# Patient Record
Sex: Female | Born: 1975
Health system: Southern US, Community
[De-identification: ages and names within clinical notes are randomized; demographics above are authoritative.]

## PROBLEM LIST (undated history)

## (undated) DIAGNOSIS — I10 Essential (primary) hypertension: Secondary | ICD-10-CM

## (undated) DIAGNOSIS — C9210 Chronic myeloid leukemia, BCR/ABL-positive, not having achieved remission: Secondary | ICD-10-CM

## (undated) DIAGNOSIS — E669 Obesity, unspecified: Secondary | ICD-10-CM

## (undated) DIAGNOSIS — L700 Acne vulgaris: Secondary | ICD-10-CM

## (undated) HISTORY — DX: Obesity, unspecified: E66.9

## (undated) HISTORY — DX: Acne vulgaris: L70.0

## (undated) HISTORY — DX: Essential (primary) hypertension: I10

## (undated) HISTORY — PX: BREAST BIOPSY: SHX20

---

## 2001-05-31 ENCOUNTER — Emergency Department (HOSPITAL_COMMUNITY): Admission: EM | Admit: 2001-05-31 | Discharge: 2001-05-31 | Payer: Self-pay | Admitting: Internal Medicine

## 2001-06-10 ENCOUNTER — Encounter (HOSPITAL_COMMUNITY): Admission: RE | Admit: 2001-06-10 | Discharge: 2001-07-10 | Payer: Self-pay | Admitting: Preventative Medicine

## 2001-07-10 ENCOUNTER — Other Ambulatory Visit: Admission: RE | Admit: 2001-07-10 | Discharge: 2001-07-10 | Payer: Self-pay | Admitting: Family Medicine

## 2001-07-17 ENCOUNTER — Encounter: Payer: Self-pay | Admitting: Preventative Medicine

## 2001-07-17 ENCOUNTER — Ambulatory Visit (HOSPITAL_COMMUNITY): Admission: RE | Admit: 2001-07-17 | Discharge: 2001-07-17 | Payer: Self-pay | Admitting: Preventative Medicine

## 2004-04-03 ENCOUNTER — Emergency Department (HOSPITAL_COMMUNITY): Admission: EM | Admit: 2004-04-03 | Discharge: 2004-04-04 | Payer: Self-pay | Admitting: Emergency Medicine

## 2005-12-18 ENCOUNTER — Ambulatory Visit: Payer: Self-pay | Admitting: Family Medicine

## 2005-12-23 ENCOUNTER — Emergency Department (HOSPITAL_COMMUNITY): Admission: EM | Admit: 2005-12-23 | Discharge: 2005-12-23 | Payer: Self-pay | Admitting: Emergency Medicine

## 2006-05-23 ENCOUNTER — Ambulatory Visit: Payer: Self-pay | Admitting: Family Medicine

## 2006-10-29 ENCOUNTER — Ambulatory Visit: Payer: Self-pay | Admitting: Family Medicine

## 2007-02-11 ENCOUNTER — Emergency Department (HOSPITAL_COMMUNITY): Admission: EM | Admit: 2007-02-11 | Discharge: 2007-02-11 | Payer: Self-pay | Admitting: Emergency Medicine

## 2007-11-06 ENCOUNTER — Emergency Department (HOSPITAL_COMMUNITY): Admission: EM | Admit: 2007-11-06 | Discharge: 2007-11-06 | Payer: Self-pay | Admitting: *Deleted

## 2007-11-12 ENCOUNTER — Ambulatory Visit: Payer: Self-pay | Admitting: Family Medicine

## 2007-11-12 ENCOUNTER — Other Ambulatory Visit: Admission: RE | Admit: 2007-11-12 | Discharge: 2007-11-12 | Payer: Self-pay | Admitting: Family Medicine

## 2007-11-12 ENCOUNTER — Encounter (INDEPENDENT_AMBULATORY_CARE_PROVIDER_SITE_OTHER): Payer: Self-pay | Admitting: *Deleted

## 2007-11-12 ENCOUNTER — Encounter: Payer: Self-pay | Admitting: Family Medicine

## 2007-11-12 LAB — CONVERTED CEMR LAB
BUN: 13 mg/dL (ref 6–23)
Basophils Relative: 0 % (ref 0–1)
Calcium: 9.3 mg/dL (ref 8.4–10.5)
Cholesterol: 145 mg/dL (ref 0–200)
Creatinine, Ser: 0.81 mg/dL (ref 0.40–1.20)
HDL: 51 mg/dL (ref >39)
LDL Cholesterol: 77 mg/dL (ref 0–99)
MCHC: 32.8 g/dL (ref 30.0–36.0)
Monocytes Relative: 5 % (ref 3–12)
Neutro Abs: 3.9 10*3/uL (ref 1.7–7.7)
Neutrophils Relative %: 63 % (ref 43–77)
Pap Smear: NORMAL
RBC: 3.94 M/uL (ref 3.87–5.11)
Triglycerides: 86 mg/dL (ref <150)
WBC: 6.2 10*3/uL (ref 4.0–10.5)

## 2007-11-14 ENCOUNTER — Encounter: Payer: Self-pay | Admitting: Family Medicine

## 2007-11-14 LAB — CONVERTED CEMR LAB: Chlamydia, DNA Probe: NEGATIVE

## 2008-02-17 ENCOUNTER — Ambulatory Visit: Payer: Self-pay | Admitting: Family Medicine

## 2008-02-27 ENCOUNTER — Encounter (INDEPENDENT_AMBULATORY_CARE_PROVIDER_SITE_OTHER): Payer: Self-pay | Admitting: *Deleted

## 2008-02-27 DIAGNOSIS — I10 Essential (primary) hypertension: Secondary | ICD-10-CM | POA: Insufficient documentation

## 2008-02-27 DIAGNOSIS — L708 Other acne: Secondary | ICD-10-CM

## 2008-02-27 HISTORY — DX: Essential (primary) hypertension: I10

## 2008-05-18 ENCOUNTER — Encounter: Payer: Self-pay | Admitting: Family Medicine

## 2008-05-18 LAB — CONVERTED CEMR LAB: hCG, Beta Chain, Quant, S: 80330.2 milliintl units/mL

## 2008-09-03 ENCOUNTER — Ambulatory Visit: Payer: Self-pay | Admitting: Family Medicine

## 2008-09-03 DIAGNOSIS — R5381 Other malaise: Secondary | ICD-10-CM

## 2008-09-03 DIAGNOSIS — R079 Chest pain, unspecified: Secondary | ICD-10-CM | POA: Insufficient documentation

## 2008-09-03 DIAGNOSIS — R5383 Other fatigue: Secondary | ICD-10-CM

## 2008-09-29 ENCOUNTER — Ambulatory Visit: Payer: Self-pay | Admitting: Family Medicine

## 2008-09-29 DIAGNOSIS — R51 Headache: Secondary | ICD-10-CM

## 2008-09-29 DIAGNOSIS — R519 Headache, unspecified: Secondary | ICD-10-CM | POA: Insufficient documentation

## 2008-10-04 ENCOUNTER — Ambulatory Visit: Payer: Self-pay | Admitting: Cardiology

## 2008-10-04 ENCOUNTER — Encounter: Payer: Self-pay | Admitting: Family Medicine

## 2009-03-30 ENCOUNTER — Telehealth: Payer: Self-pay | Admitting: Family Medicine

## 2009-08-04 ENCOUNTER — Telehealth: Payer: Self-pay | Admitting: Family Medicine

## 2009-08-08 ENCOUNTER — Ambulatory Visit: Payer: Self-pay | Admitting: Family Medicine

## 2009-08-08 DIAGNOSIS — N760 Acute vaginitis: Secondary | ICD-10-CM

## 2009-08-09 ENCOUNTER — Encounter: Payer: Self-pay | Admitting: Family Medicine

## 2009-08-09 LAB — CONVERTED CEMR LAB
Candida species: NEGATIVE
Chlamydia, DNA Probe: NEGATIVE
Gardnerella vaginalis: POSITIVE — AB

## 2009-08-14 DIAGNOSIS — E669 Obesity, unspecified: Secondary | ICD-10-CM

## 2009-09-26 ENCOUNTER — Encounter: Payer: Self-pay | Admitting: Family Medicine

## 2009-09-26 LAB — CONVERTED CEMR LAB
BUN: 12 mg/dL (ref 6–23)
Basophils Relative: 0 % (ref 0–1)
CO2: 24 meq/L (ref 19–32)
Chloride: 107 meq/L (ref 96–112)
Creatinine, Ser: 0.85 mg/dL (ref 0.40–1.20)
Eosinophils Absolute: 0.1 10*3/uL (ref 0.0–0.7)
Eosinophils Relative: 2 % (ref 0–5)
Glucose, Bld: 79 mg/dL (ref 70–99)
HDL: 43 mg/dL (ref >39)
Hemoglobin: 11.8 g/dL — ABNORMAL LOW (ref 12.0–15.0)
Lymphs Abs: 1.6 10*3/uL (ref 0.7–4.0)
MCHC: 31 g/dL (ref 30.0–36.0)
MCV: 96.7 fL (ref 78.0–100.0)
Monocytes Absolute: 0.4 10*3/uL (ref 0.1–1.0)
Monocytes Relative: 7 % (ref 3–12)
RBC: 3.94 M/uL (ref 3.87–5.11)
Total CHOL/HDL Ratio: 3.4
VLDL: 16 mg/dL (ref 0–40)

## 2009-10-04 ENCOUNTER — Ambulatory Visit: Payer: Self-pay | Admitting: Family Medicine

## 2009-10-04 ENCOUNTER — Other Ambulatory Visit: Admission: RE | Admit: 2009-10-04 | Discharge: 2009-10-04 | Payer: Self-pay | Admitting: Family Medicine

## 2009-11-17 ENCOUNTER — Telehealth: Payer: Self-pay | Admitting: Family Medicine

## 2009-11-29 ENCOUNTER — Emergency Department (HOSPITAL_COMMUNITY): Admission: EM | Admit: 2009-11-29 | Discharge: 2009-11-30 | Payer: Self-pay | Admitting: Emergency Medicine

## 2009-12-05 ENCOUNTER — Encounter (INDEPENDENT_AMBULATORY_CARE_PROVIDER_SITE_OTHER): Payer: Self-pay

## 2009-12-05 ENCOUNTER — Ambulatory Visit: Payer: Self-pay | Admitting: Family Medicine

## 2009-12-05 DIAGNOSIS — M542 Cervicalgia: Secondary | ICD-10-CM

## 2009-12-07 ENCOUNTER — Encounter: Payer: Self-pay | Admitting: Family Medicine

## 2009-12-07 ENCOUNTER — Ambulatory Visit (HOSPITAL_COMMUNITY): Admission: RE | Admit: 2009-12-07 | Discharge: 2009-12-07 | Payer: Self-pay | Admitting: Family Medicine

## 2009-12-27 ENCOUNTER — Encounter (HOSPITAL_COMMUNITY): Admission: RE | Admit: 2009-12-27 | Discharge: 2010-01-26 | Payer: Self-pay | Admitting: Family Medicine

## 2010-01-09 ENCOUNTER — Encounter: Payer: Self-pay | Admitting: Family Medicine

## 2010-11-15 ENCOUNTER — Telehealth: Payer: Self-pay | Admitting: Family Medicine

## 2010-12-01 ENCOUNTER — Telehealth: Payer: Self-pay | Admitting: Family Medicine

## 2010-12-10 LAB — CONVERTED CEMR LAB
BUN: 13 mg/dL (ref 6–23)
Calcium: 8.7 mg/dL (ref 8.4–10.5)
Cholesterol: 147 mg/dL (ref 0–200)
Creatinine, Ser: 0.81 mg/dL (ref 0.40–1.20)
Glucose, Bld: 81 mg/dL (ref 70–99)
HCT: 36.6 % (ref 36.0–46.0)
Hemoglobin: 11.6 g/dL — ABNORMAL LOW (ref 12.0–15.0)
MCHC: 31.7 g/dL (ref 30.0–36.0)
MCV: 95.6 fL (ref 78.0–100.0)
RBC: 3.83 M/uL — ABNORMAL LOW (ref 3.87–5.11)

## 2010-12-11 ENCOUNTER — Emergency Department (HOSPITAL_COMMUNITY)
Admission: EM | Admit: 2010-12-11 | Discharge: 2010-12-11 | Payer: Self-pay | Source: Home / Self Care | Admitting: Emergency Medicine

## 2010-12-14 NOTE — Letter (Signed)
Summary: Out of Work  [facility]  [address]   [place], [place] [address]   Phone: [phone]  Fax: [phone]    December 05, 2009   Employee:  SERYNA MAREK Ninamarie Keel    To Whom It May Concern:   For Medical reasons, please excuse the above named employee from work for the following dates:  Start:   11/29/09  End:   12/12/09 to return with no restrictions  If you need additional information, please feel free to contact our office.         Sincerely,    Milus Mallick. Lodema Hong, MD

## 2010-12-14 NOTE — Progress Notes (Signed)
  Phone Note Call from Patient   Summary of Call: [facility] requesting Ibuprofen 800 mg 1 two times a day prn Initial call taken by: Everitt Amber,  November 17, 2009 4:35 PM  Follow-up for Phone Call        script sent in as requested for menstrualcramps Follow-up by: Syliva Overman MD,  November 17, 2009 5:18 PM    New/Updated Medications: IBUPROFEN 800 MG TABS (IBUPROFEN) Take 1 tablet by mouth two times a day as needed Prescriptions: IBUPROFEN 800 MG TABS (IBUPROFEN) Take 1 tablet by mouth two times a day as needed  #40 x 1   Entered and Authorized by:   Syliva Overman MD   Signed by:   Syliva Overman MD on 11/17/2009   Method used:   Electronically to        [facility], [facility] (retail)       [address]       [place], [place]  [address]       Ph: [phone]       Fax: [phone]   RxID:   [id number]

## 2010-12-14 NOTE — Progress Notes (Signed)
Summary: physical therapy  physical therapy   Imported By: Lind Guest 01/09/2010 09:14:19  _____________________________________________________________________  External Attachment:    Type:   Image     Comment:   External Document

## 2010-12-14 NOTE — Letter (Signed)
Summary: Out of Work  [facility]  [address]   [place], [place] [address]   Phone: [phone]  Fax: [phone]    December 05, 2009   Employee:  KIFFANY SCHELLING SIMPSON    To Whom It May Concern:   For Medical reasons, please excuse the above named employee from work for the following dates:  Start:   11/29/2009  End:   12/12/2009  If you need additional information, please feel free to contact our office.         Sincerely,    Milus Mallick. Lodema Hong, M.D.

## 2010-12-14 NOTE — Progress Notes (Signed)
Summary: mmr2  Phone Note Call from Patient   Summary of Call: wants to know do we give the mmr2 shot jaime said to tell her to call back monday so i did Initial call taken by: Lind Guest,  November 15, 2010 2:38 PM  Follow-up for Phone Call        If she requires this it can be given here,pls find out why she needs it , then let me know Follow-up by: Syliva Overman MD,  November 22, 2010 4:48 AM  Additional Follow-up for Phone Call Additional follow up Details #1::        called patient, left message Additional Follow-up by: Adella Hare LPN,  November 22, 2010 9:36 AM  New Problems: NEED PROPH VACC W/MEASLES-MUMPS-RUBELLA VACCINE (ICD-V06.4)   Additional Follow-up for Phone Call Additional follow up Details #2::    needs mmr2 or titer for school Follow-up by: Adella Hare LPN,  November 22, 2010 9:43 AM  Additional Follow-up for Phone Call Additional follow up Details #3:: Details for Additional Follow-up Action Taken: pls order MMR titer on pt, blood test, let her know if the titer shows she is immune she will no need #2 Additional Follow-up by: Syliva Overman MD,  November 22, 2010 10:22 AM  New Problems: NEED PROPH [facility] W/MEASLES-MUMPS-RUBELLA VACCINE (ICD-V06.4)  test ordered and called patient, left message Adella Hare LPN  November 22, 2010 4:18 PM  called patient, left message Adella Hare LPN  November 27, 2010 8:40 AM  patient aware Adella Hare LPN  November 28, 2010 8:35 AM

## 2010-12-14 NOTE — Letter (Signed)
Summary: FMLA PAPER  FMLA PAPER   Imported By: Lind Guest 12/07/2009 09:07:22  _____________________________________________________________________  External Attachment:    Type:   Image     Comment:   External Document

## 2010-12-14 NOTE — Progress Notes (Signed)
Summary: RESULTS  Phone Note Call from Patient   Summary of Call: WANTS HER RESULTS THE TITER SEND TO [phone] [facility] [employer]  Initial call taken by: Lind Guest,  December 01, 2010 10:06 AM  Follow-up for Phone Call        faxed as requested Follow-up by: Adella Hare LPN,  December 01, 2010 2:22 PM
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
## 2011-01-02 ENCOUNTER — Ambulatory Visit: Payer: Self-pay | Admitting: Family Medicine

## 2011-01-02 ENCOUNTER — Encounter: Payer: Self-pay | Admitting: Family Medicine

## 2011-01-09 NOTE — Letter (Signed)
Summary: 1st no show letter  1st no show letter   Imported By: Lind Guest 01/04/2011 11:43:59  _____________________________________________________________________  External Attachment:    Type:   Image     Comment:   External Document

## 2011-01-28 LAB — URINALYSIS, ROUTINE W REFLEX MICROSCOPIC
Bilirubin Urine: NEGATIVE
Glucose, UA: NEGATIVE mg/dL
Leukocytes, UA: NEGATIVE
Protein, ur: NEGATIVE mg/dL

## 2011-01-28 LAB — URINE MICROSCOPIC-ADD ON

## 2011-02-05 ENCOUNTER — Other Ambulatory Visit: Payer: Self-pay | Admitting: Family Medicine

## 2011-02-08 ENCOUNTER — Other Ambulatory Visit: Payer: Self-pay

## 2011-03-05 ENCOUNTER — Encounter: Payer: Self-pay | Admitting: Family Medicine

## 2011-03-06 ENCOUNTER — Encounter: Payer: Self-pay | Admitting: Family Medicine

## 2011-03-07 ENCOUNTER — Encounter: Payer: Self-pay | Admitting: Family Medicine

## 2011-03-27 NOTE — Assessment & Plan Note (Signed)
[facility]                       [place] CARDIOLOGY OFFICE NOTE   MELVIN, MARMO                     MRN:          [id number]  DATE:10/04/2008                            DOB:          10/06/1976    I was asked by Dr. Syliva Overman to evaluate Lamonte Richer for her  cardiovascular risk with a family history of coronary artery disease and  hypertension.   HISTORY OF PRESENT ILLNESS:  Marcia Hahn is a delightful 35 year old  African American female.  She has a history of hypertension, well  controlled with HCTZ.  She is extremely knowledgeable with diet, taking  in about 750 mg of sodium a day on the 2000-calorie diet.  She balances  her weight, which has been stable for some time.  She tries to exercise  when she can, but she has very little time.   She does not smoke.  She does not have hyperlipidemia.  In fact, her  last cholesterol was 147 with a LDL of 82.  Her HDL is borderline low at  49 and her triglycerides were normal as was her VLDL.   Her family history is pertinent for her father having an MI at 17, which  was fatal.  It was his first event.  However, he smoked.  He was  overweight.  He had hypertension.  He probably had high cholesterol.   PAST MEDICAL HISTORY:  She has no known drug allergies.   CURRENT MEDICATIONS:  HCTZ 25 mg per day.   HOSPITALIZATION:  She had childbirth with twins.  She has had no other  surgeries.   FAMILY HISTORY:  Above.   SOCIAL HISTORY:  She is not married.  She has 2 children.  She works at  [employer].   REVIEW OF SYSTEMS:  She wears glasses.  She has no dentures.  Her GI,  GU, gynecological, and endocrine are all negative.  Cardiorespiratory as  above with no symptoms of angina or ischemia.   PHYSICAL EXAMINATION:  VITAL SIGNS:  Her blood pressure is 128/80 and  her pulse is 80 and regular.  She is 5 feet 4 inches and weighs 175  pounds.  GENERAL:  She is extremely pleasant.   She is in no acute distress.  SKIN:  Warm and dry.  HEENT:  Completely normal.  NECK:  Carotid upstrokes are equal bilaterally without bruits.  No JVD.  Thyroid is not enlarged.  Trachea is midline.  LUNGS:  Clear to auscultation and percussion.  HEART:  Soft S1 and S2.  No gallop.  PMI is difficult to appreciate.  ABDOMEN:  Soft.  Good bowel sounds.  EXTREMITIES:  There is no cyanosis, clubbing, or edema.  Pulses are  intact.  NEUROLOGIC:  Intact.   Electrocardiogram is completely normal.   ASSESSMENT AND PLAN:  I have had a long talk with Ms. Simpson today.  Her current Framingham risk is low as is her Reynolds score.  I am not  sure if she really has a premature family history with her father having  multiple risk factors including being a smoker.  I have reassured her  today that with her healthy lifestyle, she should do very well.   Her current risk is certainly less than 5%, probably in the 2-3% range  over the next 10 years.  Until she becomes 53 or older, assuming all  other risk factors remain stable, her risk will remain low.  She felt  better by our conversation.   I did advise her to walk or do some sort of cardio activity 3 hours a  week.   We will see her back on a p.r.n. basis.   Please note that if her HDL drops further, switching her from HCTZ to an  ACE inhibitor or calcium-channel blocker may indicate some of this.     Thomas C. Daleen Squibb, MD, [facility]  Electronically Signed    TCW/MedQ  DD: 10/04/2008  DT: 10/05/2008  Job #: [id number]   cc:   Milus Mallick. Lodema Hong, M.D.

## 2011-04-16 ENCOUNTER — Encounter: Payer: Self-pay | Admitting: Family Medicine

## 2011-05-28 ENCOUNTER — Encounter: Payer: Self-pay | Admitting: Family Medicine

## 2011-06-05 ENCOUNTER — Other Ambulatory Visit (HOSPITAL_COMMUNITY)
Admission: RE | Admit: 2011-06-05 | Discharge: 2011-06-05 | Disposition: A | Discharge status: Home / Self Care | Payer: BC Managed Care – PPO | Source: Ambulatory Visit | Attending: Family Medicine | Admitting: Family Medicine

## 2011-06-05 ENCOUNTER — Ambulatory Visit (INDEPENDENT_AMBULATORY_CARE_PROVIDER_SITE_OTHER): Payer: BC Managed Care – PPO | Admitting: Family Medicine

## 2011-06-05 ENCOUNTER — Encounter: Payer: Self-pay | Admitting: Family Medicine

## 2011-06-05 VITALS — BP 140/80 | HR 75 | Resp 16 | Ht 65.0 in | Wt 185.0 lb

## 2011-06-05 DIAGNOSIS — Z13220 Encounter for screening for lipoid disorders: Secondary | ICD-10-CM

## 2011-06-05 DIAGNOSIS — R5383 Other fatigue: Secondary | ICD-10-CM

## 2011-06-05 DIAGNOSIS — Z139 Encounter for screening, unspecified: Secondary | ICD-10-CM

## 2011-06-05 DIAGNOSIS — Z124 Encounter for screening for malignant neoplasm of cervix: Secondary | ICD-10-CM

## 2011-06-05 DIAGNOSIS — Z0000 Encounter for general adult medical examination without abnormal findings: Secondary | ICD-10-CM

## 2011-06-05 DIAGNOSIS — R5381 Other malaise: Secondary | ICD-10-CM

## 2011-06-05 DIAGNOSIS — Z01419 Encounter for gynecological examination (general) (routine) without abnormal findings: Secondary | ICD-10-CM | POA: Insufficient documentation

## 2011-06-05 DIAGNOSIS — E663 Overweight: Secondary | ICD-10-CM

## 2011-06-05 DIAGNOSIS — R7301 Impaired fasting glucose: Secondary | ICD-10-CM

## 2011-06-05 DIAGNOSIS — I10 Essential (primary) hypertension: Secondary | ICD-10-CM

## 2011-06-05 DIAGNOSIS — L708 Other acne: Secondary | ICD-10-CM

## 2011-06-05 MED ORDER — POTASSIUM CHLORIDE 10 MEQ PO TBCR: 10.0000 meq | EXTENDED_RELEASE_TABLET | Freq: Every day | ORAL | Status: DC

## 2011-06-05 MED ORDER — IBUPROFEN 800 MG PO TABS: 800.0000 mg | ORAL_TABLET | Freq: Three times a day (TID) | ORAL | Status: DC

## 2011-06-05 MED ORDER — HYDROCHLOROTHIAZIDE 12.5 MG PO CAPS: 12.5000 mg | ORAL_CAPSULE | Freq: Every day | ORAL | Status: DC

## 2011-06-05 MED ORDER — HYDROQUINONE 2 % EX CREA: TOPICAL_CREAM | Freq: Two times a day (BID) | CUTANEOUS | Status: DC

## 2011-06-05 NOTE — Patient Instructions (Addendum)
F/u in December.  CBC, chem 7, HBA1C, lipids , tSH and Vit D today.  pls follow a 1500 cal diet and plan to eat 3 meals and 2 "small meals" eg carrots and apples every day. Drink 64 ounces water daily.  It is important that you exercise regularly at least 45  minutes 5 times a week. If you develop chest pain, have severe difficulty breathing, or feel very tired, stop exercising immediately and seek medical attention   You will be referred for individual counselling for weight loss.  New medication for your blood pressure and also for the dark spots on your face  Weight loss goal is 10 pounds

## 2011-06-24 NOTE — Assessment & Plan Note (Signed)
Deteriorated , with hyperpigmented lesions, med prescribed

## 2011-06-24 NOTE — Assessment & Plan Note (Signed)
Deteriorated. Patient re-educated about  the importance of commitment to a  minimum of 150 minutes of exercise per week. The importance of healthy food choices with portion control discussed. Encouraged to start a food diary, count calories and to consider  joining a support group. Sample diet sheets offered. Goals set by the patient for the next several months.    

## 2011-06-24 NOTE — Progress Notes (Signed)
  Subjective:    Patient ID: Marcia Hahn, female    DOB: 1976/10/07, 35 y.o.   MRN: [id number]  HPI The PT is here for annual  exam and re-evaluation of chronic medical conditions, medication management and review of any available recent lab and radiology data.  Preventive health is updated, specifically  Cancer screening and Immunization.   Questions or concerns regarding consultations or procedures which the PT has had in the interim are  addressed. The PT denies any adverse reactions to current medications since the last visit.  There are no new concerns.  Concerned about dark areas on her face where she has traumatized the areas when she has had outbreak of her acne. She is also concerned about her weight and intends to reverse the weight gain trend      Review of Systems Denies recent fever or chills. Denies sinus pressure, nasal congestion, ear pain or sore throat. Denies chest congestion, productive cough or wheezing. Denies chest pains, palpitations and leg swelling Denies abdominal pain, nausea, vomiting,diarrhea or constipation.   Denies dysuria, frequency, hesitancy or incontinence. Denies joint pain, swelling and limitation in mobility. Denies headaches, seizures, numbness, or tingling. Denies depression, anxiety or insomnia.       Objective:   Physical Exam Pleasant well nourished female, alert and oriented x 3, in no cardio-pulmonary distress. Afebrile. HEENT No facial trauma or asymetry. Sinuses non tender.  EOMI, PERTL, fundoscopic exam is normal, no hemorhage or exudate.  External ears normal, tympanic membranes clear. Oropharynx moist, no exudate, good dentition. Neck: supple, no adenopathy,JVD or thyromegaly.No bruits.  Chest: Clear to ascultation bilaterally.No crackles or wheezes. Non tender to palpation  Breast: No asymetry,no masses. No nipple discharge or inversion. No axillary or supraclavicular adenopathy  Cardiovascular system; Heart  sounds normal,  S1 and  S2 ,no S3.  No murmur, or thrill. Apical beat not displaced Peripheral pulses normal.  Abdomen: Soft, non tender, no organomegaly or masses. No bruits. Bowel sounds normal. No guarding, tenderness or rebound.   GU: External genitalia normal. No lesions. Vaginal canal normal.No discharge. Uterus normal size, no adnexal masses, no cervical motion or adnexal tenderness.  Musculoskeletal exam: Full ROM of spine, hips , shoulders and knees. No deformity ,swelling or crepitus noted. No muscle wasting or atrophy.   Neurologic: Cranial nerves 2 to 12 intact. Power, tone ,sensation and reflexes normal throughout. No disturbance in gait. No tremor.  Skin: Intact, no ulceration, erythema , scaling or rash noted. Hyperpigmented macular lesions on face in scars from acne Psych; Normal mood and affect. Judgement and concentration normal        Assessment & Plan:

## 2011-06-24 NOTE — Assessment & Plan Note (Signed)
Medication compliance addressed. Commitment to regular exercise and healthy  food choices, with portion control discussed. DASH diet and low fat diet discussed and literature offered. Changes in medication made at this visit.  

## 2011-06-30 LAB — CBC WITH DIFFERENTIAL/PLATELET
Eosinophils Absolute: 0.1 10*3/uL (ref 0.0–0.7)
Eosinophils Relative: 2 % (ref 0–5)
Hemoglobin: 11.5 g/dL — ABNORMAL LOW (ref 12.0–15.0)
Lymphocytes Relative: 28 % (ref 12–46)
MCHC: 31.9 g/dL (ref 30.0–36.0)
Monocytes Absolute: 0.3 10*3/uL (ref 0.1–1.0)
Monocytes Relative: 6 % (ref 3–12)
Neutro Abs: 3.3 10*3/uL (ref 1.7–7.7)
Neutrophils Relative %: 65 % (ref 43–77)
RDW: 14.3 % (ref 11.5–15.5)
WBC: 5.1 10*3/uL (ref 4.0–10.5)

## 2011-07-01 LAB — BASIC METABOLIC PANEL
CO2: 25 mEq/L (ref 19–32)
Chloride: 105 mEq/L (ref 96–112)
Potassium: 4 mEq/L (ref 3.5–5.3)
Sodium: 138 mEq/L (ref 135–145)

## 2011-07-01 LAB — LIPID PANEL
Cholesterol: 153 mg/dL (ref 0–200)
Triglycerides: 104 mg/dL (ref <150)

## 2011-07-02 ENCOUNTER — Telehealth: Payer: Self-pay | Admitting: Family Medicine

## 2011-07-02 DIAGNOSIS — I10 Essential (primary) hypertension: Secondary | ICD-10-CM

## 2011-07-02 DIAGNOSIS — L708 Other acne: Secondary | ICD-10-CM

## 2011-07-02 MED ORDER — HYDROCHLOROTHIAZIDE 12.5 MG PO CAPS: 12.5000 mg | ORAL_CAPSULE | Freq: Every day | ORAL | Status: DC

## 2011-07-02 MED ORDER — HYDROQUINONE 2 % EX CREA: TOPICAL_CREAM | Freq: Two times a day (BID) | CUTANEOUS | Status: DC

## 2011-07-02 NOTE — Telephone Encounter (Signed)
Was sent in last month. Resent in again

## 2011-07-09 ENCOUNTER — Ambulatory Visit (INDEPENDENT_AMBULATORY_CARE_PROVIDER_SITE_OTHER): Payer: BC Managed Care – PPO | Admitting: Family Medicine

## 2011-07-09 ENCOUNTER — Encounter: Payer: Self-pay | Admitting: Family Medicine

## 2011-07-09 VITALS — BP 120/70 | HR 87 | Ht 66.0 in | Wt 189.0 lb

## 2011-07-09 DIAGNOSIS — L259 Unspecified contact dermatitis, unspecified cause: Secondary | ICD-10-CM

## 2011-07-09 DIAGNOSIS — E663 Overweight: Secondary | ICD-10-CM

## 2011-07-09 DIAGNOSIS — L309 Dermatitis, unspecified: Secondary | ICD-10-CM

## 2011-07-09 DIAGNOSIS — I10 Essential (primary) hypertension: Secondary | ICD-10-CM

## 2011-07-09 MED ORDER — PREDNISONE (PAK) 5 MG PO TABS: 5.0000 mg | ORAL_TABLET | ORAL | Status: DC

## 2011-07-09 NOTE — Patient Instructions (Addendum)
F/u as before.  You have dermatitis, which  I think is due to excessive sunlight, theerfore you need to avoid and use sun screen  Prednisone is being prescribed  Use my fit pal for calorie counting  Call for referral to allergist if you desire

## 2011-07-21 NOTE — Assessment & Plan Note (Signed)
Unchanged. Patient re-educated about  the importance of commitment to a  minimum of 150 minutes of exercise per week. The importance of healthy food choices with portion control discussed. Encouraged to start a food diary, count calories and to consider  joining a support group. Sample diet sheets offered. Goals set by the patient for the next several months.    

## 2011-07-21 NOTE — Progress Notes (Signed)
  Subjective:    Patient ID: Marcia Hahn, female    DOB: 1976/01/04, 35 y.o.   MRN: [id number]  HPI 1 week h/o rash to both arms as well as the legs, recently [place] the beach with excessive sun exposure. No new toiletries or detergents. No fever, chills or drainage from the rash. Has noted similar rash when in the sun in the past. Exercising, but no commitment to calorie restriction with resultant weight gain.     Review of Systems See HPI Denies recent fever or chills. Denies sinus pressure, nasal congestion, ear pain or sore throat. Denies chest congestion, productive cough or wheezing. Denies chest pains, palpitations and leg swelling Denies abdominal pain, nausea, vomiting,diarrhea or constipation.         Objective:   Physical Exam Patient alert and oriented and in no cardiopulmonary distress.  HEENT: No facial asymmetry, EOMI,  Neck supple no adenopathy.  Chest: Clear to auscultation bilaterally.  CVS: S1, S2 no murmurs, no S3.  ABD: Soft non tender. Bowel sounds normal.  Ext: No edema  MS: Adequate ROM spine, shoulders, hips and knees.  Skin: Intact,maculopapular rash on extremities      Assessment & Plan:

## 2011-07-21 NOTE — Assessment & Plan Note (Signed)
Controlled, no change in medication  

## 2011-07-21 NOTE — Assessment & Plan Note (Signed)
Sun sensitivity by history with rash, prednisone dose pack and avoidance

## 2011-09-12 ENCOUNTER — Telehealth: Payer: Self-pay | Admitting: Family Medicine

## 2011-09-13 NOTE — Telephone Encounter (Signed)
Returned call, no answer.

## 2011-09-13 NOTE — Telephone Encounter (Signed)
Patient wants referral to gyne to have remove iud, family tree okay

## 2011-09-14 ENCOUNTER — Other Ambulatory Visit: Payer: Self-pay | Admitting: Family Medicine

## 2011-09-14 NOTE — Telephone Encounter (Signed)
pls refer, referral entered

## 2011-09-14 NOTE — Telephone Encounter (Signed)
Pt was referred to dr. Robinette Haines office. They will call pt with appt and time.

## 2011-10-30 ENCOUNTER — Encounter: Payer: Self-pay | Admitting: Family Medicine

## 2011-11-01 ENCOUNTER — Ambulatory Visit: Payer: BC Managed Care – PPO | Admitting: Family Medicine

## 2012-03-03 ENCOUNTER — Other Ambulatory Visit: Payer: Self-pay | Admitting: Family Medicine

## 2012-04-10 ENCOUNTER — Other Ambulatory Visit: Payer: Self-pay | Admitting: Family Medicine

## 2012-04-14 ENCOUNTER — Ambulatory Visit (INDEPENDENT_AMBULATORY_CARE_PROVIDER_SITE_OTHER): Payer: BC Managed Care – PPO | Admitting: Family Medicine

## 2012-04-14 ENCOUNTER — Encounter: Payer: Self-pay | Admitting: Family Medicine

## 2012-04-14 VITALS — BP 140/90 | HR 80 | Resp 14 | Ht 66.0 in | Wt 191.1 lb

## 2012-04-14 DIAGNOSIS — R5383 Other fatigue: Secondary | ICD-10-CM

## 2012-04-14 DIAGNOSIS — Z125 Encounter for screening for malignant neoplasm of prostate: Secondary | ICD-10-CM

## 2012-04-14 DIAGNOSIS — H538 Other visual disturbances: Secondary | ICD-10-CM

## 2012-04-14 DIAGNOSIS — Z139 Encounter for screening, unspecified: Secondary | ICD-10-CM

## 2012-04-14 DIAGNOSIS — R5381 Other malaise: Secondary | ICD-10-CM

## 2012-04-14 DIAGNOSIS — E663 Overweight: Secondary | ICD-10-CM

## 2012-04-14 DIAGNOSIS — R7301 Impaired fasting glucose: Secondary | ICD-10-CM

## 2012-04-14 DIAGNOSIS — R0989 Other specified symptoms and signs involving the circulatory and respiratory systems: Secondary | ICD-10-CM | POA: Insufficient documentation

## 2012-04-14 DIAGNOSIS — I10 Essential (primary) hypertension: Secondary | ICD-10-CM

## 2012-04-14 MED ORDER — IBUPROFEN 800 MG PO TABS: 800.0000 mg | ORAL_TABLET | Freq: Three times a day (TID) | ORAL | Status: DC

## 2012-04-14 NOTE — Progress Notes (Signed)
  Subjective:    Patient ID: Marcia Hahn, female    DOB: 07-10-1976, 36 y.o.   MRN: [id number]  HPI Ptwas in her ususal state of health up until she was told in April during a routine eye exam that she has papilededma. States there is some family h/o glaucoma. States she has since had a brain scan which was negative, and has been recommended to a neurologist, however she wants to see a Doc at another facility and is here for help with this. Records from the opthalmologist support this history, and she is being referred to a tertiary center for a 2nd opinion and further evaluation and treatment as deemed necessary. She has otherwise been well.There is no known family h/o neurologic disease or visual disturbance   Review of Systems    See HPI Denies recent fever or chills. Denies sinus pressure, nasal congestion, ear pain or sore throat. Denies chest congestion, productive cough or wheezing. Denies chest pains, palpitations and leg swelling Denies abdominal pain, nausea, vomiting,diarrhea or constipation.   Denies dysuria, frequency, hesitancy or incontinence. Denies joint pain, swelling and limitation in mobility. Denies headaches, seizures, numbness, or tingling. Denies depression, anxiety or insomnia. Denies skin break down or rash.      Objective:   Physical Exam Patient alert and oriented and in no cardiopulmonary distress.  HEENT: No facial asymmetry, EOMI, no sinus tenderness,  oropharynx pink and moist.  Neck supple no adenopathy.Bruit  Chest: Clear to auscultation bilaterally.  CVS: S1, S2 no murmurs, no S3.  ABD: Soft non tender. Bowel sounds normal.  Ext: No edema  MS: Adequate ROM spine, shoulders, hips and knees.  Skin: Intact, no ulcerations or rash noted.  Psych: Good eye contact, normal affect. Memory intact not anxious or depressed appearing.  CNS: CN 2-12 intact, power, tone and sensation normal throughout.        Assessment & Plan:

## 2012-04-14 NOTE — Patient Instructions (Addendum)
CPE end July  Please take weekly vit D  Cbc, fasting chem 7 , lipid, , HBa1C and TSH end July bEFORE visit.  You are referred for an Ultrasound of the arteries in your neck to ensure circulation is good, pls schedule your own appointment per your directions  You will be referred to [facility] for 2nd opinion on diagnosis and further management of papiledema  It is important that you exercise regularly at least 30 minutes 5 times a week. If you develop chest pain, have severe difficulty breathing, or feel very tired, stop exercising immediately and seek medical attention  .A healthy diet is rich in fruit, vegetables and whole grains. Poultry fish, nuts and beans are a healthy choice for protein rather then red meat. A low sodium diet and drinking 64 ounces of water daily is generally recommended. Oils and sweet should be limited. Carbohydrates especially for those who are diabetic or overweight, should be limited to 34-45 gram per meal. It is important to eat on a regular schedule, at least 3 times daily. Snacks should be primarily fruits, vegetables or nuts.

## 2012-04-22 ENCOUNTER — Other Ambulatory Visit (HOSPITAL_COMMUNITY): Payer: BC Managed Care – PPO

## 2012-04-22 ENCOUNTER — Ambulatory Visit (HOSPITAL_COMMUNITY)
Admission: RE | Admit: 2012-04-22 | Discharge: 2012-04-22 | Disposition: A | Discharge status: Home / Self Care | Payer: BC Managed Care – PPO | Source: Ambulatory Visit | Attending: Family Medicine | Admitting: Family Medicine

## 2012-04-22 DIAGNOSIS — I10 Essential (primary) hypertension: Secondary | ICD-10-CM | POA: Insufficient documentation

## 2012-04-22 DIAGNOSIS — R0989 Other specified symptoms and signs involving the circulatory and respiratory systems: Secondary | ICD-10-CM | POA: Insufficient documentation

## 2012-04-22 DIAGNOSIS — H538 Other visual disturbances: Secondary | ICD-10-CM | POA: Insufficient documentation

## 2012-05-02 NOTE — Assessment & Plan Note (Addendum)
Pt to have carotid doppler to further eval this, she will schedule her own appt per recommendations of her company

## 2012-05-02 NOTE — Assessment & Plan Note (Signed)
Controlled, no change in medication  

## 2012-05-02 NOTE — Assessment & Plan Note (Signed)
Recent dx of papiledema, with blurred vision and normal mRI brain, refer to tertiary center for further eval

## 2012-05-02 NOTE — Assessment & Plan Note (Signed)
Deteriorated. Patient re-educated about  the importance of commitment to a  minimum of 150 minutes of exercise per week. The importance of healthy food choices with portion control discussed. Encouraged to start a food diary, count calories and to consider  joining a support group. Sample diet sheets offered. Goals set by the patient for the next several months.    

## 2012-05-19 ENCOUNTER — Other Ambulatory Visit: Payer: Self-pay | Admitting: Family Medicine

## 2012-05-19 LAB — CBC WITH DIFFERENTIAL/PLATELET
Basophils Absolute: 0 10*3/uL (ref 0.0–0.1)
Eosinophils Absolute: 0.1 10*3/uL (ref 0.0–0.7)
Eosinophils Relative: 2 % (ref 0–5)
HCT: 30.6 % — ABNORMAL LOW (ref 36.0–46.0)
Lymphocytes Relative: 31 % (ref 12–46)
MCH: 27.9 pg (ref 26.0–34.0)
MCHC: 33 g/dL (ref 30.0–36.0)
MCV: 84.5 fL (ref 78.0–100.0)
Monocytes Absolute: 0.3 10*3/uL (ref 0.1–1.0)
Platelets: 321 10*3/uL (ref 150–400)
RDW: 16.2 % — ABNORMAL HIGH (ref 11.5–15.5)
WBC: 5.2 10*3/uL (ref 4.0–10.5)

## 2012-05-19 LAB — LIPID PANEL
LDL Cholesterol: 90 mg/dL (ref 0–99)
Triglycerides: 107 mg/dL (ref <150)

## 2012-05-19 LAB — TSH: TSH: 1.548 u[IU]/mL (ref 0.350–4.500)

## 2012-05-19 LAB — BASIC METABOLIC PANEL
BUN: 11 mg/dL (ref 6–23)
Calcium: 9 mg/dL (ref 8.4–10.5)
Chloride: 108 mEq/L (ref 96–112)
Creat: 0.78 mg/dL (ref 0.50–1.10)

## 2012-05-19 LAB — HEMOGLOBIN A1C: Hgb A1c MFr Bld: 6.1 % — ABNORMAL HIGH (ref <5.7)

## 2012-05-20 LAB — IRON: Iron: 38 ug/dL — ABNORMAL LOW (ref 42–145)

## 2012-06-02 ENCOUNTER — Other Ambulatory Visit (HOSPITAL_COMMUNITY)
Admission: RE | Admit: 2012-06-02 | Discharge: 2012-06-02 | Disposition: A | Discharge status: Home / Self Care | Payer: BC Managed Care – PPO | Source: Ambulatory Visit | Attending: Family Medicine | Admitting: Family Medicine

## 2012-06-02 ENCOUNTER — Encounter: Payer: Self-pay | Admitting: Family Medicine

## 2012-06-02 ENCOUNTER — Ambulatory Visit (INDEPENDENT_AMBULATORY_CARE_PROVIDER_SITE_OTHER): Payer: BC Managed Care – PPO | Admitting: Family Medicine

## 2012-06-02 VITALS — BP 132/86 | HR 100 | Resp 18 | Ht 66.0 in | Wt 186.1 lb

## 2012-06-02 DIAGNOSIS — I10 Essential (primary) hypertension: Secondary | ICD-10-CM

## 2012-06-02 DIAGNOSIS — R5381 Other malaise: Secondary | ICD-10-CM

## 2012-06-02 DIAGNOSIS — Z01419 Encounter for gynecological examination (general) (routine) without abnormal findings: Secondary | ICD-10-CM

## 2012-06-02 DIAGNOSIS — H4710 Unspecified papilledema: Secondary | ICD-10-CM

## 2012-06-02 NOTE — Patient Instructions (Addendum)
F/u in 3.5 month  Vit D, hBA1C, cbc , iron and ferritin level before visit  Please keep appt with neurologist regarding papilledema  You may call if you wish to be referred to gyne re heavy menses following IUD insertion  Pls call and register for diabetes class, you are prediabetic , need to change eating, count carbs and exercise to reduce your risk of becoming diabetic.  You will need to start slow iron  (slow fe) one twice daily, this is over the counter, you are extremely iron deficient  You need to take one multivitamin once daily  You need to take OTC vit D 600IU once daily  Please start walking for 30 minutes 3 days per week, then increase slowly to 6 days per week as tolerated

## 2012-06-17 DIAGNOSIS — H4710 Unspecified papilledema: Secondary | ICD-10-CM | POA: Insufficient documentation

## 2012-06-17 NOTE — Progress Notes (Signed)
  Subjective:    Patient ID: Marcia Hahn, female    DOB: [birth year], 36 y.o.   MRN: [id number]  HPI The PT is here for annual exam and re-evaluation of chronic medical conditions, medication management and review of any available recent lab and radiology data.  Preventive health is updated, specifically  Cancer screening and Immunization.   Questions or concerns regarding consultations or procedures which the PT has had in the interim are  Addressed.Still has to keep appt with neurologist re dx of papiledema The PT denies any adverse reactions to current medications since the last visit.  There are no new concerns.  There are no specific complaints       Review of Systems See HPI Denies recent fever or chills. Denies sinus pressure, nasal congestion, ear pain or sore throat. Denies chest congestion, productive cough or wheezing. Denies chest pains, palpitations and leg swelling Denies abdominal pain, nausea, vomiting,diarrhea or constipation.   Denies dysuria, frequency, hesitancy or incontinence. Denies joint pain, swelling and limitation in mobility. Denies headaches, seizures, numbness, or tingling. Denies depression, anxiety or insomnia. Denies skin break down or rash.        Objective:   Physical Exam  Pleasant well nourished female, alert and oriented x 3, in no cardio-pulmonary distress. Afebrile. HEENT No facial trauma or asymetry. Sinuses non tender.  EOMI, PERTL, fundoscopic exam is normal, no hemorhage or exudate.  External ears normal, tympanic membranes clear. Oropharynx moist, no exudate, good dentition. Neck: supple, no adenopathy,JVD or thyromegaly.No bruits.  Chest: Clear to ascultation bilaterally.No crackles or wheezes. Non tender to palpation  Breast: No asymetry,no masses. No nipple discharge or inversion. No axillary or supraclavicular adenopathy  Cardiovascular system; Heart sounds normal,  S1 and  S2 ,no S3.  No murmur, or  thrill. Apical beat not displaced Peripheral pulses normal.  Abdomen: Soft, non tender, no organomegaly or masses. No bruits. Bowel sounds normal. No guarding, tenderness or rebound.   GU: External genitalia normal. No lesions. Vaginal canal normal.fishy discharge. Uterus normal size, no adnexal masses, no cervical motion or adnexal tenderness.  Musculoskeletal exam: Full ROM of spine, hips , shoulders and knees. No deformity ,swelling or crepitus noted. No muscle wasting or atrophy.   Neurologic: Cranial nerves 2 to 12 intact. Power, tone ,sensation and reflexes normal throughout. No disturbance in gait. No tremor.  Skin: Intact, no ulceration, erythema , scaling or rash noted. Pigmentation normal throughout  Psych; Normal mood and affect. Judgement and concentration normal       Assessment & Plan:

## 2012-07-07 DIAGNOSIS — H4710 Unspecified papilledema: Secondary | ICD-10-CM

## 2012-07-07 HISTORY — DX: Unspecified papilledema: H47.10

## 2012-07-27 ENCOUNTER — Encounter (HOSPITAL_COMMUNITY): Payer: Self-pay | Admitting: *Deleted

## 2012-07-27 ENCOUNTER — Emergency Department (HOSPITAL_COMMUNITY)
Admission: EM | Admit: 2012-07-27 | Discharge: 2012-07-28 | Disposition: A | Discharge status: Home / Self Care | Payer: BC Managed Care – PPO | Attending: Emergency Medicine | Admitting: Emergency Medicine

## 2012-07-27 DIAGNOSIS — Z87891 Personal history of nicotine dependence: Secondary | ICD-10-CM | POA: Insufficient documentation

## 2012-07-27 DIAGNOSIS — H6120 Impacted cerumen, unspecified ear: Secondary | ICD-10-CM | POA: Insufficient documentation

## 2012-07-27 DIAGNOSIS — I10 Essential (primary) hypertension: Secondary | ICD-10-CM | POA: Insufficient documentation

## 2012-07-27 DIAGNOSIS — E669 Obesity, unspecified: Secondary | ICD-10-CM | POA: Insufficient documentation

## 2012-07-27 NOTE — ED Notes (Signed)
Pt c/o pain in her left ear x 1.5 weeks

## 2012-07-28 MED ORDER — NEOMYCIN-POLYMYXIN-HC 3.5-10000-1 OT SOLN
3.0000 [drp] | Freq: Once | OTIC | Status: AC
  Administered 2012-07-28: 3 [drp] via OTIC
  Filled 2012-07-28: qty 10

## 2012-07-28 MED ORDER — ANTIPYRINE-BENZOCAINE 5.4-1.4 % OT SOLN
OTIC | Status: AC
  Administered 2012-07-28: 2 [drp]
  Filled 2012-07-28: qty 10

## 2012-07-29 NOTE — ED Provider Notes (Signed)
History     CSN: [id number]  Arrival date & time 07/27/12  2149   First MD Initiated Contact with Patient 07/27/12 2345      Chief Complaint  Patient presents with  . Otalgia    (Consider location/radiation/quality/duration/timing/severity/associated sxs/prior treatment) HPI Comments: Marcia Hahn presents with left ear ache for the past 10 days.  She has been seen by a local urgent care center twice and diagnosed with left cerumen impaction.  She has had her left ear irrigated at both visits with reported no successful removal of the cerumen.  She has constant pressure in her ear with intermittent sharp stabs of pain.  She denies hearing loss, drainage from the ear, fevers, nasal congestion and sore throat.  She has treated her pain with topical "sweet oil" which gives minimal relief.  The history is provided by the patient.    Past Medical History  Diagnosis Date  . Acne vulgaris   . Mild obesity   . Hypertension     Past Surgical History  Procedure Date  . Cesarean section     Family History  Problem Relation Age of Onset  . GER disease Mother   . Diabetes Mother   . Hypertension Mother   . Heart attack Father 61    History  Substance Use Topics  . Smoking status: Former Games developer  . Smokeless tobacco: Not on file  . Alcohol Use: Yes    OB History    Grav Para Term Preterm Abortions TAB SAB Ect Mult Living                  Review of Systems  Constitutional: Negative for fever.  HENT: Positive for ear pain. Negative for hearing loss, congestion, sore throat, neck pain, sinus pressure, tinnitus and ear discharge.   Eyes: Negative.   Respiratory: Negative.   Cardiovascular: Negative.   Gastrointestinal: Negative.   Musculoskeletal: Negative.   Skin: Negative for rash.  Neurological: Negative for dizziness, light-headedness, numbness and headaches.  Hematological: Negative.   Psychiatric/Behavioral: Negative.     Allergies  Review of patient's  allergies indicates no known allergies.  Home Medications   Current Outpatient Rx  Name Route Sig Dispense Refill  . HYDROCHLOROTHIAZIDE 12.5 MG PO CAPS Oral Take 1 capsule (12.5 mg total) by mouth daily. 30 capsule 5    D/c triamterene effective 06/05/2011  . IBUPROFEN 800 MG PO TABS Oral Take 1 tablet (800 mg total) by mouth 3 (three) times daily. Take 1 tablet by mouth three times a day for 10 days, then twice daily as needed 30 tablet 3    BP 136/85  Pulse 89  Temp 98.4 F (36.9 C) (Oral)  Resp 18  SpO2 100%  LMP 07/12/2012  Physical Exam  Constitutional: She is oriented to person, place, and time. She appears well-developed and well-nourished.  HENT:  Head: Normocephalic and atraumatic.  Right Ear: Tympanic membrane and ear canal normal.  Left Ear: No drainage or tenderness. No mastoid tenderness.  Nose: Mucosal edema and rhinorrhea present.  Mouth/Throat: Uvula is midline, oropharynx is clear and moist and mucous membranes are normal. No oropharyngeal exudate, posterior oropharyngeal edema, posterior oropharyngeal erythema or tonsillar abscesses.       Unable to visulize left TM due to cerumen which is deep,  Appears to be possibly lying against the TM, appears soft and light yellow in color.  External canal slightly edematous without erythema or purulent drainage.  Eyes: Conjunctivae normal are normal.  Pulmonary/Chest:  Effort normal. No respiratory distress. She has no wheezes. She has no rales.  Abdominal: Soft. There is no tenderness.  Musculoskeletal: Normal range of motion.  Neurological: She is alert and oriented to person, place, and time.  Skin: Skin is warm and dry. No rash noted.  Psychiatric: She has a normal mood and affect.    ED Course  EAR CERUMEN REMOVAL Performed by: Burgess Amor Authorized by: Burgess Amor Consent: Verbal consent obtained. Risks and benefits: risks, benefits and alternatives were discussed Consent given by: patient Patient identity  confirmed: verbally with patient Time out: Immediately prior to procedure a "time out" was called to verify the correct patient, procedure, equipment, support staff and site/side marked as required. Local anesthetic: proparacaine drops Anesthetic total: 4 drops Ceruminolytics applied: Ceruminolytics applied prior to the procedure. Location details: left ear Procedure type: irrigation Patient tolerance: Patient tolerated the procedure well with no immediate complications. Comments: Unable to mobilize the cerumen after h2o2 soak, then 4 flushes with warm tap water.  Pt did obtain pain relief with auralgan.   (including critical care time)  Labs Reviewed - No data to display No results found.   1. Cerumen impaction       MDM  Pt with otalgia and cerumen impaction.  Outer canal probably edematous secondary to irritation from flushes,  But will cover with cortisporin otic solution.  Also given auralgan for home use. Pt was referred to Dr. Suszanne Conners for resolution of this impaction.  Pt agrees to call in am for appt.        Burgess Amor, PA 07/29/12 1124

## 2012-07-31 NOTE — ED Provider Notes (Signed)
Medical screening examination/treatment/procedure(s) were performed by non-physician practitioner and as supervising physician I was immediately available for consultation/collaboration.  Sunnie Nielsen, MD 07/31/12 [phone]

## 2012-08-07 ENCOUNTER — Ambulatory Visit (INDEPENDENT_AMBULATORY_CARE_PROVIDER_SITE_OTHER): Payer: BC Managed Care – PPO | Admitting: Otolaryngology

## 2012-08-07 DIAGNOSIS — H66019 Acute suppurative otitis media with spontaneous rupture of ear drum, unspecified ear: Secondary | ICD-10-CM

## 2012-08-07 DIAGNOSIS — H903 Sensorineural hearing loss, bilateral: Secondary | ICD-10-CM

## 2012-09-03 ENCOUNTER — Other Ambulatory Visit: Payer: Self-pay

## 2012-09-03 DIAGNOSIS — I10 Essential (primary) hypertension: Secondary | ICD-10-CM

## 2012-09-03 MED ORDER — HYDROCHLOROTHIAZIDE 12.5 MG PO CAPS: 12.5000 mg | ORAL_CAPSULE | Freq: Every day | ORAL | Status: DC

## 2012-10-14 ENCOUNTER — Ambulatory Visit: Payer: BC Managed Care – PPO | Admitting: Family Medicine

## 2012-11-10 ENCOUNTER — Other Ambulatory Visit: Payer: Self-pay | Admitting: Family Medicine

## 2012-11-11 LAB — CBC
HCT: 35 % — ABNORMAL LOW (ref 36.0–46.0)
Hemoglobin: 11.4 g/dL — ABNORMAL LOW (ref 12.0–15.0)
MCH: 28.4 pg (ref 26.0–34.0)
MCHC: 32.6 g/dL (ref 30.0–36.0)

## 2012-11-11 LAB — VITAMIN D 25 HYDROXY (VIT D DEFICIENCY, FRACTURES): Vit D, 25-Hydroxy: 17 ng/mL — ABNORMAL LOW (ref 30–89)

## 2012-11-11 LAB — IRON: Iron: 114 ug/dL (ref 42–145)

## 2012-11-17 ENCOUNTER — Encounter: Payer: Self-pay | Admitting: Family Medicine

## 2012-11-17 ENCOUNTER — Ambulatory Visit (INDEPENDENT_AMBULATORY_CARE_PROVIDER_SITE_OTHER): Payer: BC Managed Care – PPO | Admitting: Family Medicine

## 2012-11-17 VITALS — BP 120/90 | HR 88 | Resp 18 | Ht 66.0 in | Wt 197.0 lb

## 2012-11-17 DIAGNOSIS — I10 Essential (primary) hypertension: Secondary | ICD-10-CM

## 2012-11-17 DIAGNOSIS — R7303 Prediabetes: Secondary | ICD-10-CM

## 2012-11-17 DIAGNOSIS — H4710 Unspecified papilledema: Secondary | ICD-10-CM

## 2012-11-17 DIAGNOSIS — R5381 Other malaise: Secondary | ICD-10-CM

## 2012-11-17 DIAGNOSIS — R7309 Other abnormal glucose: Secondary | ICD-10-CM

## 2012-11-17 DIAGNOSIS — Z13220 Encounter for screening for lipoid disorders: Secondary | ICD-10-CM

## 2012-11-17 DIAGNOSIS — E663 Overweight: Secondary | ICD-10-CM

## 2012-11-17 DIAGNOSIS — E559 Vitamin D deficiency, unspecified: Secondary | ICD-10-CM | POA: Insufficient documentation

## 2012-11-17 MED ORDER — ERGOCALCIFEROL 1.25 MG (50000 UT) PO CAPS: 50000.0000 [IU] | ORAL_CAPSULE | ORAL | Status: DC

## 2012-11-17 NOTE — Assessment & Plan Note (Addendum)
Uncontrolled, has been off HCTZ but on diamox for pailededma, will continue this and pt motivated to focus on lifestyle change

## 2012-11-17 NOTE — Assessment & Plan Note (Signed)
Deteriorated. Patient re-educated about  the importance of commitment to a  minimum of 150 minutes of exercise per week. The importance of healthy food choices with portion control discussed. Encouraged to start a food diary, count calories and to consider  joining a support group. Sample diet sheets offered. Goals set by the patient for the next several months.    

## 2012-11-17 NOTE — Progress Notes (Signed)
  Subjective:    Patient ID: Marcia Hahn, female    DOB: 04/05/1976, 37 y.o.   MRN: [id number]  HPI The PT is here for follow up and re-evaluation of chronic medical conditions, medication management and review of any available recent lab and radiology data.  Preventive health is updated, specifically  Cancer screening and Immunization.   Questions or concerns regarding consultations or procedures which the PT has had in the interim are  Addressed.Being followed and treated for papilledema by neurology , headaches and vision have improved The PT denies any adverse reactions to current medications since the last visit.  Concerned about weight gain, intends to be more aggressive about lifestyle modification once more  There are no specific complaints       Review of Systems See HPI Denies recent fever or chills. Denies sinus pressure, nasal congestion, ear pain or sore throat. Denies chest congestion, productive cough or wheezing. Denies chest pains, palpitations and leg swelling Denies abdominal pain, nausea, vomiting,diarrhea or constipation.   Denies dysuria, frequency, hesitancy or incontinence. Denies joint pain, swelling and limitation in mobility. Denies headaches, seizures, numbness, or tingling. Denies depression, anxiety or insomnia. Denies skin break down or rash.        Objective:   Physical Exam  Patient alert and oriented and in no cardiopulmonary distress.  HEENT: No facial asymmetry, EOMI, no sinus tenderness,  oropharynx pink and moist.  Neck supple no adenopathy.  Chest: Clear to auscultation bilaterally.  CVS: S1, S2 no murmurs, no S3.  ABD: Soft non tender. Bowel sounds normal.  Ext: No edema  MS: Adequate ROM spine, shoulders, hips and knees.  Skin: Intact, no ulcerations or rash noted.  Psych: Good eye contact, normal affect. Memory intact not anxious or depressed appearing.  CNS: CN 2-12 intact, power, tone and sensation normal  throughout.       Assessment & Plan:

## 2012-11-17 NOTE — Patient Instructions (Addendum)
CPE  July 23 or after, please call if you need me before.  Blood sugar has improved, however weight has increased, so please work consistently on weight loss, through commitment to exercise minimum 3 days  Per week, 45 mins per session.  Healthy food choices, restricting calories to 1200 to 1500 cals per day, Goal weight loss of 3 to 4 pounds per month. Goal weight loss is 20 pounds  Start vit D once weekly, this is prescribed for a total of 6 month  Iron is improved, but still low, so increase iron tab to twice daily if tolerated   Fasting lipid, chem 7, TSh, HBA1C and vit D, cbc and iron and ferritin in end [place]

## 2012-12-08 ENCOUNTER — Ambulatory Visit (INDEPENDENT_AMBULATORY_CARE_PROVIDER_SITE_OTHER): Payer: BC Managed Care – PPO | Admitting: Family Medicine

## 2012-12-08 ENCOUNTER — Encounter: Payer: Self-pay | Admitting: Family Medicine

## 2012-12-08 VITALS — BP 122/84 | HR 77 | Temp 98.3°F | Resp 16 | Ht 66.0 in | Wt 191.1 lb

## 2012-12-08 DIAGNOSIS — I10 Essential (primary) hypertension: Secondary | ICD-10-CM

## 2012-12-08 DIAGNOSIS — E663 Overweight: Secondary | ICD-10-CM

## 2012-12-08 DIAGNOSIS — M25579 Pain in unspecified ankle and joints of unspecified foot: Secondary | ICD-10-CM

## 2012-12-08 DIAGNOSIS — M25572 Pain in left ankle and joints of left foot: Secondary | ICD-10-CM | POA: Insufficient documentation

## 2012-12-08 DIAGNOSIS — J019 Acute sinusitis, unspecified: Secondary | ICD-10-CM

## 2012-12-08 MED ORDER — AZITHROMYCIN 250 MG PO TABS: ORAL_TABLET | ORAL | Status: AC

## 2012-12-08 NOTE — Patient Instructions (Addendum)
F/u as before.   You are referred to orthopecs  ankle injury.Please expect a call on you appt tomorrow. Hold on the xray unles told otherwise  You are being treated with a z pack for sinus infection  Congrats on weight loss, keep it up

## 2012-12-08 NOTE — Progress Notes (Signed)
  Subjective:    Patient ID: Marcia Hahn, female    DOB: 17-Dec-1975, 37 y.o.   MRN: [id number]  HPI 2 weeks ago pt twisted her left ankle since then she has noted pain and swelling which is persisting also tenderness on lateral aspect. 2 week h/o periorbital pressure, yellow bloody right nasal  drainage, at times, no other resp symptoms. Has successfully modified diet with excellent weight loss, plans to continue same   Review of Systems See HPI Denies recent fever or chills. Denies  ear pain or sore throat. Denies chest congestion, productive cough or wheezing. Denies chest pains, palpitations and leg swelling Denies abdominal pain, nausea, vomiting,diarrhea or constipation.   Denies dysuria, frequency, hesitancy or incontinence.  Denies headaches, seizures, numbness, or tingling. Denies depression, anxiety or insomnia. Denies skin break down or rash.        Objective:   Physical Exam  Patient alert and oriented and in no cardiopulmonary distress.  HEENT: No facial asymmetry, EOMI,frontal sinus tenderness,oropharynx pink.Neck left anterioir cervical adenopathy  Chest: Clear to auscultation bilaterally.  CVS: S1, S2 no murmurs, no S3.  ABD: Soft non tender. Bowel sounds normal.  Ext: No edema  MS: Adequate ROM spine, shoulders, hips and knees.Left ankle swollen, tender on lateral aspect, swollen with mild limitation of movement  Skin: Intact, no ulcerations or rash noted.  Psych: Good eye contact, normal affect. Memory intact not anxious or depressed appearing.  CNS: CN 2-12 intact, power, tone and sensation normal throughout.       Assessment & Plan:

## 2012-12-08 NOTE — Assessment & Plan Note (Signed)
Improved. Pt applauded on succesful weight loss through lifestyle change, and encouraged to continue same. Weight loss goal set for the next several months.  

## 2012-12-08 NOTE — Assessment & Plan Note (Signed)
Controlled, no change in medication DASH diet and commitment to daily physical activity for a minimum of 30 minutes discussed and encouraged, as a part of hypertension management. The importance of attaining a healthy weight is also discussed.  

## 2012-12-08 NOTE — Assessment & Plan Note (Signed)
Acute infection, antibiotic course prescribed 

## 2012-12-08 NOTE — Assessment & Plan Note (Signed)
Pain and swelling following injury, likely sprain, refer for xray and ortho eval

## 2012-12-10 ENCOUNTER — Telehealth: Payer: Self-pay

## 2012-12-10 DIAGNOSIS — R7303 Prediabetes: Secondary | ICD-10-CM | POA: Insufficient documentation

## 2012-12-10 DIAGNOSIS — R7309 Other abnormal glucose: Secondary | ICD-10-CM | POA: Insufficient documentation

## 2012-12-10 NOTE — Assessment & Plan Note (Signed)
Improved HBA1C though pt has gained weight, she will need top work on weight loss to become normo glycemic and is motivated to doing so

## 2012-12-10 NOTE — Telephone Encounter (Signed)
Will sign on the mRI, pls send order

## 2012-12-10 NOTE — Telephone Encounter (Signed)
Order sent to [facility].

## 2012-12-10 NOTE — Assessment & Plan Note (Signed)
Weekly supplements recommended for a minimum of 6 month

## 2012-12-10 NOTE — Assessment & Plan Note (Signed)
Treated and followed at [facility] on diamox

## 2012-12-11 ENCOUNTER — Encounter: Payer: Self-pay | Admitting: Orthopedic Surgery

## 2012-12-11 ENCOUNTER — Ambulatory Visit (INDEPENDENT_AMBULATORY_CARE_PROVIDER_SITE_OTHER): Payer: BC Managed Care – PPO

## 2012-12-11 ENCOUNTER — Ambulatory Visit (INDEPENDENT_AMBULATORY_CARE_PROVIDER_SITE_OTHER): Payer: BC Managed Care – PPO | Admitting: Orthopedic Surgery

## 2012-12-11 VITALS — BP 116/60 | Ht 66.0 in | Wt 194.8 lb

## 2012-12-11 DIAGNOSIS — M958 Other specified acquired deformities of musculoskeletal system: Secondary | ICD-10-CM

## 2012-12-11 DIAGNOSIS — S99919A Unspecified injury of unspecified ankle, initial encounter: Secondary | ICD-10-CM

## 2012-12-11 DIAGNOSIS — S99929A Unspecified injury of unspecified foot, initial encounter: Secondary | ICD-10-CM

## 2012-12-11 DIAGNOSIS — M959 Acquired deformity of musculoskeletal system, unspecified: Secondary | ICD-10-CM

## 2012-12-11 DIAGNOSIS — S93409A Sprain of unspecified ligament of unspecified ankle, initial encounter: Secondary | ICD-10-CM | POA: Insufficient documentation

## 2012-12-11 HISTORY — DX: Other specified acquired deformities of musculoskeletal system: M95.8

## 2012-12-11 NOTE — Progress Notes (Signed)
Patient ID: Marcia Hahn, female   DOB: Dec 21, 1975, 37 y.o.   MRN: [id number] Chief Complaint  Patient presents with  . Ankle Pain    left ankle pain "stepped funny" ( around new years day)    Referral by Dr. Syliva Overman  37 year old female twisted her ankle about 3 weeks ago did notice a lot of swelling but continued to have pain and then eventually had more pain on top of the foot and along the lateral ankle joint with occasional symptoms medially. She had an MRI which showed an osteochondral lesion of the medial dome of the tail is. She complains of sharp dull 4/10 intermittent pain which is worse with movement and walking better with elevation of the foot. All systems were reviewed and were negative except for some food reactions  Negative medical history  No surgeries other than the cesarean section for birth  Remaining history reviewed and recorded see chart for details  Physical Exam(12)  Vital signs:   GENERAL: normal development   CDV: pulses are normal   Skin: normal  Lymph: nodes were not palpable/normal  Psychiatric: awake, alert and oriented  Neuro: normal sensation  MSK  Gait: Her ambulation is notable for slight limp 1 Inspection her ankle and foot are swollen she has tenderness over the anterior talofibular ligament and medial dome of the talus. The metatarsal #5 is normal the Achilles is intact palpable normal and the peroneal tendon retinaculum is palpable nontender 2 Range of Motion slight decreased range of motion in the ankle joint with dorsiflexion normal plantar flexion 3 Motor mild weakness with eversion and Versed flexion 4 Stability anterior drawer is normal   Imaging I did review an MRI from [facility] and it shows a type I osteochondral lesion of the tail is, I ordered and reviewed an x-ray here and my interpretation is that she has no ankle fracture and the mortise is intact see report for details  Assessment:  Diagnoses #1 grade 1  ankle sprain Diagnosis #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

## 2012-12-11 NOTE — Patient Instructions (Addendum)
Talar osteochondral lesion Type I Ankle sprain   Wear short cam walker x 6 weeks

## 2013-01-12 ENCOUNTER — Encounter: Payer: Self-pay | Admitting: Family Medicine

## 2013-01-22 ENCOUNTER — Ambulatory Visit: Payer: BC Managed Care – PPO | Admitting: Orthopedic Surgery

## 2013-01-28 ENCOUNTER — Ambulatory Visit: Payer: BC Managed Care – PPO | Admitting: Orthopedic Surgery

## 2013-02-04 ENCOUNTER — Encounter: Payer: Self-pay | Admitting: Orthopedic Surgery

## 2013-02-04 ENCOUNTER — Ambulatory Visit (INDEPENDENT_AMBULATORY_CARE_PROVIDER_SITE_OTHER): Payer: BC Managed Care – PPO | Admitting: Orthopedic Surgery

## 2013-02-04 VITALS — BP 124/78 | Ht 66.0 in | Wt 194.0 lb

## 2013-02-04 DIAGNOSIS — S93402D Sprain of unspecified ligament of left ankle, subsequent encounter: Secondary | ICD-10-CM

## 2013-02-04 DIAGNOSIS — Z5189 Encounter for other specified aftercare: Secondary | ICD-10-CM

## 2013-02-04 NOTE — Progress Notes (Signed)
Patient ID: Marcia Hahn, female   DOB: 11-May-1976, 37 y.o.   MRN: [id number] Chief Complaint  Patient presents with  . Follow-up    follow up ankle sprain and left grade 1 OCD DOI 11/12/12    History the patient injured her ankle had delayed presentation. MRI showed osteochondral lesion, type I, along with an ankle sprain. She was treated with a Cam Walker presents back for reevaluation.  Major complaints soreness around the ankle. She is now walking in a regular shoe.  Denies catching, locking, giving way  BP 124/78  Ht 5\' 6"  (1.676 m)  Wt 194 lb (87.998 kg)  BMI 31.33 kg/m2 General appearance is normal, the patient is alert and oriented x3 with normal mood and affect.  She can ambulate with no evidence of a limp. She is still sore and has ankle effusion. Small. Range of motion is normalLigaments are stable  Recommendations physical therapy. She would like to do it at home. Follow-up in 6 weeks

## 2013-02-04 NOTE — Patient Instructions (Addendum)
Call hospital to arrange PT   Do therapy for 6 weeks 3 x a week

## 2013-02-09 ENCOUNTER — Other Ambulatory Visit: Payer: Self-pay | Admitting: Obstetrics and Gynecology

## 2013-02-18 ENCOUNTER — Emergency Department (HOSPITAL_COMMUNITY)
Admission: EM | Admit: 2013-02-18 | Discharge: 2013-02-18 | Disposition: A | Discharge status: Home / Self Care | Payer: BC Managed Care – PPO | Attending: Emergency Medicine | Admitting: Emergency Medicine

## 2013-02-18 ENCOUNTER — Encounter (HOSPITAL_COMMUNITY): Payer: Self-pay | Admitting: *Deleted

## 2013-02-18 ENCOUNTER — Emergency Department (HOSPITAL_COMMUNITY): Payer: BC Managed Care – PPO

## 2013-02-18 DIAGNOSIS — I10 Essential (primary) hypertension: Secondary | ICD-10-CM | POA: Insufficient documentation

## 2013-02-18 DIAGNOSIS — E669 Obesity, unspecified: Secondary | ICD-10-CM | POA: Insufficient documentation

## 2013-02-18 DIAGNOSIS — Z79899 Other long term (current) drug therapy: Secondary | ICD-10-CM | POA: Insufficient documentation

## 2013-02-18 DIAGNOSIS — Z87891 Personal history of nicotine dependence: Secondary | ICD-10-CM | POA: Insufficient documentation

## 2013-02-18 DIAGNOSIS — R059 Cough, unspecified: Secondary | ICD-10-CM | POA: Insufficient documentation

## 2013-02-18 DIAGNOSIS — R05 Cough: Secondary | ICD-10-CM | POA: Insufficient documentation

## 2013-02-18 DIAGNOSIS — R0789 Other chest pain: Secondary | ICD-10-CM | POA: Insufficient documentation

## 2013-02-18 DIAGNOSIS — R062 Wheezing: Secondary | ICD-10-CM | POA: Insufficient documentation

## 2013-02-18 DIAGNOSIS — J9801 Acute bronchospasm: Secondary | ICD-10-CM

## 2013-02-18 DIAGNOSIS — Z872 Personal history of diseases of the skin and subcutaneous tissue: Secondary | ICD-10-CM | POA: Insufficient documentation

## 2013-02-18 DIAGNOSIS — J40 Bronchitis, not specified as acute or chronic: Secondary | ICD-10-CM

## 2013-02-18 DIAGNOSIS — J209 Acute bronchitis, unspecified: Secondary | ICD-10-CM | POA: Insufficient documentation

## 2013-02-18 MED ORDER — ALBUTEROL SULFATE HFA 108 (90 BASE) MCG/ACT IN AERS
2.0000 | INHALATION_SPRAY | Freq: Once | RESPIRATORY_TRACT | Status: AC
  Administered 2013-02-18: 2 via RESPIRATORY_TRACT
  Filled 2013-02-18: qty 6.7

## 2013-02-18 MED ORDER — AMOXICILLIN 500 MG PO CAPS: 500.0000 mg | ORAL_CAPSULE | Freq: Three times a day (TID) | ORAL | Status: DC

## 2013-02-18 NOTE — ED Notes (Addendum)
Pt reporting SOB for about a week, worse today.  Reports occasional productive cough, yellow mucous.  Pt SOB with exertion.

## 2013-02-18 NOTE — ED Notes (Signed)
Pt reports that she is feeling better following inhaler.  No additional concerns verbalized at present time.

## 2013-02-18 NOTE — ED Provider Notes (Signed)
History    This chart was scribed for Marcia Lennert, MD by Marcia Hahn, ED Scribe. The patient was seen in room APA05/APA05. Patient's care was started at 9:53 PM.   CSN: [id number]  Arrival date & time 02/18/13  2119   First MD Initiated Contact with Patient 02/18/13 2153      Chief Complaint  Patient presents with  . Shortness of Breath    (Consider location/radiation/quality/duration/timing/severity/associated sxs/prior treatment) Patient is a 37 y.o. female presenting with shortness of breath. The history is provided by the patient. No language interpreter was used.  Shortness of Breath Severity:  Moderate Onset quality:  Sudden Timing:  Constant Progression:  Unchanged Chronicity:  New Context: not animal exposure   Relieved by:  Inhaler Worsened by:  Nothing tried Associated symptoms: wheezing   Associated symptoms: no abdominal pain   Risk factors: obesity    Marcia Hahn is a 38 y.o. female with h/o HTN who presents to the Emergency Department complaining of moderate constant SOB onset a 1.5 week ago. Pt states that her SOB has a "tight" sensation with an associated productive cough with light yellow sputum. Pt states she used her daughters inhaler pta which alleviated her sx's transiently. Pt states she took some Prednisone (2 tablets) pta she had from a previous infection. Pt denies fever, chills, cough, nausea, vomiting, diarrhea, weakness, and any other associated symptoms. Pt's PCP is Dr. Lodema Hahn.   Past Medical History  Diagnosis Date  . Acne vulgaris   . Mild obesity   . Hypertension     Past Surgical History  Procedure Laterality Date  . Cesarean section      Family History  Problem Relation Age of Onset  . GER disease Mother   . Diabetes Mother   . Hypertension Mother   . Heart attack Father 98    History  Substance Use Topics  . Smoking status: Former Games developer  . Smokeless tobacco: Not on file  . Alcohol Use: Yes     Comment: occasional     OB History   Grav Para Term Preterm Abortions TAB SAB Ect Mult Living                  Review of Systems  Respiratory: Positive for chest tightness, shortness of breath and wheezing.   Gastrointestinal: Negative for abdominal pain.    Allergies  Review of patient's allergies indicates no known allergies.  Home Medications   Current Outpatient Rx  Name  Route  Sig  Dispense  Refill  . acetaZOLAMIDE (DIAMOX) 250 MG tablet   Oral   Take 1 tablet (250 mg total) by mouth 2 (two) times daily.   60 tablet   3   . ergocalciferol (VITAMIN D2) 50000 UNITS capsule   Oral   Take 1 capsule (50,000 Units total) by mouth once a week. One capsule once weekly   12 capsule   1   . ibuprofen (ADVIL,MOTRIN) 800 MG tablet   Oral   Take 1 tablet (800 mg total) by mouth 3 (three) times daily. Take 1 tablet by mouth three times a day for 10 days, then twice daily as needed   30 tablet   3   . metroNIDAZOLE (METROGEL) 0.75 % vaginal gel      USE VAGINALLY ONCE AT END OF MENSES   70 g   0     BP 150/91  Pulse 76  Temp(Src) 97.9 F (36.6 C) (Oral)  Resp 24  Ht  5\' 6"  (1.676 m)  Wt 199 lb (90.266 kg)  BMI 32.13 kg/m2  SpO2 100%  LMP 02/11/2013  Physical Exam  Nursing note and vitals reviewed. Constitutional: She is oriented to person, place, and time. She appears well-developed.  HENT:  Head: Normocephalic.  Eyes: Conjunctivae and EOM are normal. No scleral icterus.  Neck: Neck supple. No thyromegaly present.  Cardiovascular: Normal rate and regular rhythm.  Exam reveals no gallop and no friction rub.   No murmur heard. Pulmonary/Chest: No stridor. She has wheezes. She has no rales. She exhibits no tenderness.  Mild wheezing bilaterally   Abdominal: She exhibits no distension. There is no tenderness. There is no rebound.  Musculoskeletal: Normal range of motion. She exhibits no edema.  Lymphadenopathy:    She has no cervical adenopathy.  Neurological: She is oriented to  person, place, and time. Coordination normal.  Skin: No rash noted. No erythema.  Psychiatric: She has a normal mood and affect. Her behavior is normal.    ED Course  Procedures (including critical care time) DIAGNOSTIC STUDIES: Oxygen Saturation is 100% on room air, normal by my interpretation.    COORDINATION OF CARE: 10:00 PM Discussed ED treatment with pt and pt agrees.  11:01 PM Discussed xray results with pt and pt agrees. Consulted with pt of using prescribed inhaler and antibiotics.   Labs Reviewed - No data to display Dg Chest 2 View  02/18/2013  *RADIOLOGY REPORT*  Clinical Data: Shortness of breath.  CHEST - 2 VIEW  Comparison: November 06, 2007.  Findings: Cardiomediastinal silhouette appears normal.  No acute pulmonary disease is noted.  Bony thorax is intact.  IMPRESSION: No acute cardiopulmonary abnormality seen.   Original Report Authenticated By: Marcia Hahn.,  M.D.      No diagnosis found.    MDM   The chart was scribed for me under my direct supervision.  I personally performed the history, physical, and medical decision making and all procedures in the evaluation of this patient.Marcia Lennert, MD 02/18/13 [phone]

## 2013-03-19 ENCOUNTER — Encounter: Payer: Self-pay | Admitting: Orthopedic Surgery

## 2013-03-19 ENCOUNTER — Ambulatory Visit: Payer: BC Managed Care – PPO | Admitting: Orthopedic Surgery

## 2013-04-29 DIAGNOSIS — H539 Unspecified visual disturbance: Secondary | ICD-10-CM

## 2013-04-29 HISTORY — DX: Unspecified visual disturbance: H53.9

## 2013-05-18 ENCOUNTER — Other Ambulatory Visit: Payer: Self-pay

## 2013-05-18 DIAGNOSIS — E559 Vitamin D deficiency, unspecified: Secondary | ICD-10-CM

## 2013-05-18 MED ORDER — ERGOCALCIFEROL 1.25 MG (50000 UT) PO CAPS: 50000.0000 [IU] | ORAL_CAPSULE | ORAL | Status: DC

## 2013-06-05 ENCOUNTER — Encounter: Payer: BC Managed Care – PPO | Admitting: Family Medicine

## 2013-06-06 ENCOUNTER — Encounter (HOSPITAL_COMMUNITY): Payer: Self-pay | Admitting: *Deleted

## 2013-06-06 ENCOUNTER — Emergency Department (HOSPITAL_COMMUNITY)
Admission: EM | Admit: 2013-06-06 | Discharge: 2013-06-06 | Disposition: A | Discharge status: Home / Self Care | Payer: BC Managed Care – PPO | Attending: Emergency Medicine | Admitting: Emergency Medicine

## 2013-06-06 DIAGNOSIS — Z87891 Personal history of nicotine dependence: Secondary | ICD-10-CM | POA: Insufficient documentation

## 2013-06-06 DIAGNOSIS — Z872 Personal history of diseases of the skin and subcutaneous tissue: Secondary | ICD-10-CM | POA: Insufficient documentation

## 2013-06-06 DIAGNOSIS — I10 Essential (primary) hypertension: Secondary | ICD-10-CM | POA: Insufficient documentation

## 2013-06-06 DIAGNOSIS — G971 Other reaction to spinal and lumbar puncture: Secondary | ICD-10-CM | POA: Insufficient documentation

## 2013-06-06 DIAGNOSIS — E669 Obesity, unspecified: Secondary | ICD-10-CM | POA: Insufficient documentation

## 2013-06-06 MED ORDER — DIPHENHYDRAMINE HCL 50 MG/ML IJ SOLN
25.0000 mg | Freq: Once | INTRAMUSCULAR | Status: AC
  Administered 2013-06-06: 25 mg via INTRAVENOUS
  Filled 2013-06-06: qty 1

## 2013-06-06 MED ORDER — SODIUM CHLORIDE 0.9 % IV SOLN: 1000.0000 mL | INTRAVENOUS | Status: DC

## 2013-06-06 MED ORDER — ONDANSETRON 8 MG PO TBDP: 8.0000 mg | ORAL_TABLET | Freq: Three times a day (TID) | ORAL | Status: DC | PRN

## 2013-06-06 MED ORDER — SODIUM CHLORIDE 0.9 % IV BOLUS (SEPSIS)
1000.0000 mL | Freq: Once | INTRAVENOUS | Status: AC
  Administered 2013-06-06: 1000 mL via INTRAVENOUS

## 2013-06-06 MED ORDER — SODIUM CHLORIDE 0.9 % IV SOLN
1000.0000 mL | Freq: Once | INTRAVENOUS | Status: AC
  Administered 2013-06-06: 1000 mL via INTRAVENOUS

## 2013-06-06 MED ORDER — METOCLOPRAMIDE HCL 5 MG/ML IJ SOLN
10.0000 mg | Freq: Once | INTRAMUSCULAR | Status: AC
  Administered 2013-06-06: 10 mg via INTRAVENOUS
  Filled 2013-06-06: qty 2

## 2013-06-06 NOTE — ED Notes (Signed)
Pt had LP performed Thursday to test pressure on optic nerve at [facility], presents to er today with c/o headache with nausea since Thursday.

## 2013-06-06 NOTE — ED Provider Notes (Signed)
CSN: [id number]     Arrival date & time 06/06/13  1353 History  This chart was scribed for Ward Givens, MD, by Yevette Edwards, ED Scribe. This patient was seen in room APA15/APA15 and the patient's care was started at 3:53 PM.   First MD Initiated Contact with Patient 06/06/13 1500     Chief Complaint  Patient presents with  . Headache    The history is provided by the patient. No language interpreter was used.   HPI Comments: Marcia Hahn is a 37 y.o. Female, with a h/o of papilledema, who presents to the Emergency Department complaining of a headache which began two days ago. She reports that the headache developed thirty minutes after she had a lumbar puncture that was done to measure her pressure because she was recently found to have pappilledemea; she denies any pain around puncture site. She denies HA prior, she was having visual problems and was seen by an ophthalmologist who sent her to neurology at [facility]. The pt describes the headache as "dull, throbbing, and pressure." She states that light and movement exacerbate the headache. The pt states that being recumbent lessens the headache. The pt denies  emesis, or a fever. She does have nausea. She has never had a headache like this before. She had the procedure done by Neurology at [facility], her next appt with them is in December.   Dr Sinda Du is pt's opthamologist.  PCP Dr Lodema Hong  Past Medical History  Diagnosis Date  . Acne vulgaris   . Mild obesity   . Hypertension   The pt reports she had papilledema. She denies a h/o of headaches associated with the papilledema.   Past Surgical History  Procedure Laterality Date  . Cesarean section    The pt had a lumbar puncture to detect the pressure of her optic nerve. The lumbar puncture was performed by a neurology department.   Family History  Problem Relation Age of Onset  . GER disease Mother   . Diabetes Mother   . Hypertension Mother   . Heart  attack Father 66   History  Substance Use Topics  . Smoking status: Former Games developer  . Smokeless tobacco: Not on file  . Alcohol Use: Yes     Comment: occasional  The pt is employed as a Nurse, adult for [employer].  No OB history provided.  Review of Systems  Constitutional: Negative for chills.  All other systems reviewed and are negative.    Allergies  Review of patient's allergies indicates no known allergies.  Home Medications   Current Outpatient Rx  Name  Route  Sig  Dispense  Refill  . acetaZOLAMIDE (DIAMOX) 250 MG tablet   Oral   Take 250 mg by mouth 3 (three) times daily.    60 tablet   3   . ergocalciferol (VITAMIN D2) 50000 UNITS capsule   Oral   Take 1 capsule (50,000 Units total) by mouth once a week. One capsule once weekly   12 capsule   1   . metroNIDAZOLE (METROGEL) 0.75 % vaginal gel      USE VAGINALLY ONCE AT END OF MENSES   70 g   0   . pseudoephedrine (SINUS & ALLERGY 12 HOUR) 120 MG 12 hr tablet   Oral   Take 120 mg by mouth once.          Triage Vitals: BP 129/88  Pulse 70  Temp(Src) 98.5 F (36.9 C) (Oral)  Resp 20  SpO2 100%  LMP 05/31/2013  Vital signs normal    Physical Exam  Nursing note and vitals reviewed. Constitutional: She is oriented to person, place, and time. She appears well-developed and well-nourished.  Non-toxic appearance. She does not appear ill. She appears distressed.  Appears uncomfortable, and to have a sensitivity to light.   HENT:  Head: Normocephalic and atraumatic.  Right Ear: External ear normal.  Left Ear: External ear normal.  Nose: Nose normal. No mucosal edema or rhinorrhea.  Mouth/Throat: Oropharynx is clear and moist and mucous membranes are normal. No dental abscesses or edematous.  Eyes: Conjunctivae and EOM are normal. Pupils are equal, round, and reactive to light.  Neck: Normal range of motion and full passive range of motion without pain. Neck supple.  No nuchal rigidity   Cardiovascular: Normal rate, regular rhythm and normal heart sounds.  Exam reveals no gallop and no friction rub.   No murmur heard. Pulmonary/Chest: Effort normal and breath sounds normal. No respiratory distress. She has no wheezes. She has no rhonchi. She has no rales. She exhibits no tenderness and no crepitus.  Abdominal: Soft. Normal appearance and bowel sounds are normal. She exhibits no distension. There is no tenderness. There is no rebound and no guarding.  Musculoskeletal: Normal range of motion. She exhibits no edema and no tenderness.  Moves all extremities well.  LP site is hard to find. It is very small puncture type scab area. There is no swelling, no fluctuance, and no redness.   Neurological: She is alert and oriented to person, place, and time. She has normal strength. No cranial nerve deficit.  Skin: Skin is warm, dry and intact. No rash noted. No erythema. No pallor.  Psychiatric: She has a normal mood and affect. Her speech is normal and behavior is normal. Her mood appears not anxious.    ED Course   Medications  0.9 %  sodium chloride infusion (0 mLs Intravenous Stopped 06/06/13 1755)  metoCLOPramide (REGLAN) injection 10 mg (10 mg Intravenous Given 06/06/13 1624)  diphenhydrAMINE (BENADRYL) injection 25 mg (25 mg Intravenous Given 06/06/13 1624)  sodium chloride 0.9 % bolus 1,000 mL (0 mLs Intravenous Stopped 06/06/13 2000)     Procedures (including critical care time)  DIAGNOSTIC STUDIES: Oxygen Saturation is 100% on room air, normal by my interpretation.    COORDINATION OF CARE:  4:00 PM-Discussed treatment plan with patient, and the patient agreed to the plan.   5:51 PM- Rechecked pt, and the pt reported feeling "much better." She was able to sit up in bed, unlike with the first visit in which she was in the fetal position.  Informed pt that she needed to stay hydrated and drink plenty of fluids.    1. Post lumbar puncture headache     Discharge  Medication List as of 06/06/2013  6:22 PM    START taking these medications   Details  ondansetron (ZOFRAN ODT) 8 MG disintegrating tablet Take 1 tablet (8 mg total) by mouth every 8 (eight) hours as needed for nausea., Starting 06/06/2013, Until Discontinued, Print        Plan discharge   Devoria Albe, MD, FACEP    MDM    I personally performed the services described in this documentation, which was scribed in my presence. The recorded information has been reviewed and considered.   Devoria Albe, MD, Armando Gang    Ward Givens, MD 06/06/13 [phone]

## 2013-06-12 ENCOUNTER — Ambulatory Visit (INDEPENDENT_AMBULATORY_CARE_PROVIDER_SITE_OTHER): Payer: BC Managed Care – PPO | Admitting: Family Medicine

## 2013-06-12 ENCOUNTER — Encounter: Payer: Self-pay | Admitting: Family Medicine

## 2013-06-12 VITALS — BP 132/90 | HR 74 | Resp 18 | Ht 66.0 in | Wt 205.1 lb

## 2013-06-12 DIAGNOSIS — H4710 Unspecified papilledema: Secondary | ICD-10-CM

## 2013-06-12 DIAGNOSIS — L708 Other acne: Secondary | ICD-10-CM

## 2013-06-12 DIAGNOSIS — R7309 Other abnormal glucose: Secondary | ICD-10-CM

## 2013-06-12 DIAGNOSIS — I10 Essential (primary) hypertension: Secondary | ICD-10-CM

## 2013-06-12 DIAGNOSIS — E559 Vitamin D deficiency, unspecified: Secondary | ICD-10-CM

## 2013-06-12 DIAGNOSIS — S93402S Sprain of unspecified ligament of left ankle, sequela: Secondary | ICD-10-CM

## 2013-06-12 DIAGNOSIS — R7303 Prediabetes: Secondary | ICD-10-CM

## 2013-06-12 DIAGNOSIS — E663 Overweight: Secondary | ICD-10-CM

## 2013-06-12 DIAGNOSIS — R51 Headache: Secondary | ICD-10-CM

## 2013-06-12 DIAGNOSIS — J019 Acute sinusitis, unspecified: Secondary | ICD-10-CM

## 2013-06-12 DIAGNOSIS — L700 Acne vulgaris: Secondary | ICD-10-CM | POA: Insufficient documentation

## 2013-06-12 DIAGNOSIS — Z13220 Encounter for screening for lipoid disorders: Secondary | ICD-10-CM

## 2013-06-12 MED ORDER — AZITHROMYCIN 250 MG PO TABS: ORAL_TABLET | ORAL | Status: AC

## 2013-06-12 MED ORDER — IBUPROFEN 800 MG PO TABS: 800.0000 mg | ORAL_TABLET | Freq: Two times a day (BID) | ORAL | Status: DC

## 2013-06-12 NOTE — Progress Notes (Signed)
  Subjective:    Patient ID: Marcia Hahn, female    DOB: [birth year], 37 y.o.   MRN: [id number]  HPI  Pt in for Ed follow up for severe post LP headache, which she last took excedrin migraine for up to yesterday morning. She had an LP at [facility] last week Thursday, went to Ed 3 days later. Spoke with physician who did LP this past Monday and was advised to use caffeine for the headache, this has finally resloved.  Treated for papiledema by opthalomiolgy at [facility] since Summer 2013 3 day h/o maxillary and ethmoid pressure with green post nasal drainage, has  had chills  No documented fever No consistent exercise, ongoing weight gain, no blood pressure not at goal, states she will work to reverse this in the next 6 month, counseled for approx 7 minutes, goals for exercise and dietary change also set by patient  Review of Systems See HPI Denies  nasal congestion, ear pain or sore throat. Denies chest congestion, productive cough or wheezing. Denies chest pains, palpitations and leg swelling Denies abdominal pain, nausea, vomiting,diarrhea or constipation.   Denies dysuria, frequency, hesitancy or incontinence. Denies joint pain, swelling and limitation in mobility. Denies seizures, numbness, or tingling. Denies depression, anxiety or insomnia. Denies skin break down or rash.        Objective:   Physical Exam  Patient alert and oriented and in no cardiopulmonary distress.  HEENT: No facial asymmetry, EOMI, maxillary sinus tenderness,  oropharynx pink and moist.  Neck supple no adenopathy.  Chest: Clear to auscultation bilaterally.  CVS: S1, S2 no murmurs, no S3.  ABD: Soft non tender. Bowel sounds normal.  Ext: No edema  MS: Adequate ROM spine, shoulders, hips and knees.  Skin: Intact, no ulcerations or rash noted.  Psych: Good eye contact, normal affect. Memory intact not anxious or depressed appearing.  CNS: CN 2-12 intact, power, tone and sensation normal  throughout.       Assessment & Plan:

## 2013-06-12 NOTE — Patient Instructions (Addendum)
CPE in 4 month, call  if you need me before  Weight loss goal is 2.5 pounds per month, thus 10 pounds in the next 4 month   Z pack sent in for sinuses.  CBC, iron and ferritin, lipid, chem7, TSH, vitamin D, HBA1C  Tomorrow, result note will be sent to you  hBA1C non fasting before  December visit  It is important that you exercise regularly at least 30 minutes 5 times a week. If you develop chest pain, have severe difficulty breathing, or feel very tired, stop exercising immediately and seek medical attention   Scheduled eating , healthy choices and portion size control.Water  64 ounces per day  Sugar is "poison", similar to nicotine  For smokers and alcohol for drinkers Blood pressure elevated today, so weight loss is very important

## 2013-06-13 NOTE — Assessment & Plan Note (Signed)
updated lab needed Patient educated about the importance of limiting  Carbohydrate intake , the need to commit to daily physical activity for a minimum of 30 minutes , and to commit weight loss. The fact that changes in all these areas will reduce or eliminate all together the development of diabetes is stressed.    

## 2013-06-13 NOTE — Assessment & Plan Note (Signed)
antibiotc prescribed

## 2013-06-13 NOTE — Assessment & Plan Note (Signed)
Uncontrolled, no med change DASH diet and commitment to daily physical activity for a minimum of 30 minutes discussed and encouraged, as a part of hypertension management. The importance of attaining a healthy weight is also discussed.  

## 2013-06-13 NOTE — Assessment & Plan Note (Signed)
Deteriorated. Patient re-educated about  the importance of commitment to a  minimum of 150 minutes of exercise per week. The importance of healthy food choices with portion control discussed. Encouraged to start a food diary, count calories and to consider  joining a support group. Sample diet sheets offered. Goals set by the patient for the next several months.    

## 2013-06-13 NOTE — Assessment & Plan Note (Signed)
sevre post LP headache, resolved after approx 1 week, pt had to go to ED

## 2013-06-20 LAB — BASIC METABOLIC PANEL
BUN: 12 mg/dL (ref 6–23)
Chloride: 108 mEq/L (ref 96–112)
Potassium: 4.2 mEq/L (ref 3.5–5.3)
Sodium: 139 mEq/L (ref 135–145)

## 2013-06-20 LAB — LIPID PANEL
Cholesterol: 140 mg/dL (ref 0–200)
HDL: 36 mg/dL — ABNORMAL LOW (ref >39)
LDL Cholesterol: 77 mg/dL (ref 0–99)
Total CHOL/HDL Ratio: 3.9 Ratio
Triglycerides: 137 mg/dL (ref <150)
VLDL: 27 mg/dL (ref 0–40)

## 2013-06-20 LAB — CBC
HCT: 33.6 % — ABNORMAL LOW (ref 36.0–46.0)
MCH: 30 pg (ref 26.0–34.0)
MCV: 86.8 fL (ref 78.0–100.0)
RDW: 14.2 % (ref 11.5–15.5)
WBC: 5.9 10*3/uL (ref 4.0–10.5)

## 2013-06-20 LAB — HEMOGLOBIN A1C
Hgb A1c MFr Bld: 5.7 % — ABNORMAL HIGH (ref <5.7)
Mean Plasma Glucose: 117 mg/dL — ABNORMAL HIGH (ref <117)

## 2013-06-20 LAB — TSH: TSH: 0.728 u[IU]/mL (ref 0.350–4.500)

## 2013-06-22 ENCOUNTER — Encounter: Payer: BC Managed Care – PPO | Admitting: Family Medicine

## 2013-09-17 ENCOUNTER — Other Ambulatory Visit: Payer: Self-pay

## 2013-10-12 ENCOUNTER — Other Ambulatory Visit: Payer: Self-pay | Admitting: Family Medicine

## 2013-10-14 ENCOUNTER — Ambulatory Visit (INDEPENDENT_AMBULATORY_CARE_PROVIDER_SITE_OTHER): Payer: BC Managed Care – PPO | Admitting: Family Medicine

## 2013-10-14 ENCOUNTER — Other Ambulatory Visit (HOSPITAL_COMMUNITY)
Admission: RE | Admit: 2013-10-14 | Discharge: 2013-10-14 | Disposition: A | Discharge status: Home / Self Care | Payer: BC Managed Care – PPO | Source: Ambulatory Visit | Attending: Family Medicine | Admitting: Family Medicine

## 2013-10-14 ENCOUNTER — Encounter: Payer: Self-pay | Admitting: Family Medicine

## 2013-10-14 VITALS — BP 138/82 | HR 98 | Resp 16 | Wt 207.1 lb

## 2013-10-14 DIAGNOSIS — E559 Vitamin D deficiency, unspecified: Secondary | ICD-10-CM

## 2013-10-14 DIAGNOSIS — E663 Overweight: Secondary | ICD-10-CM

## 2013-10-14 DIAGNOSIS — Z1151 Encounter for screening for human papillomavirus (HPV): Secondary | ICD-10-CM | POA: Insufficient documentation

## 2013-10-14 DIAGNOSIS — I10 Essential (primary) hypertension: Secondary | ICD-10-CM

## 2013-10-14 DIAGNOSIS — K219 Gastro-esophageal reflux disease without esophagitis: Secondary | ICD-10-CM

## 2013-10-14 DIAGNOSIS — Z0000 Encounter for general adult medical examination without abnormal findings: Secondary | ICD-10-CM

## 2013-10-14 DIAGNOSIS — K3189 Other diseases of stomach and duodenum: Secondary | ICD-10-CM

## 2013-10-14 DIAGNOSIS — R1013 Epigastric pain: Secondary | ICD-10-CM

## 2013-10-14 DIAGNOSIS — M542 Cervicalgia: Secondary | ICD-10-CM

## 2013-10-14 DIAGNOSIS — Z124 Encounter for screening for malignant neoplasm of cervix: Secondary | ICD-10-CM

## 2013-10-14 DIAGNOSIS — R7309 Other abnormal glucose: Secondary | ICD-10-CM

## 2013-10-14 DIAGNOSIS — Z01419 Encounter for gynecological examination (general) (routine) without abnormal findings: Secondary | ICD-10-CM | POA: Insufficient documentation

## 2013-10-14 DIAGNOSIS — H4710 Unspecified papilledema: Secondary | ICD-10-CM

## 2013-10-14 DIAGNOSIS — R7303 Prediabetes: Secondary | ICD-10-CM

## 2013-10-14 MED ORDER — METHOCARBAMOL 500 MG PO TABS: ORAL_TABLET | ORAL | Status: AC

## 2013-10-14 MED ORDER — IBUPROFEN 800 MG PO TABS: 800.0000 mg | ORAL_TABLET | Freq: Two times a day (BID) | ORAL | Status: DC

## 2013-10-14 MED ORDER — ESOMEPRAZOLE MAGNESIUM 20 MG PO CPDR: 20.0000 mg | DELAYED_RELEASE_CAPSULE | Freq: Every day | ORAL | Status: DC

## 2013-10-14 MED ORDER — DICLOFENAC SODIUM 1 % TD GEL: TRANSDERMAL | Status: AC

## 2013-10-14 NOTE — Patient Instructions (Addendum)
F/u in 4 month, call if you need me befDiet for Gastroesophageal Reflux Disease, Adult Reflux (acid reflux) is when acid from your stomach flows up into the esophagus. When acid comes in contact with the esophagus, the acid causes irritation and soreness (inflammation) in the esophagus. When reflux happens often or so severely that it causes damage to the esophagus, it is called gastroesophageal reflux disease (GERD). Nutrition therapy can help ease the discomfort of GERD. FOODS OR DRINKS TO AVOID OR LIMIT  Smoking or chewing tobacco. Nicotine is one of the most potent stimulants to acid production in the gastrointestinal tract.  Caffeinated and decaffeinated coffee and black tea.  Regular or low-calorie carbonated beverages or energy drinks (caffeine-free carbonated beverages are allowed).   Strong spices, such as black pepper, white pepper, red pepper, cayenne, curry powder, and chili powder.  Peppermint or spearmint.  Chocolate.  High-fat foods, including meats and fried foods. Extra added fats including oils, butter, salad dressings, and nuts. Limit these to less than 8 tsp per day.  Fruits and vegetables if they are not tolerated, such as citrus fruits or tomatoes.  Alcohol.  Any food that seems to aggravate your condition. If you have questions regarding your diet, call your caregiver or a registered dietitian. OTHER THINGS THAT MAY HELP GERD INCLUDE:   Eating your meals slowly, in a relaxed setting.  Eating 5 to 6 small meals per day instead of 3 large meals.  Eliminating food for a period of time if it causes distress.  Not lying down until 3 hours after eating a meal.  Keeping the head of your bed raised 6 to 9 inches (15 to 23 cm) by using a foam wedge or blocks under the legs of the bed. Lying flat may make symptoms worse.  Being physically active. Weight loss may be helpful in reducing reflux in overweight or obese adults.  Wear loose fitting clothing EXAMPLE MEAL  PLAN This meal plan is approximately 2,000 calories based on https://www.[facility].org/ meal planning guidelines. Breakfast   cup cooked oatmeal.  1 cup strawberries.  1 cup low-fat milk.  1 oz almonds. Snack  1 cup cucumber slices.  6 oz yogurt (made from low-fat or fat-free milk). Lunch  2 slice whole-wheat bread.  2 oz sliced [place].  2 tsp mayonnaise.  1 cup blueberries.  1 cup snap peas. Snack  6 whole-wheat crackers.  1 oz string cheese. Dinner   cup brown rice.  1 cup mixed veggies.  1 tsp olive oil.  3 oz grilled fish. Document Released: 10/29/2005 Document Revised: 01/21/2012 Document Reviewed: 09/14/2011 [facility] Patient Information 2014 [place], [place]. ore  Reconsider the flu vaccine please   Weight and blood sugar have increased, please work to reduce these  Vit D level and H pylori blood test will be added the result and discussion will bne sent to you. The H pylori may be  related to some of your reflux symptoms  For neck pain, and spasm x 3 weeks, voltaren gel and robaxin are prescribed  Use OTC hydrocorisone cream sparingly to left outer ear when dry, no discoloration present  It is important that you exercise regularly at least 30 minutes 5 times a week. If you develop chest pain, have severe difficulty breathing, or feel very tired, stop exercising immediately and seek medical attention    Weight loss goal of 2.5 to 3 pounds per month  HBA1C and chem 7 in 4 month
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
## 2013-10-19 DIAGNOSIS — Z0000 Encounter for general adult medical examination without abnormal findings: Secondary | ICD-10-CM | POA: Insufficient documentation

## 2013-10-19 NOTE — Assessment & Plan Note (Signed)
Recent inc in diamox dose associated with increased adverse s/e "feels unsteady" weird" reduced the dose independently and will discuss at upcoming appt with neurologist

## 2013-10-19 NOTE — Assessment & Plan Note (Signed)
Severe, feels food up in her chest, needs to reduce caffeine and change eating ha bits. Check H pylori and trial of PPI

## 2013-10-19 NOTE — Assessment & Plan Note (Signed)
Deteriorated Patient educated about the importance of limiting  Carbohydrate intake , the need to commit to daily physical activity for a minimum of 30 minutes , and to commit weight loss. The fact that changes in all these areas will reduce or eliminate all together the development of diabetes is stressed.    
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
## 2013-10-19 NOTE — Assessment & Plan Note (Signed)
Deteriorated. Patient re-educated about  the importance of commitment to a  minimum of 150 minutes of exercise per week. The importance of healthy food choices with portion control discussed. Encouraged to start a food diary, count calories and to consider  joining a support group. Sample diet sheets offered. Goals set by the patient for the next several months.    

## 2013-10-19 NOTE — Assessment & Plan Note (Addendum)
Updated lab to be onbtained as an add on

## 2013-10-19 NOTE — Assessment & Plan Note (Signed)
Controlled, no change in medication DASH diet and commitment to daily physical activity for a minimum of 30 minutes discussed and encouraged, as a part of hypertension management. The importance of attaining a healthy weight is also discussed.  

## 2013-10-19 NOTE — Assessment & Plan Note (Signed)
Annual exam as documented. Pt re educated re the absolute need to commit to healthy lifestyle changes on a consistent basis to improve her health Immunization and screening currently updated

## 2013-10-21 ENCOUNTER — Other Ambulatory Visit: Payer: Self-pay | Admitting: Family Medicine

## 2013-10-21 ENCOUNTER — Encounter: Payer: Self-pay | Admitting: Family Medicine

## 2013-10-21 MED ORDER — AMOXICILL-CLARITHRO-LANSOPRAZ PO MISC: Freq: Two times a day (BID) | ORAL | Status: DC

## 2013-11-25 ENCOUNTER — Telehealth (INDEPENDENT_AMBULATORY_CARE_PROVIDER_SITE_OTHER): Payer: Self-pay

## 2013-11-25 DIAGNOSIS — H4710 Unspecified papilledema: Secondary | ICD-10-CM

## 2013-11-25 MED ORDER — ACETAZOLAMIDE 250 MG PO TABS: 250.0000 mg | ORAL_TABLET | Freq: Two times a day (BID) | ORAL | Status: DC

## 2013-11-25 NOTE — Telephone Encounter (Signed)
Med sent as requested 

## 2014-03-05 ENCOUNTER — Ambulatory Visit: Payer: BC Managed Care – PPO | Admitting: Family Medicine

## 2014-09-10 ENCOUNTER — Encounter: Payer: Self-pay | Admitting: Obstetrics and Gynecology

## 2014-09-10 ENCOUNTER — Ambulatory Visit (INDEPENDENT_AMBULATORY_CARE_PROVIDER_SITE_OTHER): Payer: BC Managed Care – PPO | Admitting: Obstetrics and Gynecology

## 2014-09-10 VITALS — BP 120/78 | Ht 66.0 in | Wt 207.0 lb

## 2014-09-10 DIAGNOSIS — B9689 Other specified bacterial agents as the cause of diseases classified elsewhere: Secondary | ICD-10-CM

## 2014-09-10 DIAGNOSIS — A499 Bacterial infection, unspecified: Secondary | ICD-10-CM

## 2014-09-10 DIAGNOSIS — N760 Acute vaginitis: Secondary | ICD-10-CM

## 2014-09-10 MED ORDER — METRONIDAZOLE 0.75 % VA GEL: 1.0000 | Freq: Every day | VAGINAL | Status: DC

## 2014-09-10 NOTE — Progress Notes (Signed)
Patient ID: Marcia Hahn, female   DOB: [birth year], 38 y.o.   MRN: [id number] Pt here today for a refill on her medication. Pt states that she use metrogel for 2 days after a period. Pt has been using this for about 2 -3 years. Pt states that she just needs the refill and that everything else is going really good.

## 2014-09-10 NOTE — Progress Notes (Signed)
Patient ID: Marcia Hahn, female   DOB: 07-Jul-1976, 39 y.o.   MRN: [id number]   [facility] Visit  Patient name: Marcia Hahn MRN [id number]  Date of birth: 06-04-1976  CC & HPI:  Marcia Hahn is a 38 y.o. female presenting today for a refill on her Metrogel.  She states that she uses the medication two days after her menses.   ROS:  All systems have been reviewed and negative unless otherwise indicated in the HPI.   Pertinent History Reviewed:   Reviewed: Significant for  Medical         Past Medical History  Diagnosis Date   Acne vulgaris    Mild obesity    Hypertension                               Surgical Hx:    Past Surgical History  Procedure Laterality Date   Cesarean section     Medications: Reviewed & Updated - see associated section                      Current outpatient prescriptions:acetaZOLAMIDE (DIAMOX) 250 MG tablet, Take 1 tablet (250 mg total) by mouth 2 (two) times daily., Disp: 60 tablet, Rfl: 3;  ibuprofen (ADVIL,MOTRIN) 800 MG tablet, Take 1 tablet (800 mg total) by mouth 2 (two) times daily. Take 1 tablet by mouth two times a day as needed, Disp: 30 tablet, Rfl: 2   Social History: Reviewed -  reports that she has quit smoking. She has never used smokeless tobacco.  Objective Findings:  Vitals: Blood pressure 120/78, height 5\' 6"  (1.676 m), weight 207 lb (93.895 kg), last menstrual period 08/16/2014.  Physical Examination: Discussion only.   Assessment & Plan:   A: Chronic BV,, controlled with  1. Metrogel refil x 1 yr  P:  As above    This chart was scribed for Jonnie Kind, MD by Donato Schultz, ED Scribe. This patient was seen in Room 1 and the patient's care was started at 9:20 AM.

## 2015-01-25 ENCOUNTER — Encounter (HOSPITAL_COMMUNITY): Payer: Self-pay | Admitting: Emergency Medicine

## 2015-01-25 ENCOUNTER — Emergency Department (HOSPITAL_COMMUNITY)
Admission: EM | Admit: 2015-01-25 | Discharge: 2015-01-25 | Disposition: A | Discharge status: Home / Self Care | Payer: BLUE CROSS/BLUE SHIELD | Attending: Emergency Medicine | Admitting: Emergency Medicine

## 2015-01-25 ENCOUNTER — Emergency Department (HOSPITAL_COMMUNITY): Payer: BLUE CROSS/BLUE SHIELD

## 2015-01-25 DIAGNOSIS — R42 Dizziness and giddiness: Secondary | ICD-10-CM | POA: Diagnosis not present

## 2015-01-25 DIAGNOSIS — Z79899 Other long term (current) drug therapy: Secondary | ICD-10-CM | POA: Diagnosis not present

## 2015-01-25 DIAGNOSIS — Z87891 Personal history of nicotine dependence: Secondary | ICD-10-CM | POA: Insufficient documentation

## 2015-01-25 DIAGNOSIS — R110 Nausea: Secondary | ICD-10-CM | POA: Insufficient documentation

## 2015-01-25 DIAGNOSIS — J40 Bronchitis, not specified as acute or chronic: Secondary | ICD-10-CM | POA: Diagnosis not present

## 2015-01-25 DIAGNOSIS — E669 Obesity, unspecified: Secondary | ICD-10-CM | POA: Insufficient documentation

## 2015-01-25 DIAGNOSIS — Z872 Personal history of diseases of the skin and subcutaneous tissue: Secondary | ICD-10-CM | POA: Diagnosis not present

## 2015-01-25 DIAGNOSIS — I10 Essential (primary) hypertension: Secondary | ICD-10-CM | POA: Insufficient documentation

## 2015-01-25 DIAGNOSIS — R05 Cough: Secondary | ICD-10-CM | POA: Diagnosis present

## 2015-01-25 LAB — COMPREHENSIVE METABOLIC PANEL
ALT: 79 U/L — AB (ref 0–35)
ANION GAP: 6 (ref 5–15)
AST: 50 U/L — ABNORMAL HIGH (ref 0–37)
Albumin: 4.1 g/dL (ref 3.5–5.2)
Alkaline Phosphatase: 46 U/L (ref 39–117)
BUN: 12 mg/dL (ref 6–23)
CHLORIDE: 105 mmol/L (ref 96–112)
CO2: 27 mmol/L (ref 19–32)
Calcium: 8.6 mg/dL (ref 8.4–10.5)
Creatinine, Ser: 0.83 mg/dL (ref 0.50–1.10)
GFR, EST NON AFRICAN AMERICAN: 88 mL/min — AB (ref >90)
Glucose, Bld: 101 mg/dL — ABNORMAL HIGH (ref 70–99)
Potassium: 3.5 mmol/L (ref 3.5–5.1)
SODIUM: 138 mmol/L (ref 135–145)
TOTAL PROTEIN: 7.7 g/dL (ref 6.0–8.3)
Total Bilirubin: 0.4 mg/dL (ref 0.3–1.2)

## 2015-01-25 LAB — CBC WITH DIFFERENTIAL/PLATELET
BASOS PCT: 0 % (ref 0–1)
Basophils Absolute: 0 10*3/uL (ref 0.0–0.1)
Eosinophils Absolute: 0.1 10*3/uL (ref 0.0–0.7)
Eosinophils Relative: 2 % (ref 0–5)
HCT: 35.5 % — ABNORMAL LOW (ref 36.0–46.0)
HEMOGLOBIN: 11.3 g/dL — AB (ref 12.0–15.0)
LYMPHS ABS: 1.6 10*3/uL (ref 0.7–4.0)
Lymphocytes Relative: 27 % (ref 12–46)
MCH: 28.4 pg (ref 26.0–34.0)
MCHC: 31.8 g/dL (ref 30.0–36.0)
MCV: 89.2 fL (ref 78.0–100.0)
Monocytes Absolute: 0.3 10*3/uL (ref 0.1–1.0)
Monocytes Relative: 5 % (ref 3–12)
NEUTROS ABS: 4 10*3/uL (ref 1.7–7.7)
NEUTROS PCT: 66 % (ref 43–77)
Platelets: 333 10*3/uL (ref 150–400)
RBC: 3.98 MIL/uL (ref 3.87–5.11)
RDW: 15 % (ref 11.5–15.5)
WBC: 6 10*3/uL (ref 4.0–10.5)

## 2015-01-25 NOTE — ED Provider Notes (Signed)
CSN: [id number]     Arrival date & time 01/25/15  1635 History  This chart was scribed for Milton Ferguson, MD by Chester Holstein, ED Scribe. This patient was seen in room APA07/APA07 and the patient's care was started at 6:57 PM.    Chief Complaint  Patient presents with  . Shortness of Breath  . Fever  . Dizziness  . Nausea  . Cough    Patient is a 39 y.o. female presenting with cough. The history is provided by the patient (pt complains of mild cough). No language interpreter was used.  Cough Cough characteristics:  Non-productive Severity:  Mild Onset quality:  Sudden Timing:  Intermittent Chronicity:  New Smoker: no   Context: not animal exposure   Associated symptoms: no eye discharge    HPI Comments: Marcia MASELLI is a 39 y.o. female who presents to the Emergency Department complaining of  Intermittent chest tightness, cough, nausea, and dizziness with sudden onset today. Pt notes she had a cold last week which had resolved. Pt took Mucinex during that time for relief. She reports she felt well yesterday. Pt notes associated mild headache which has resolved. Pt denies vomiting.   Past Medical History  Diagnosis Date  . Acne vulgaris   . Mild obesity   . Hypertension    Past Surgical History  Procedure Laterality Date  . Cesarean section     Family History  Problem Relation Age of Onset  . GER disease Mother   . Diabetes Mother   . Hypertension Mother   . Heart attack Father 4  . Migraines Sister    History  Substance Use Topics  . Smoking status: Former Research scientist (life sciences)  . Smokeless tobacco: Never Used  . Alcohol Use: Yes     Comment: occasional   OB History    No data available     Review of Systems  Constitutional: Negative for appetite change and fatigue.  HENT: Negative for ear discharge and sinus pressure.   Eyes: Negative for discharge.  Respiratory: Positive for cough and chest tightness.   Endocrine: Negative for heat intolerance.  Genitourinary:  Negative for frequency and hematuria.  Musculoskeletal: Negative for back pain.  Allergic/Immunologic: Negative for food allergies.  Neurological: Negative for seizures.  Psychiatric/Behavioral: Negative for hallucinations.      Allergies  Review of patient's allergies indicates no known allergies.  Home Medications   Prior to Admission medications   Medication Sig Start Date End Date Taking? Authorizing Provider  acetaminophen (TYLENOL) 500 MG tablet Take 500 mg by mouth every 6 (six) hours as needed.   Yes Historical Provider, MD  acetaZOLAMIDE (DIAMOX) 250 MG tablet Take 1 tablet (250 mg total) by mouth 2 (two) times daily. 11/25/13  Yes Fayrene Helper, MD  Ascorbic Acid (VITAMIN C PO) Take 1 tablet by mouth daily.   Yes Historical Provider, MD  Fish Oil-Cholecalciferol (FISH OIL + D3 PO) Take 1 capsule by mouth daily.   Yes Historical Provider, MD  ibuprofen (ADVIL,MOTRIN) 200 MG tablet Take 200 mg by mouth every 6 (six) hours as needed for headache, mild pain or moderate pain.   Yes Historical Provider, MD  Probiotic Product (PROBIOTIC FORMULA PO) Take 1 tablet by mouth daily.   Yes Historical Provider, MD  VITAMIN D, ERGOCALCIFEROL, PO Take 1 tablet by mouth daily.   Yes Historical Provider, MD  ibuprofen (ADVIL,MOTRIN) 800 MG tablet Take 1 tablet (800 mg total) by mouth 2 (two) times daily. Take 1 tablet by mouth  two times a day as needed Patient not taking: Reported on 01/25/2015 10/14/13   Fayrene Helper, MD  metroNIDAZOLE (METROGEL) 0.75 % vaginal gel Place 1 Applicatorful vaginally at bedtime. Apply one applicatorful to vagina at bedtime for 2-5 days at end of menstrual period Patient not taking: Reported on 01/25/2015 09/10/14   Jonnie Kind, MD   BP 147/90 mmHg  Pulse 68  Temp(Src) 98 F (36.7 C) (Oral)  Resp 20  Ht 5\' 7"  (1.702 m)  Wt 204 lb (92.534 kg)  BMI 31.94 kg/m2  SpO2 100%  LMP 01/01/2015 Physical Exam  Constitutional: She is oriented to person,  place, and time. She appears well-developed.  HENT:  Head: Normocephalic.  Eyes: Conjunctivae and EOM are normal. No scleral icterus.  Neck: Neck supple. No thyromegaly present.  Cardiovascular: Normal rate and regular rhythm.  Exam reveals no gallop and no friction rub.   No murmur heard. Pulmonary/Chest: No stridor. She has no wheezes. She has no rales. She exhibits no tenderness.  Abdominal: She exhibits no distension. There is no tenderness. There is no rebound.  Musculoskeletal: Normal range of motion. She exhibits no edema.  Lymphadenopathy:    She has no cervical adenopathy.  Neurological: She is oriented to person, place, and time. She exhibits normal muscle tone. Coordination normal.  Skin: No rash noted. No erythema.  Psychiatric: She has a normal mood and affect. Her behavior is normal.  Nursing note and vitals reviewed.   ED Course  Procedures (including critical care time) DIAGNOSTIC STUDIES: Oxygen Saturation is 100% on room air, normal by my interpretation.    COORDINATION OF CARE: 7:00 PM Discussed treatment plan with patient at beside, the patient agrees with the plan and has no further questions at this time.   Labs Review Labs Reviewed - No data to display  Imaging Review Dg Chest 2 View  01/25/2015   CLINICAL DATA:  Cough for 1 week  EXAM: CHEST  2 VIEW  COMPARISON:  02/18/2013  FINDINGS: The heart size and mediastinal contours are within normal limits. Both lungs are clear. The visualized skeletal structures are unremarkable.  IMPRESSION: No active cardiopulmonary disease.   Electronically Signed   By: Marybelle Killings M.D.   On: 01/25/2015 17:53     EKG Interpretation None      MDM   Final diagnoses:  None    Bronchitis  The chart was scribed for me under my direct supervision.  I personally performed the history, physical, and medical decision making and all procedures in the evaluation of this patient.Milton Ferguson, MD 01/25/15 [phone]

## 2015-01-25 NOTE — Discharge Instructions (Signed)
Plenty of fluids.  Follow up as needed

## 2015-01-25 NOTE — ED Notes (Signed)
Having cough symptoms since last Wed.  Been treating with OTC meds.  Having increasing pain to chest form coughing.  Having dizziness and nausea.

## 2015-01-31 ENCOUNTER — Telehealth: Payer: Self-pay | Admitting: Family Medicine

## 2015-01-31 NOTE — Telephone Encounter (Signed)
Pls call pt, let her know I am aware she was in te ED this past weekend, has not been here for over 1 year, blood pressure was high in the ED, offer appt for next week pls she need f/u of blood pressure and updated labs, pls schedule if hse agrees

## 2015-02-01 NOTE — Telephone Encounter (Signed)
Left message for patient to call back  

## 2015-02-08 ENCOUNTER — Ambulatory Visit (INDEPENDENT_AMBULATORY_CARE_PROVIDER_SITE_OTHER): Payer: BLUE CROSS/BLUE SHIELD | Admitting: Family Medicine

## 2015-02-08 ENCOUNTER — Encounter: Payer: Self-pay | Admitting: Family Medicine

## 2015-02-08 VITALS — BP 156/104 | HR 72 | Resp 18 | Ht 67.0 in | Wt 211.0 lb

## 2015-02-08 DIAGNOSIS — R7309 Other abnormal glucose: Secondary | ICD-10-CM

## 2015-02-08 DIAGNOSIS — E559 Vitamin D deficiency, unspecified: Secondary | ICD-10-CM

## 2015-02-08 DIAGNOSIS — I10 Essential (primary) hypertension: Secondary | ICD-10-CM | POA: Diagnosis not present

## 2015-02-08 DIAGNOSIS — D509 Iron deficiency anemia, unspecified: Secondary | ICD-10-CM

## 2015-02-08 DIAGNOSIS — J0100 Acute maxillary sinusitis, unspecified: Secondary | ICD-10-CM

## 2015-02-08 DIAGNOSIS — R7303 Prediabetes: Secondary | ICD-10-CM

## 2015-02-08 DIAGNOSIS — Z1159 Encounter for screening for other viral diseases: Secondary | ICD-10-CM

## 2015-02-08 DIAGNOSIS — H4710 Unspecified papilledema: Secondary | ICD-10-CM

## 2015-02-08 DIAGNOSIS — E669 Obesity, unspecified: Secondary | ICD-10-CM

## 2015-02-08 MED ORDER — TRIAMTERENE-HCTZ 37.5-25 MG PO TABS: 1.0000 | ORAL_TABLET | Freq: Every day | ORAL | Status: DC

## 2015-02-08 MED ORDER — FLUCONAZOLE 150 MG PO TABS: ORAL_TABLET | ORAL | Status: DC

## 2015-02-08 MED ORDER — LEVOFLOXACIN 500 MG PO TABS: 500.0000 mg | ORAL_TABLET | Freq: Every day | ORAL | Status: DC

## 2015-02-08 NOTE — Patient Instructions (Signed)
F/u in 4 weeks, call if you need me sooner  New once daily at same time is maxzide for blood pressure  Medication sent for sinus pressure  It is important that you exercise regularly at least 30 minutes 5 times a week. If you develop chest pain, have severe difficulty breathing, or feel very tired, stop exercising immediately and seek medical attention    A healthy diet is rich in fruit, vegetables and whole grains. Poultry fish, nuts and beans are a healthy choice for protein rather then red meat. A low sodium diet and drinking 64 ounces of water daily is generally recommended. Oils and sweet should be limited. Carbohydrates especially for those who are diabetic or overweight, should be limited to 30-45 gram per meal. It is important to eat on a regular schedule, at least 3 times daily. Snacks should be primarily fruits, vegetables or nuts.  You are referred to nutritionist for help with eating]  Weight loss goal per month, 3 to 5 pounds per month  Fasting  Lipid, hepatitis panel, HIV, chem 7, TSh, hBA1C , iron and ferritin and vit D the Saturday before f/u

## 2015-03-05 ENCOUNTER — Other Ambulatory Visit: Payer: Self-pay | Admitting: Family Medicine

## 2015-03-05 NOTE — Assessment & Plan Note (Signed)
Pt needs to re establish with optalmologist esp  As she has d/c the diamox

## 2015-03-05 NOTE — Assessment & Plan Note (Signed)
Updated lab needed at/ before next visit.   

## 2015-03-05 NOTE — Assessment & Plan Note (Signed)
  Deteriorated, referred to nutrtionist Patient educated about the importance of limiting  Carbohydrate intake , the need to commit to daily physical activity for a minimum of 30 minutes , and to commit weight loss. The fact that changes in all these areas will reduce or eliminate all together the development of diabetes is stressed.   Diabetic Labs Latest Ref Rng 01/25/2015 10/12/2013 06/20/2013 11/10/2012 05/19/2012  HbA1c <5.7 % - 5.9(H) 5.7(H) 5.9(H) 6.1(H)  Chol 0 - 200 mg/dL - - 140 - 150  HDL >39 mg/dL - - 36(L) - 39(L)  Calc LDL 0 - 99 mg/dL - - 77 - 90  Triglycerides <150 mg/dL - - 137 - 107  Creatinine 0.50 - 1.10 mg/dL 0.83 - 0.85 - 0.78   BP/Weight 02/08/2015 01/25/2015 09/10/2014 10/14/2013 06/12/2013 06/12/6552 05/15/8269  Systolic BP 786 754 492 010 071 219 758  Diastolic BP 832 96 78 82 90 75 91  Wt. (Lbs) 211 204 207 207.12 205.08 - 199  BMI 33.04 31.94 33.43 33.45 33.12 - 32.13   No flowsheet data found.

## 2015-03-05 NOTE — Progress Notes (Signed)
Subjective:    Patient ID: Marcia Hahn, female    DOB: 30-Sep-1976, 39 y.o.   MRN: [id number]  HPI Pt in for ED f/u where she was recently seen for acute respiratory infection/ bronchitis, she still c/o marked facial pain and yellow / green nasal drainage  With chills, denies fever or productive cough. Has been missing for some time has established diagnosis of hypertension, had been on acetazolamide but stopped this due to adverse s/e , she has not been back to the MD who was treating her with the med , she is advised to do so   Review of Systems See HPI Denies chest pains, palpitations and leg swelling Denies abdominal pain, nausea, vomiting,diarrhea or constipation.   Denies dysuria, frequency, hesitancy or incontinence. Denies joint pain, swelling and limitation in mobility. Denies headaches, seizures, numbness, or tingling. Denies depression, anxiety or insomnia. Denies skin break down or rash.        Objective:   Physical Exam BP 156/104 mmHg  Pulse 72  Resp 18  Ht 5\' 7"  (1.702 m)  Wt 211 lb (95.709 kg)  BMI 33.04 kg/m2  SpO2 98%  LMP 01/01/2015  Patient alert and oriented and in no cardiopulmonary distress.  HEENT: No facial asymmetry, EOMI,   oropharynx pink and moist.  Neck supple no JVD, no mass. Maxillary sinus tenderness, erythema and edema of nasal mucosa, TM clear bilaterally Chest: Clear to auscultation bilaterally.  CVS: S1, S2 no murmurs, no S3.Regular rate.  ABD: Soft non tender.   Ext: No edema  MS: Adequate ROM spine, shoulders, hips and knees.  Skin: Intact, no ulcerations or rash noted.  Psych: Good eye contact, normal affect. Memory intact not anxious or depressed appearing.  CNS: CN 2-12 intact, power,  normal throughout.no focal deficits noted.        Assessment & Plan:  Essential hypertension Uncontrolled, currently on no medication Start daily maxzide DASH diet and commitment to daily physical activity for a minimum of 30  minutes discussed and encouraged, as a part of hypertension management. The importance of attaining a healthy weight is also discussed.  BP/Weight 02/08/2015 01/25/2015 09/10/2014 10/14/2013 06/12/2013 2/42/6834 11/20/6220  Systolic BP 979 892 119 417 408 144 818  Diastolic BP 563 96 78 82 90 75 91  Wt. (Lbs) 211 204 207 207.12 205.08 - 199  BMI 33.04 31.94 33.43 33.45 33.12 - 32.13         Maxillary sinusitis, acute Antibiotic prescribed   Prediabetes  Deteriorated, referred to nutrtionist Patient educated about the importance of limiting  Carbohydrate intake , the need to commit to daily physical activity for a minimum of 30 minutes , and to commit weight loss. The fact that changes in all these areas will reduce or eliminate all together the development of diabetes is stressed.   Diabetic Labs Latest Ref Rng 01/25/2015 10/12/2013 06/20/2013 11/10/2012 05/19/2012  HbA1c <5.7 % - 5.9(H) 5.7(H) 5.9(H) 6.1(H)  Chol 0 - 200 mg/dL - - 140 - 150  HDL >39 mg/dL - - 36(L) - 39(L)  Calc LDL 0 - 99 mg/dL - - 77 - 90  Triglycerides <150 mg/dL - - 137 - 107  Creatinine 0.50 - 1.10 mg/dL 0.83 - 0.85 - 0.78   BP/Weight 02/08/2015 01/25/2015 09/10/2014 10/14/2013 06/12/2013 1/49/7026 01/17/8587  Systolic BP 502 774 128 786 767 209 470  Diastolic BP 962 96 78 82 90 75 91  Wt. (Lbs) 211 204 207 207.12 205.08 - 199  BMI 33.04  31.94 33.43 33.45 33.12 - 32.13   No flowsheet data found.      Obesity (BMI 30.0-34.9) Deteriorated. Patient re-educated about  the importance of commitment to a  minimum of 150 minutes of exercise per week.  The importance of healthy food choices with portion control discussed. Encouraged to start a food diary, count calories and to consider  joining a support group. Sample diet sheets offered. Goals set by the patient for the next several months.   Weight /BMI 02/08/2015 01/25/2015 09/10/2014  WEIGHT 211 lb 204 lb 207 lb  HEIGHT 5\' 7"  5\' 7"  5\' 6"   BMI 33.04 kg/m2 31.94  kg/m2 33.43 kg/m2    Current exercise per week 60 minutes.    Vitamin D deficiency Updated lab needed at/ before next visit.    Papilledema Pt needs to re establish with optalmologist esp  As she has d/c the diamox

## 2015-03-05 NOTE — Assessment & Plan Note (Signed)
Uncontrolled, currently on no medication Start daily maxzide DASH diet and commitment to daily physical activity for a minimum of 30 minutes discussed and encouraged, as a part of hypertension management. The importance of attaining a healthy weight is also discussed.  BP/Weight 02/08/2015 01/25/2015 09/10/2014 10/14/2013 06/12/2013 1/95/9747 11/19/5499  Systolic BP 586 825 749 355 217 471 595  Diastolic BP 396 96 78 82 90 75 91  Wt. (Lbs) 211 204 207 207.12 205.08 - 199  BMI 33.04 31.94 33.43 33.45 33.12 - 32.13

## 2015-03-05 NOTE — Assessment & Plan Note (Signed)
Antibiotic prescribed 

## 2015-03-05 NOTE — Assessment & Plan Note (Signed)
Deteriorated. Patient re-educated about  the importance of commitment to a  minimum of 150 minutes of exercise per week.  The importance of healthy food choices with portion control discussed. Encouraged to start a food diary, count calories and to consider  joining a support group. Sample diet sheets offered. Goals set by the patient for the next several months.   Weight /BMI 02/08/2015 01/25/2015 09/10/2014  WEIGHT 211 lb 204 lb 207 lb  HEIGHT 5\' 7"  5\' 7"  5\' 6"   BMI 33.04 kg/m2 31.94 kg/m2 33.43 kg/m2    Current exercise per week 60 minutes.

## 2015-03-06 LAB — LIPID PANEL
Cholesterol: 144 mg/dL (ref 0–200)
HDL: 37 mg/dL — ABNORMAL LOW (ref >=46)
LDL Cholesterol: 80 mg/dL (ref 0–99)
Total CHOL/HDL Ratio: 3.9 Ratio
Triglycerides: 136 mg/dL (ref <150)
VLDL: 27 mg/dL (ref 0–40)

## 2015-03-06 LAB — HEPATITIS PANEL, ACUTE
HCV Ab: NEGATIVE
HEP A IGM: NONREACTIVE
HEP B S AG: NEGATIVE
Hep B C IgM: NONREACTIVE

## 2015-03-06 LAB — BASIC METABOLIC PANEL
BUN: 18 mg/dL (ref 6–23)
CALCIUM: 9.1 mg/dL (ref 8.4–10.5)
CO2: 26 mEq/L (ref 19–32)
Chloride: 102 mEq/L (ref 96–112)
Creat: 1 mg/dL (ref 0.50–1.10)
Glucose, Bld: 95 mg/dL (ref 70–99)
POTASSIUM: 3.7 meq/L (ref 3.5–5.3)
SODIUM: 139 meq/L (ref 135–145)

## 2015-03-06 LAB — FERRITIN: Ferritin: 9 ng/mL — ABNORMAL LOW (ref 10–291)

## 2015-03-06 LAB — TSH: TSH: 1.582 u[IU]/mL (ref 0.350–4.500)

## 2015-03-06 LAB — IRON: Iron: 83 ug/dL (ref 42–145)

## 2015-03-06 LAB — HIV ANTIBODY (ROUTINE TESTING W REFLEX): HIV: NONREACTIVE

## 2015-03-06 LAB — HEMOGLOBIN A1C
Hgb A1c MFr Bld: 6.1 % — ABNORMAL HIGH (ref <5.7)
MEAN PLASMA GLUCOSE: 128 mg/dL — AB (ref <117)

## 2015-03-07 ENCOUNTER — Encounter: Payer: Self-pay | Admitting: Family Medicine

## 2015-03-07 ENCOUNTER — Ambulatory Visit (INDEPENDENT_AMBULATORY_CARE_PROVIDER_SITE_OTHER): Payer: BLUE CROSS/BLUE SHIELD | Admitting: Family Medicine

## 2015-03-07 VITALS — BP 134/80 | HR 82 | Resp 18 | Ht 67.0 in | Wt 209.0 lb

## 2015-03-07 DIAGNOSIS — R7309 Other abnormal glucose: Secondary | ICD-10-CM

## 2015-03-07 DIAGNOSIS — D649 Anemia, unspecified: Secondary | ICD-10-CM | POA: Diagnosis not present

## 2015-03-07 DIAGNOSIS — R7303 Prediabetes: Secondary | ICD-10-CM

## 2015-03-07 DIAGNOSIS — E559 Vitamin D deficiency, unspecified: Secondary | ICD-10-CM

## 2015-03-07 DIAGNOSIS — E66811 Obesity, class 1: Secondary | ICD-10-CM

## 2015-03-07 DIAGNOSIS — E669 Obesity, unspecified: Secondary | ICD-10-CM

## 2015-03-07 DIAGNOSIS — D509 Iron deficiency anemia, unspecified: Secondary | ICD-10-CM

## 2015-03-07 DIAGNOSIS — N920 Excessive and frequent menstruation with regular cycle: Secondary | ICD-10-CM

## 2015-03-07 DIAGNOSIS — I10 Essential (primary) hypertension: Secondary | ICD-10-CM

## 2015-03-07 DIAGNOSIS — H4710 Unspecified papilledema: Secondary | ICD-10-CM

## 2015-03-07 LAB — VITAMIN D 25 HYDROXY (VIT D DEFICIENCY, FRACTURES): VIT D 25 HYDROXY: 19 ng/mL — AB (ref 30–100)

## 2015-03-07 MED ORDER — ERGOCALCIFEROL 1.25 MG (50000 UT) PO CAPS: 50000.0000 [IU] | ORAL_CAPSULE | ORAL | Status: DC

## 2015-03-07 NOTE — Assessment & Plan Note (Signed)
Pt needs to get appt with opthalmologist to evaluate and treat as necessary, states she will arrange this

## 2015-03-07 NOTE — Assessment & Plan Note (Signed)
Needs weekly vit D supplement will prescribe and notify on my chart

## 2015-03-07 NOTE — Assessment & Plan Note (Signed)
Flooding and clotting for 2 of 6 days, with iron deficiency anemia, refer to gyne for eval and management

## 2015-03-07 NOTE — Assessment & Plan Note (Signed)
Deteriorated. Patient educated about the importance of limiting  Carbohydrate intake , the need to commit to daily physical activity for a minimum of 30 minutes , and to commit weight loss. The fact that changes in all these areas will reduce or eliminate all together the development of diabetes is stressed.   Diabetic Labs Latest Ref Rng 03/05/2015 01/25/2015 10/12/2013 06/20/2013 11/10/2012  HbA1c <5.7 % 6.1(H) - 5.9(H) 5.7(H) 5.9(H)  Chol 0 - 200 mg/dL 144 - - 140 -  HDL >=46 mg/dL 37(L) - - 36(L) -  Calc LDL 0 - 99 mg/dL 80 - - 77 -  Triglycerides <150 mg/dL 136 - - 137 -  Creatinine 0.50 - 1.10 mg/dL 1.00 0.83 - 0.85 -   BP/Weight 03/07/2015 02/08/2015 01/25/2015 09/10/2014 10/14/2013 06/12/2013 12/09/7865  Systolic BP 672 094 709 628 366 294 765  Diastolic BP 80 465 96 78 82 90 75  Wt. (Lbs) 209 211 204 207 207.12 205.08 -  BMI 32.73 33.04 31.94 33.43 33.45 33.12 -   No flowsheet data found.

## 2015-03-07 NOTE — Progress Notes (Signed)
Subjective:    Patient ID: Marcia Hahn, female    DOB: February 05, 1976, 39 y.o.   MRN: [id number]  HPI The PT is here for follow up and re-evaluation of chronic medical conditions, medication management and review of any available recent lab and radiology data.  Preventive health is updated, specifically  Cancer screening and Immunization.   Needs to arrange for eye exam, will check insurance coverage. The PT denies any adverse reactions to current medications since the last visit. Noted stomach pain when taken on empty stomach, but resolved with food There are no new concerns.  There are no specific complaints       Review of Systems    See HPI Denies recent fever or chills. Denies sinus pressure, nasal congestion, ear pain or sore throat. Denies chest congestion, productive cough or wheezing. Denies chest pains, palpitations and leg swelling Denies abdominal pain, nausea, vomiting,diarrhea or constipation.   Denies dysuria, frequency, hesitancy or incontinence.c/o flooding and clotting x 2 of 6 days of menses and is anemic with low iron stores, will refer to gyne for eval and management Denies joint pain, swelling and limitation in mobility. Denies headaches, seizures, numbness, or tingling. Denies depression, anxiety or insomnia. Denies skin break down or rash.     Objective:   Physical Exam BP 134/80 mmHg  Pulse 82  Resp 18  Ht 5\' 7"  (1.702 m)  Wt 209 lb (94.802 kg)  BMI 32.73 kg/m2  SpO2 98% Patient alert and oriented and in no cardiopulmonary distress.  HEENT: No facial asymmetry, EOMI,   oropharynx pink and moist.  Neck supple no JVD, no mass.  Chest: Clear to auscultation bilaterally.  CVS: S1, S2 no murmurs, no S3.Regular rate.  ABD: Soft non tender.   Ext: No edema  MS: Adequate ROM spine, shoulders, hips and knees.  Skin: Intact, no ulcerations or rash noted.  Psych: Good eye contact, normal affect. Memory intact not anxious or depressed  appearing.  CNS: CN 2-12 intact, power,  normal throughout.no focal deficits noted.        Assessment & Plan:  .Essential hypertension Controlled, no change in medication DASH diet and commitment to daily physical activity for a minimum of 30 minutes discussed and encouraged, as a part of hypertension management. The importance of attaining a healthy weight is also discussed.  BP/Weight 03/07/2015 02/08/2015 01/25/2015 09/10/2014 10/14/2013 06/12/2013 9/38/1017  Systolic BP 510 258 527 782 423 536 144  Diastolic BP 80 315 96 78 82 90 75  Wt. (Lbs) 209 211 204 207 207.12 205.08 -  BMI 32.73 33.04 31.94 33.43 33.45 33.12 -         Prediabetes Deteriorated. Patient educated about the importance of limiting  Carbohydrate intake , the need to commit to daily physical activity for a minimum of 30 minutes , and to commit weight loss. The fact that changes in all these areas will reduce or eliminate all together the development of diabetes is stressed.   Diabetic Labs Latest Ref Rng 03/05/2015 01/25/2015 10/12/2013 06/20/2013 11/10/2012  HbA1c <5.7 % 6.1(H) - 5.9(H) 5.7(H) 5.9(H)  Chol 0 - 200 mg/dL 144 - - 140 -  HDL >=46 mg/dL 37(L) - - 36(L) -  Calc LDL 0 - 99 mg/dL 80 - - 77 -  Triglycerides <150 mg/dL 136 - - 137 -  Creatinine 0.50 - 1.10 mg/dL 1.00 0.83 - 0.85 -   BP/Weight 03/07/2015 02/08/2015 01/25/2015 09/10/2014 10/14/2013 06/12/2013 4/00/8676  Systolic BP 195 093 267 124  117 356 701  Diastolic BP 80 410 96 78 82 90 75  Wt. (Lbs) 209 211 204 207 207.12 205.08 -  BMI 32.73 33.04 31.94 33.43 33.45 33.12 -   No flowsheet data found.      Obesity (BMI 30.0-34.9) Deteriorated. Patient re-educated about  the importance of commitment to a  minimum of 150 minutes of exercise per week.  The importance of healthy food choices with portion control discussed. Encouraged to start a food diary, count calories and to consider  joining a support group. Sample diet sheets offered. Goals set  by the patient for the next several months.   Weight /BMI 03/07/2015 02/08/2015 01/25/2015  WEIGHT 209 lb 211 lb 204 lb  HEIGHT 5\' 7"  5\' 7"  5\' 7"   BMI 32.73 kg/m2 33.04 kg/m2 31.94 kg/m2    Current exercise per week 60 minutes.    Menorrhagia with regular cycle Flooding and clotting for 2 of 6 days, with iron deficiency anemia, refer to gyne for eval and management   Vitamin D deficiency Needs weekly vit D supplement will prescribe and notify on my chart   Papilledema Pt needs to get appt with opthalmologist to evaluate and treat as necessary, states she will arrange this

## 2015-03-07 NOTE — Assessment & Plan Note (Signed)
Controlled, no change in medication DASH diet and commitment to daily physical activity for a minimum of 30 minutes discussed and encouraged, as a part of hypertension management. The importance of attaining a healthy weight is also discussed.  BP/Weight 03/07/2015 02/08/2015 01/25/2015 09/10/2014 10/14/2013 06/12/2013 7/93/9688  Systolic BP 648 472 072 182 883 374 451  Diastolic BP 80 460 96 78 82 90 75  Wt. (Lbs) 209 211 204 207 207.12 205.08 -  BMI 32.73 33.04 31.94 33.43 33.45 33.12 -

## 2015-03-07 NOTE — Assessment & Plan Note (Signed)
Deteriorated. Patient re-educated about  the importance of commitment to a  minimum of 150 minutes of exercise per week.  The importance of healthy food choices with portion control discussed. Encouraged to start a food diary, count calories and to consider  joining a support group. Sample diet sheets offered. Goals set by the patient for the next several months.   Weight /BMI 03/07/2015 02/08/2015 01/25/2015  WEIGHT 209 lb 211 lb 204 lb  HEIGHT 5\' 7"  5\' 7"  5\' 7"   BMI 32.73 kg/m2 33.04 kg/m2 31.94 kg/m2    Current exercise per week 60 minutes.

## 2015-03-07 NOTE — Patient Instructions (Addendum)
F/u in 4 month, call if you need mme before  You are referred to diabetic educator  You are referred to Dr Glo Herring re heavy cycles and iron deficiency  Pls continue to commit to daily exercise for 30 mins minimum  BP today is good stay on sam medication dose  I will send send  chart message about liver enzymes  Continue maxzide one daily please  Pls make appt with eye specialist  HAPPY B/DAY!  Thanks for choosing [facility], we consider it a privelige to serve you.  HBA1C, cmp and EGFr non fast in 4 month, also cBC iron and ferritin  Take OTC iron 325 mg one daily

## 2015-03-09 ENCOUNTER — Encounter: Payer: Self-pay | Admitting: Family Medicine

## 2015-03-09 LAB — HEPATIC FUNCTION PANEL
ALBUMIN: 4.2 g/dL (ref 3.5–5.2)
ALT: 21 U/L (ref 0–35)
AST: 17 U/L (ref 0–37)
Alkaline Phosphatase: 45 U/L (ref 39–117)
BILIRUBIN TOTAL: 0.3 mg/dL (ref 0.2–1.2)
Bilirubin, Direct: 0.1 mg/dL (ref 0.0–0.3)
Indirect Bilirubin: 0.2 mg/dL (ref 0.2–1.2)
Total Protein: 7.2 g/dL (ref 6.0–8.3)

## 2015-03-21 ENCOUNTER — Encounter: Payer: Self-pay | Admitting: Obstetrics and Gynecology

## 2015-03-21 ENCOUNTER — Telehealth: Payer: Self-pay | Admitting: Family Medicine

## 2015-03-21 NOTE — Telephone Encounter (Signed)
pls call this pt and relay her "unread tele msg, she needs the vit D pls resend, thanks

## 2015-03-22 NOTE — Telephone Encounter (Signed)
msg left for patient

## 2015-06-15 ENCOUNTER — Telehealth: Payer: Self-pay | Admitting: Family Medicine

## 2015-06-15 ENCOUNTER — Other Ambulatory Visit: Payer: Self-pay | Admitting: Family Medicine

## 2015-06-15 DIAGNOSIS — H4710 Unspecified papilledema: Secondary | ICD-10-CM

## 2015-06-15 DIAGNOSIS — H53459 Other localized visual field defect, unspecified eye: Secondary | ICD-10-CM

## 2015-06-15 NOTE — Telephone Encounter (Signed)
Please see referral to [facility] opthalmology

## 2015-06-27 ENCOUNTER — Telehealth: Payer: Self-pay | Admitting: *Deleted

## 2015-06-27 DIAGNOSIS — Z0100 Encounter for examination of eyes and vision without abnormal findings: Secondary | ICD-10-CM

## 2015-06-27 NOTE — Telephone Encounter (Signed)
Referral entered  

## 2015-06-27 NOTE — Telephone Encounter (Signed)
Pt called wants to be referred to the [facility] on [address] in [place]

## 2015-07-07 ENCOUNTER — Ambulatory Visit: Payer: BLUE CROSS/BLUE SHIELD | Admitting: Family Medicine

## 2015-07-07 ENCOUNTER — Encounter: Payer: Self-pay | Admitting: *Deleted

## 2015-09-06 ENCOUNTER — Other Ambulatory Visit: Payer: Self-pay | Admitting: Family Medicine

## 2015-10-12 ENCOUNTER — Encounter: Payer: Self-pay | Admitting: Family Medicine

## 2015-11-10 ENCOUNTER — Other Ambulatory Visit: Payer: Self-pay | Admitting: Obstetrics and Gynecology

## 2016-03-09 ENCOUNTER — Other Ambulatory Visit: Payer: Self-pay | Admitting: Family Medicine

## 2017-04-18 ENCOUNTER — Other Ambulatory Visit: Payer: Self-pay | Admitting: Family Medicine

## 2017-04-18 NOTE — Telephone Encounter (Signed)
Last seen 4.25.16.

## 2017-06-11 ENCOUNTER — Ambulatory Visit (INDEPENDENT_AMBULATORY_CARE_PROVIDER_SITE_OTHER): Payer: BLUE CROSS/BLUE SHIELD | Admitting: Family Medicine

## 2017-06-11 ENCOUNTER — Encounter: Payer: Self-pay | Admitting: Family Medicine

## 2017-06-11 ENCOUNTER — Other Ambulatory Visit (HOSPITAL_COMMUNITY)
Admission: RE | Admit: 2017-06-11 | Discharge: 2017-06-11 | Disposition: A | Discharge status: Home / Self Care | Payer: BLUE CROSS/BLUE SHIELD | Source: Ambulatory Visit | Attending: Family Medicine | Admitting: Family Medicine

## 2017-06-11 VITALS — BP 158/100 | HR 88 | Resp 16 | Ht 67.0 in | Wt 216.0 lb

## 2017-06-11 DIAGNOSIS — B9689 Other specified bacterial agents as the cause of diseases classified elsewhere: Secondary | ICD-10-CM | POA: Insufficient documentation

## 2017-06-11 DIAGNOSIS — Z0000 Encounter for general adult medical examination without abnormal findings: Secondary | ICD-10-CM | POA: Insufficient documentation

## 2017-06-11 DIAGNOSIS — N760 Acute vaginitis: Secondary | ICD-10-CM | POA: Insufficient documentation

## 2017-06-11 DIAGNOSIS — E8881 Metabolic syndrome: Secondary | ICD-10-CM

## 2017-06-11 DIAGNOSIS — E669 Obesity, unspecified: Secondary | ICD-10-CM

## 2017-06-11 DIAGNOSIS — E559 Vitamin D deficiency, unspecified: Secondary | ICD-10-CM

## 2017-06-11 DIAGNOSIS — Z1231 Encounter for screening mammogram for malignant neoplasm of breast: Secondary | ICD-10-CM

## 2017-06-11 DIAGNOSIS — R7303 Prediabetes: Secondary | ICD-10-CM

## 2017-06-11 DIAGNOSIS — E785 Hyperlipidemia, unspecified: Secondary | ICD-10-CM | POA: Diagnosis not present

## 2017-06-11 DIAGNOSIS — Z124 Encounter for screening for malignant neoplasm of cervix: Secondary | ICD-10-CM | POA: Insufficient documentation

## 2017-06-11 DIAGNOSIS — D500 Iron deficiency anemia secondary to blood loss (chronic): Secondary | ICD-10-CM | POA: Diagnosis not present

## 2017-06-11 DIAGNOSIS — R87610 Atypical squamous cells of undetermined significance on cytologic smear of cervix (ASC-US): Secondary | ICD-10-CM | POA: Diagnosis not present

## 2017-06-11 DIAGNOSIS — D539 Nutritional anemia, unspecified: Secondary | ICD-10-CM

## 2017-06-11 DIAGNOSIS — E66811 Obesity, class 1: Secondary | ICD-10-CM

## 2017-06-11 DIAGNOSIS — I10 Essential (primary) hypertension: Secondary | ICD-10-CM

## 2017-06-11 DIAGNOSIS — Z1211 Encounter for screening for malignant neoplasm of colon: Secondary | ICD-10-CM | POA: Diagnosis not present

## 2017-06-11 LAB — CBC
HCT: 33.5 % — ABNORMAL LOW (ref 35.0–45.0)
Hemoglobin: 10.7 g/dL — ABNORMAL LOW (ref 11.7–15.5)
MCH: 27.5 pg (ref 27.0–33.0)
MCHC: 31.9 g/dL — ABNORMAL LOW (ref 32.0–36.0)
MCV: 86.1 fL (ref 80.0–100.0)
MPV: 9.5 fL (ref 7.5–12.5)
Platelets: 402 10*3/uL — ABNORMAL HIGH (ref 140–400)
RBC: 3.89 MIL/uL (ref 3.80–5.10)
RDW: 16.2 % — AB (ref 11.0–15.0)
WBC: 5 10*3/uL (ref 3.8–10.8)

## 2017-06-11 LAB — POC HEMOCCULT BLD/STL (OFFICE/1-CARD/DIAGNOSTIC): Fecal Occult Blood, POC: NEGATIVE

## 2017-06-11 LAB — TSH: TSH: 1.42 mIU/L

## 2017-06-11 MED ORDER — TRIAMTERENE-HCTZ 37.5-25 MG PO TABS: 1.0000 | ORAL_TABLET | Freq: Every day | ORAL | 1 refills | Status: DC

## 2017-06-11 NOTE — Assessment & Plan Note (Signed)
Annual exam as documented. Counseling done  re healthy lifestyle involving commitment to 150 minutes exercise per week, heart healthy diet, and attaining healthy weight.The importance of adequate sleep also discussed.  Immunization and cancer screening needs are specifically addressed at this visit.  

## 2017-06-11 NOTE — Progress Notes (Signed)
Marcia Hahn     MRN: [id number]      DOB: 1976/02/03  HPI: Patient is in for annual physical exam. She has stopped her antihypertensive and blood pressure is elevated. Labs are 3 years past due , she has a/o IDA, hyperlipidemia and IGT, all these are discussed , the implications of untreated problems and she will have follow up of any abnormal labs obtained  She has concerns re repeated BV , but douches all the time, wanted to remove her IUD , but is counselled against this , information provided re BV and the need to stop douching . Immunization is reviewed , and  updated if needed.   PE: BP (!) 158/100   Pulse 88   Resp 16   Ht 5\' 7"  (1.702 m)   Wt 216 lb (98 kg)   LMP 05/31/2017 (Approximate)   SpO2 96%   BMI 33.83 kg/m   Pleasant  female, alert and oriented x 3, in no cardio-pulmonary distress. Afebrile. HEENT No facial trauma or asymetry. Sinuses non tender.  Extra occullar muscles intact,  External ears normal, tympanic membranes clear. Oropharynx moist, no exudate. Neck: supple, no adenopathy,JVD or thyromegaly.No bruits.  Chest: Clear to ascultation bilaterally.No crackles or wheezes. Non tender to palpation  Breast: No asymetry,no masses or lumps. No tenderness. No nipple discharge or inversion. No axillary or supraclavicular adenopathy  Cardiovascular system; Heart sounds normal,  S1 and  S2 ,no S3.  No murmur, or thrill. Apical beat not displaced Peripheral pulses normal.  Abdomen: Soft, non tender, no organomegaly or masses. No bruits. Bowel sounds normal. No guarding, tenderness or rebound.  Rectal:  Normal sphincter tone. No rectal mass. Guaiac negative stool.  GU: External genitalia normal female genitalia , normal female distribution of hair. No lesions. Urethral meatus normal in size, no  Prolapse, no lesions visibly  Present. Bladder non tender. Vagina pink and moist , with no visible lesions , fishy discharge present . Adequate  pelvic support no  cystocele or rectocele noted Cervix pink and appears healthy, no lesions or ulcerations noted, fishy discharge noted from os, IUD string in place Uterus normal size, no adnexal masses, no cervical motion or adnexal tenderness.   Musculoskeletal exam: Full ROM of spine, hips , shoulders and knees. No deformity ,swelling or crepitus noted. No muscle wasting or atrophy.   Neurologic: Cranial nerves 2 to 12 intact. Power, tone ,sensation and reflexes normal throughout. No disturbance in gait. No tremor.  Skin: Intact, no ulceration, erythema , scaling or rash noted. Pigmentation normal throughout  Psych; Normal mood and affect. Judgement and concentration normal   Assessment & Plan:  Annual physical exam Annual exam as documented. Counseling done  re healthy lifestyle involving commitment to 150 minutes exercise per week, heart healthy diet, and attaining healthy weight.The importance of adequate sleep also discussed.  Immunization and cancer screening needs are specifically addressed at this visit.   Essential hypertension Uncontrolled due to non compliance Resume medication  DASH diet and commitment to daily physical activity for a minimum of 30 minutes discussed and encouraged, as a part of hypertension management. The importance of attaining a healthy weight is also discussed.  BP/Weight 06/11/2017 03/07/2015 02/08/2015 01/25/2015 09/10/2014 96/05/8937 1/0/1751  Systolic BP 025 852 778 242 353 614 431  Diastolic BP 540 80 086 96 78 82 90  Wt. (Lbs) 216 209 211 204 207 207.12 205.08  BMI 33.83 32.73 33.04 31.94 33.43 33.45 33.12     Nurse  BP check in 6 weeks  Obesity (BMI 30.0-34.9) Deteriorated. Patient re-educated about  the importance of commitment to a  minimum of 150 minutes of exercise per week.  The importance of healthy food choices with portion control discussed. Encouraged to start a food diary, count calories and to consider  joining a  support group. Sample diet sheets offered. Goals set by the patient for the next several months.   Weight /BMI 06/11/2017 03/07/2015 02/08/2015  WEIGHT 216 lb 209 lb 211 lb  HEIGHT 5\' 7"  5\' 7"  5\' 7"   BMI 33.83 kg/m2 32.73 kg/m2 33.04 kg/m2      Prediabetes Patient educated about the importance of limiting  Carbohydrate intake , the need to commit to daily physical activity for a minimum of 30 minutes , and to commit weight loss. The fact that changes in all these areas will reduce or eliminate all together the development of diabetes is stressed.  Updated lab needed at/ before next visit.   Diabetic Labs Latest Ref Rng & Units 06/11/2017 03/05/2015 01/25/2015 10/12/2013 06/20/2013  HbA1c <5.7 % - 6.1(H) - 5.9(H) 5.7(H)  Chol <200 mg/dL 163 144 - - 140  HDL >50 mg/dL 39(L) 37(L) - - 36(L)  Calc LDL <100 mg/dL 103(H) 80 - - 77  Triglycerides <150 mg/dL 107 136 - - 137  Creatinine 0.50 - 1.10 mg/dL 0.81 1.00 0.83 - 0.85   BP/Weight 06/11/2017 03/07/2015 02/08/2015 01/25/2015 09/10/2014 28/02/1323 4/0/1027  Systolic BP 253 664 403 474 259 563 875  Diastolic BP 643 80 329 96 78 82 90  Wt. (Lbs) 216 209 211 204 207 207.12 205.08  BMI 33.83 32.73 33.04 31.94 33.43 33.45 33.12   No flowsheet data found.    IDA (iron deficiency anemia) Needs to take twice daily iron 325 mg , repeat in 4 months  Vitamin D deficiency Needs weekly vit D  Dyslipidemia (high LDL; low HDL) Hyperlipidemia:Low fat diet discussed and encouraged.   Lipid Panel  Lab Results  Component Value Date   CHOL 163 06/11/2017   HDL 39 (L) 06/11/2017   LDLCALC 103 (H) 06/11/2017   TRIG 107 06/11/2017   CHOLHDL 4.2 51/88/4166       Metabolic syndrome X The increased risk of cardiovascular disease associated with this diagnosis, and the need to consistently work on lifestyle to change this is discussed. Following  a  heart healthy diet ,commitment to 30 minutes of exercise at least 5 days per week, as well as control  of blood sugar and cholesterol , and achieving a healthy weight are all the areas to be addressed .   Vulvovaginitis Fishy discharge with h/o douching, likely BV , specimen sent for testing and pt education done

## 2017-06-11 NOTE — Patient Instructions (Addendum)
Nurse BP check in 5 weeks  MD follow up early December  New for blood pressure is once daily maxzide , take every day at same time  Please sched mammogeram at checkout  It is important that you exercise regularly at least 30 minutes 7 times a week. If you develop chest pain, have severe difficulty breathing, or feel very tired, stop exercising immediately and seek medical attention      Labs today, cBC, iron, ferritin, lipid, cmp and eGFr, HBA1C, TSH and vit D  T You are referred to Dr Merlene Laughter for evaluation for sleep apnea  Work at getting 5 to 8 hrs of sleep daily    Novinger stands for "Dietary Approaches to Stop Hypertension." The DASH eating plan is a healthy eating plan that has been shown to reduce high blood pressure (hypertension). It may also reduce your risk for type 2 diabetes, heart disease, and stroke. The DASH eating plan may also help with weight loss. What are tips for following this plan? General guidelines  Avoid eating more than 2,300 mg (milligrams) of salt (sodium) a day. If you have hypertension, you may need to reduce your sodium intake to 1,500 mg a day.  Limit alcohol intake to no more than 1 drink a day for nonpregnant women and 2 drinks a day for men. One drink equals 12 oz of beer, 5 oz of wine, or 1 oz of hard liquor.  Work with your health care provider to maintain a healthy body weight or to lose weight. Ask what an ideal weight is for you.  Get at least 30 minutes of exercise that causes your heart to beat faster (aerobic exercise) most days of the week. Activities may include walking, swimming, or biking.  Work with your health care provider or diet and nutrition specialist (dietitian) to adjust your eating plan to your individual calorie needs. Reading food labels  Check food labels for the amount of sodium per serving. Choose foods with less than 5 percent of the Daily Value of sodium. Generally, foods with less than 300 mg of  sodium per serving fit into this eating plan.  To find whole grains, look for the word "whole" as the first word in the ingredient list. Shopping  Buy products labeled as "low-sodium" or "no salt added."  Buy fresh foods. Avoid canned foods and premade or frozen meals. Cooking  Avoid adding salt when cooking. Use salt-free seasonings or herbs instead of table salt or sea salt. Check with your health care provider or pharmacist before using salt substitutes.  Do not fry foods. Cook foods using healthy methods such as baking, boiling, grilling, and broiling instead.  Cook with heart-healthy oils, such as olive, canola, soybean, or sunflower oil. Meal planning   Eat a balanced diet that includes: ? 5 or more servings of fruits and vegetables each day. At each meal, try to fill half of your plate with fruits and vegetables. ? Up to 6-8 servings of whole grains each day. ? Less than 6 oz of lean meat, poultry, or fish each day. A 3-oz serving of meat is about the same size as a deck of cards. One egg equals 1 oz. ? 2 servings of low-fat dairy each day. ? A serving of nuts, seeds, or beans 5 times each week. ? Heart-healthy fats. Healthy fats called Omega-3 fatty acids are found in foods such as flaxseeds and coldwater fish, like sardines, salmon, and mackerel.  Limit how much you eat  of the following: ? Canned or prepackaged foods. ? Food that is high in trans fat, such as fried foods. ? Food that is high in saturated fat, such as fatty meat. ? Sweets, desserts, sugary drinks, and other foods with added sugar. ? Full-fat dairy products.  Do not salt foods before eating.  Try to eat at least 2 vegetarian meals each week.  Eat more home-cooked food and less restaurant, buffet, and fast food.  When eating at a restaurant, ask that your food be prepared with less salt or no salt, if possible. What foods are recommended? The items listed may not be a complete list. Talk with your  dietitian about what dietary choices are best for you. Grains Whole-grain or whole-wheat bread. Whole-grain or whole-wheat pasta. Brown rice. [place] Morrow. Bulgur. Whole-grain and low-sodium cereals. Pita bread. Low-fat, low-sodium crackers. Whole-wheat flour tortillas. Vegetables Fresh or frozen vegetables (raw, steamed, roasted, or grilled). Low-sodium or reduced-sodium tomato and vegetable juice. Low-sodium or reduced-sodium tomato sauce and tomato paste. Low-sodium or reduced-sodium canned vegetables. Fruits All fresh, dried, or frozen fruit. Canned fruit in natural juice (without added sugar). Meat and other protein foods Skinless chicken or [place]. Ground chicken or [place]. Pork with fat trimmed off. Fish and seafood. Egg whites. Dried beans, peas, or lentils. Unsalted nuts, nut butters, and seeds. Unsalted canned beans. Lean cuts of beef with fat trimmed off. Low-sodium, lean deli meat. Dairy Low-fat (1%) or fat-free (skim) milk. Fat-free, low-fat, or reduced-fat cheeses. Nonfat, low-sodium ricotta or cottage cheese. Low-fat or nonfat yogurt. Low-fat, low-sodium cheese. Fats and oils Soft margarine without trans fats. Vegetable oil. Low-fat, reduced-fat, or light mayonnaise and salad dressings (reduced-sodium). Canola, safflower, olive, soybean, and sunflower oils. Avocado. Seasoning and other foods Herbs. Spices. Seasoning mixes without salt. Unsalted popcorn and pretzels. Fat-free sweets. What foods are not recommended? The items listed may not be a complete list. Talk with your dietitian about what dietary choices are best for you. Grains Baked goods made with fat, such as croissants, muffins, or some breads. Dry pasta or rice meal packs. Vegetables Creamed or fried vegetables. Vegetables in a cheese sauce. Regular canned vegetables (not low-sodium or reduced-sodium). Regular canned tomato sauce and paste (not low-sodium or reduced-sodium). Regular tomato and vegetable juice (not  low-sodium or reduced-sodium). Angie Fava. Olives. Fruits Canned fruit in a light or heavy syrup. Fried fruit. Fruit in cream or butter sauce. Meat and other protein foods Fatty cuts of meat. Ribs. Fried meat. Berniece Salines. Sausage. Bologna and other processed lunch meats. Salami. Fatback. Hotdogs. Bratwurst. Salted nuts and seeds. Canned beans with added salt. Canned or smoked fish. Whole eggs or egg yolks. Chicken or [place] with skin. Dairy Whole or 2% milk, cream, and half-and-half. Whole or full-fat cream cheese. Whole-fat or sweetened yogurt. Full-fat cheese. Nondairy creamers. Whipped toppings. Processed cheese and cheese spreads. Fats and oils Butter. Stick margarine. Lard. Shortening. Ghee. Bacon fat. Tropical oils, such as coconut, palm kernel, or palm oil. Seasoning and other foods Salted popcorn and pretzels. Onion salt, garlic salt, seasoned salt, table salt, and sea salt. Worcestershire sauce. Tartar sauce. Barbecue sauce. Teriyaki sauce. Soy sauce, including reduced-sodium. Steak sauce. Canned and packaged gravies. Fish sauce. Oyster sauce. Cocktail sauce. Horseradish that you find on the shelf. Ketchup. Mustard. Meat flavorings and tenderizers. Bouillon cubes. Hot sauce and Tabasco sauce. Premade or packaged marinades. Premade or packaged taco seasonings. Relishes. Regular salad dressings. Where to find more information:  National Heart, Lung, and Blood Institute: https://wilson-eaton.com/  American Heart  Association: www.heart.org Summary  The DASH eating plan is a healthy eating plan that has been shown to reduce high blood pressure (hypertension). It may also reduce your risk for type 2 diabetes, heart disease, and stroke.  With the DASH eating plan, you should limit salt (sodium) intake to 2,300 mg a day. If you have hypertension, you may need to reduce your sodium intake to 1,500 mg a day.  When on the DASH eating plan, aim to eat more fresh fruits and vegetables, whole grains, lean proteins,  low-fat dairy, and heart-healthy fats.  Work with your health care provider or diet and nutrition specialist (dietitian) to adjust your eating plan to your individual calorie needs. This information is not intended to replace advice given to you by your health care provider. Make sure you discuss any questions you have with your health care provider. Document Released: 10/18/2011 Document Revised: 10/22/2016 Document Reviewed: 10/22/2016 Elsevier Interactive Patient Education  2017 Elsevier Inc.  Bacterial Vaginosis Bacterial vaginosis is an infection of the vagina. It happens when too many germs (bacteria) grow in the vagina. This infection puts you at risk for infections from sex (STIs). Treating this infection can lower your risk for some STIs. You should also treat this if you are pregnant. It can cause your baby to be born early. Follow these instructions at home: Medicines  Take over-the-counter and prescription medicines only as told by your doctor.  Take or use your antibiotic medicine as told by your doctor. Do not stop taking or using it even if you start to feel better. General instructions  If you your sexual partner is a woman, tell her that you have this infection. She needs to get treatment if she has symptoms. If you have a female partner, he does not need to be treated.  During treatment: ? Avoid sex. ? Do not douche. ? Avoid alcohol as told. ? Avoid breastfeeding as told.  Drink enough fluid to keep your pee (urine) clear or pale yellow.  Keep your vagina and butt (rectum) clean. ? Wash the area with warm water every day. ? Wipe from front to back after you use the toilet.  Keep all follow-up visits as told by your doctor. This is important. Preventing this condition  Do not douche.  Use only warm water to wash around your vagina.  Use protection when you have sex. This includes: ? Latex condoms. ? Dental dams.  Limit how many people you have sex with. It is  best to only have sex with the same person (be monogamous).  Get tested for STIs. Have your partner get tested.  Wear underwear that is cotton or lined with cotton.  Avoid tight pants and pantyhose. This is most important in summer.  Do not use any products that have nicotine or tobacco in them. These include cigarettes and e-cigarettes. If you need help quitting, ask your doctor.  Do not use illegal drugs.  Limit how much alcohol you drink. Contact a doctor if:  Your symptoms do not get better, even after you are treated.  You have more discharge or pain when you pee (urinate).  You have a fever.  You have pain in your belly (abdomen).  You have pain with sex.  Your bleed from your vagina between periods. Summary  This infection happens when too many germs (bacteria) grow in the vagina.  Treating this condition can lower your risk for some infections from sex (STIs).  You should also treat this if you  are pregnant. It can cause early (premature) birth.  Do not stop taking or using your antibiotic medicine even if you start to feel better. This information is not intended to replace advice given to you by your health care provider. Make sure you discuss any questions you have with your health care provider. Document Released: 08/07/2008 Document Revised: 07/14/2016 Document Reviewed: 07/14/2016 Elsevier Interactive Patient Education  2017 Elsevier Inc.  Sleep Apnea Sleep apnea is a condition that affects breathing. People with sleep apnea have moments during sleep when their breathing pauses briefly or gets shallow. Sleep apnea can cause these symptoms:  Trouble staying asleep.  Sleepiness or tiredness during the day.  Irritability.  Loud snoring.  Morning headaches.  Trouble concentrating.  Forgetting things.  Less interest in sex.  Being sleepy for no reason.  Mood swings.  Personality changes.  Depression.  Waking up a lot during the night to pee  (urinate).  Dry mouth.  Sore throat.  Follow these instructions at home:  Make any changes in your routine that your doctor recommends.  Eat a healthy, well-balanced diet.  Take over-the-counter and prescription medicines only as told by your doctor.  Avoid using alcohol, calming medicines (sedatives), and narcotic medicines.  Take steps to lose weight if you are overweight.  If you were given a machine (device) to use while you sleep, use it only as told by your doctor.  Do not use any tobacco products, such as cigarettes, chewing tobacco, and e-cigarettes. If you need help quitting, ask your doctor.  Keep all follow-up visits as told by your doctor. This is important. Contact a doctor if:  The machine that you were given to use during sleep is uncomfortable or does not seem to be working.  Your symptoms do not get better.  Your symptoms get worse. Get help right away if:  Your chest hurts.  You have trouble breathing in enough air (shortness of breath).  You have an uncomfortable feeling in your back, arms, or stomach.  You have trouble talking.  One side of your body feels weak.  A part of your face is hanging down (drooping). These symptoms may be an emergency. Do not wait to see if the symptoms will go away. Get medical help right away. Call your local emergency services (911 in the [place] Do not drive yourself to the hospital. This information is not intended to replace advice given to you by your health care provider. Make sure you discuss any questions you have with your health care provider. Document Released: 08/07/2008 Document Revised: 06/24/2016 Document Reviewed: 08/08/2015 Elsevier Interactive Patient Education  [employer].

## 2017-06-12 ENCOUNTER — Other Ambulatory Visit: Payer: Self-pay | Admitting: Family Medicine

## 2017-06-12 ENCOUNTER — Encounter: Payer: Self-pay | Admitting: Family Medicine

## 2017-06-12 DIAGNOSIS — R519 Headache, unspecified: Secondary | ICD-10-CM

## 2017-06-12 DIAGNOSIS — D509 Iron deficiency anemia, unspecified: Secondary | ICD-10-CM

## 2017-06-12 DIAGNOSIS — R51 Headache: Secondary | ICD-10-CM

## 2017-06-12 DIAGNOSIS — E8881 Metabolic syndrome: Secondary | ICD-10-CM | POA: Insufficient documentation

## 2017-06-12 DIAGNOSIS — R0683 Snoring: Secondary | ICD-10-CM

## 2017-06-12 DIAGNOSIS — R400 Somnolence: Secondary | ICD-10-CM

## 2017-06-12 DIAGNOSIS — E785 Hyperlipidemia, unspecified: Secondary | ICD-10-CM

## 2017-06-12 HISTORY — DX: Hyperlipidemia, unspecified: E78.5

## 2017-06-12 HISTORY — DX: Iron deficiency anemia, unspecified: D50.9

## 2017-06-12 LAB — COMPREHENSIVE METABOLIC PANEL
ALT: 19 U/L (ref 6–29)
AST: 20 U/L (ref 10–30)
Albumin: 4.1 g/dL (ref 3.6–5.1)
Alkaline Phosphatase: 50 U/L (ref 33–115)
BUN: 15 mg/dL (ref 7–25)
CALCIUM: 8.8 mg/dL (ref 8.6–10.2)
CO2: 23 mmol/L (ref 20–31)
Chloride: 104 mmol/L (ref 98–110)
Creat: 0.81 mg/dL (ref 0.50–1.10)
GLUCOSE: 87 mg/dL (ref 65–99)
Potassium: 3.4 mmol/L — ABNORMAL LOW (ref 3.5–5.3)
Sodium: 141 mmol/L (ref 135–146)
TOTAL PROTEIN: 7 g/dL (ref 6.1–8.1)
Total Bilirubin: 0.3 mg/dL (ref 0.2–1.2)

## 2017-06-12 LAB — LIPID PANEL
CHOLESTEROL: 163 mg/dL (ref <200)
HDL: 39 mg/dL — ABNORMAL LOW (ref >50)
LDL Cholesterol: 103 mg/dL — ABNORMAL HIGH (ref <100)
Total CHOL/HDL Ratio: 4.2 Ratio (ref <5.0)
Triglycerides: 107 mg/dL (ref <150)
VLDL: 21 mg/dL (ref <30)

## 2017-06-12 LAB — HEMOGLOBIN A1C
Hgb A1c MFr Bld: 5.6 % (ref <5.7)
Mean Plasma Glucose: 114 mg/dL

## 2017-06-12 LAB — VITAMIN D 25 HYDROXY (VIT D DEFICIENCY, FRACTURES): Vit D, 25-Hydroxy: 20 ng/mL — ABNORMAL LOW (ref 30–100)

## 2017-06-12 LAB — FERRITIN: Ferritin: 5 ng/mL — ABNORMAL LOW (ref 10–232)

## 2017-06-12 LAB — IRON: IRON: 38 ug/dL — AB (ref 40–190)

## 2017-06-12 MED ORDER — IRON 325 (65 FE) MG PO TABS: ORAL_TABLET | ORAL | 11 refills | Status: DC

## 2017-06-12 MED ORDER — ERGOCALCIFEROL 1.25 MG (50000 UT) PO CAPS: 50000.0000 [IU] | ORAL_CAPSULE | ORAL | 1 refills | Status: DC

## 2017-06-12 NOTE — Assessment & Plan Note (Signed)
Needs to take twice daily iron 325 mg , repeat in 4 months

## 2017-06-12 NOTE — Assessment & Plan Note (Signed)
Uncontrolled due to non compliance Resume medication  DASH diet and commitment to daily physical activity for a minimum of 30 minutes discussed and encouraged, as a part of hypertension management. The importance of attaining a healthy weight is also discussed.  BP/Weight 06/11/2017 03/07/2015 02/08/2015 01/25/2015 09/10/2014 35/03/2173 05/12/5952  Systolic BP 967 289 791 504 136 438 377  Diastolic BP 939 80 688 96 78 82 90  Wt. (Lbs) 216 209 211 204 207 207.12 205.08  BMI 33.83 32.73 33.04 31.94 33.43 33.45 33.12     Nurse BP check in 6 weeks

## 2017-06-12 NOTE — Assessment & Plan Note (Signed)
Needs weekly vit D 

## 2017-06-12 NOTE — Assessment & Plan Note (Signed)
Patient educated about the importance of limiting  Carbohydrate intake , the need to commit to daily physical activity for a minimum of 30 minutes , and to commit weight loss. The fact that changes in all these areas will reduce or eliminate all together the development of diabetes is stressed.  Updated lab needed at/ before next visit.   Diabetic Labs Latest Ref Rng & Units 06/11/2017 03/05/2015 01/25/2015 10/12/2013 06/20/2013  HbA1c <5.7 % - 6.1(H) - 5.9(H) 5.7(H)  Chol <200 mg/dL 163 144 - - 140  HDL >50 mg/dL 39(L) 37(L) - - 36(L)  Calc LDL <100 mg/dL 103(H) 80 - - 77  Triglycerides <150 mg/dL 107 136 - - 137  Creatinine 0.50 - 1.10 mg/dL 0.81 1.00 0.83 - 0.85   BP/Weight 06/11/2017 03/07/2015 02/08/2015 01/25/2015 09/10/2014 66/03/9934 7/0/1779  Systolic BP 390 300 923 300 762 263 335  Diastolic BP 456 80 256 96 78 82 90  Wt. (Lbs) 216 209 211 204 207 207.12 205.08  BMI 33.83 32.73 33.04 31.94 33.43 33.45 33.12   No flowsheet data found.

## 2017-06-12 NOTE — Assessment & Plan Note (Signed)
Deteriorated. Patient re-educated about  the importance of commitment to a  minimum of 150 minutes of exercise per week.  The importance of healthy food choices with portion control discussed. Encouraged to start a food diary, count calories and to consider  joining a support group. Sample diet sheets offered. Goals set by the patient for the next several months.   Weight /BMI 06/11/2017 03/07/2015 02/08/2015  WEIGHT 216 lb 209 lb 211 lb  HEIGHT 5\' 7"  5\' 7"  5\' 7"   BMI 33.83 kg/m2 32.73 kg/m2 33.04 kg/m2

## 2017-06-12 NOTE — Assessment & Plan Note (Signed)
Hyperlipidemia:Low fat diet discussed and encouraged.   Lipid Panel  Lab Results  Component Value Date   CHOL 163 06/11/2017   HDL 39 (L) 06/11/2017   LDLCALC 103 (H) 06/11/2017   TRIG 107 06/11/2017   CHOLHDL 4.2 06/11/2017

## 2017-06-12 NOTE — Assessment & Plan Note (Signed)
The increased risk of cardiovascular disease associated with this diagnosis, and the need to consistently work on lifestyle to change this is discussed. Following  a  heart healthy diet ,commitment to 30 minutes of exercise at least 5 days per week, as well as control of blood sugar and cholesterol , and achieving a healthy weight are all the areas to be addressed .  

## 2017-06-12 NOTE — Assessment & Plan Note (Signed)
Fishy discharge with h/o douching, likely BV , specimen sent for testing and pt education done

## 2017-06-14 ENCOUNTER — Encounter: Payer: Self-pay | Admitting: Family Medicine

## 2017-06-14 ENCOUNTER — Other Ambulatory Visit: Payer: Self-pay | Admitting: Family Medicine

## 2017-06-14 LAB — CYTOLOGY - PAP
Bacterial vaginitis: POSITIVE — AB
CANDIDA VAGINITIS: NEGATIVE
Diagnosis: UNDETERMINED — AB
HPV: NOT DETECTED
TRICH (WINDOWPATH): NEGATIVE

## 2017-06-14 MED ORDER — METRONIDAZOLE 500 MG PO TABS: 500.0000 mg | ORAL_TABLET | Freq: Two times a day (BID) | ORAL | 0 refills | Status: DC

## 2017-07-16 ENCOUNTER — Ambulatory Visit: Payer: BLUE CROSS/BLUE SHIELD

## 2017-07-16 VITALS — BP 136/82

## 2017-07-16 DIAGNOSIS — I10 Essential (primary) hypertension: Secondary | ICD-10-CM

## 2017-07-16 NOTE — Progress Notes (Signed)
Patient came in for BP check, has taken medications roughly 4 hours prior to arrival.

## 2017-09-25 ENCOUNTER — Telehealth: Payer: Self-pay | Admitting: *Deleted

## 2017-09-25 ENCOUNTER — Ambulatory Visit
Admission: RE | Admit: 2017-09-25 | Discharge: 2017-09-25 | Disposition: A | Discharge status: Home / Self Care | Payer: BLUE CROSS/BLUE SHIELD | Source: Ambulatory Visit | Attending: Family Medicine | Admitting: Family Medicine

## 2017-09-25 DIAGNOSIS — E669 Obesity, unspecified: Secondary | ICD-10-CM

## 2017-09-25 DIAGNOSIS — R7303 Prediabetes: Secondary | ICD-10-CM

## 2017-09-25 DIAGNOSIS — Z1231 Encounter for screening mammogram for malignant neoplasm of breast: Secondary | ICD-10-CM

## 2017-09-25 NOTE — Telephone Encounter (Signed)
Patient called requesting a referral to the nutritionist, per patient this was discussed with Dr Moshe Cipro but the nutritionist told her she will need a referral.

## 2017-09-26 ENCOUNTER — Other Ambulatory Visit: Payer: Self-pay | Admitting: Family Medicine

## 2017-09-26 DIAGNOSIS — R928 Other abnormal and inconclusive findings on diagnostic imaging of breast: Secondary | ICD-10-CM

## 2017-09-26 NOTE — Telephone Encounter (Signed)
Referral entered  

## 2017-10-02 ENCOUNTER — Other Ambulatory Visit: Payer: BLUE CROSS/BLUE SHIELD

## 2017-10-07 ENCOUNTER — Other Ambulatory Visit: Payer: BLUE CROSS/BLUE SHIELD

## 2017-10-09 ENCOUNTER — Other Ambulatory Visit: Payer: Self-pay | Admitting: Family Medicine

## 2017-10-09 ENCOUNTER — Ambulatory Visit
Admission: RE | Admit: 2017-10-09 | Discharge: 2017-10-09 | Disposition: A | Discharge status: Home / Self Care | Payer: BLUE CROSS/BLUE SHIELD | Source: Ambulatory Visit | Attending: Family Medicine | Admitting: Family Medicine

## 2017-10-09 ENCOUNTER — Other Ambulatory Visit: Payer: BLUE CROSS/BLUE SHIELD

## 2017-10-09 DIAGNOSIS — R922 Inconclusive mammogram: Secondary | ICD-10-CM | POA: Diagnosis not present

## 2017-10-09 DIAGNOSIS — N6312 Unspecified lump in the right breast, upper inner quadrant: Secondary | ICD-10-CM | POA: Diagnosis not present

## 2017-10-09 DIAGNOSIS — N6489 Other specified disorders of breast: Secondary | ICD-10-CM | POA: Diagnosis not present

## 2017-10-09 DIAGNOSIS — R928 Other abnormal and inconclusive findings on diagnostic imaging of breast: Secondary | ICD-10-CM

## 2017-10-09 DIAGNOSIS — N6311 Unspecified lump in the right breast, upper outer quadrant: Secondary | ICD-10-CM | POA: Diagnosis not present

## 2017-10-10 ENCOUNTER — Other Ambulatory Visit: Payer: Self-pay | Admitting: Family Medicine

## 2017-10-10 ENCOUNTER — Ambulatory Visit
Admission: RE | Admit: 2017-10-10 | Discharge: 2017-10-10 | Disposition: A | Discharge status: Home / Self Care | Payer: BLUE CROSS/BLUE SHIELD | Source: Ambulatory Visit | Attending: Family Medicine | Admitting: Family Medicine

## 2017-10-10 DIAGNOSIS — R928 Other abnormal and inconclusive findings on diagnostic imaging of breast: Secondary | ICD-10-CM

## 2017-10-10 DIAGNOSIS — D242 Benign neoplasm of left breast: Secondary | ICD-10-CM | POA: Diagnosis not present

## 2017-10-14 ENCOUNTER — Other Ambulatory Visit: Payer: BLUE CROSS/BLUE SHIELD

## 2017-10-17 ENCOUNTER — Ambulatory Visit: Payer: BLUE CROSS/BLUE SHIELD | Admitting: Obstetrics and Gynecology

## 2017-10-17 ENCOUNTER — Ambulatory Visit: Payer: BLUE CROSS/BLUE SHIELD | Admitting: Skilled Nursing Facility1

## 2017-10-17 ENCOUNTER — Encounter: Payer: Self-pay | Admitting: Obstetrics and Gynecology

## 2017-10-17 ENCOUNTER — Ambulatory Visit: Payer: BLUE CROSS/BLUE SHIELD | Admitting: Family Medicine

## 2017-10-17 VITALS — BP 150/92 | HR 73 | Ht 65.25 in | Wt 215.6 lb

## 2017-10-17 DIAGNOSIS — N920 Excessive and frequent menstruation with regular cycle: Secondary | ICD-10-CM

## 2017-10-17 NOTE — Progress Notes (Signed)
   [facility] Visit  @DATE @            Patient name: Marcia Hahn MRN [id number]  Date of birth: 01-Aug-1976  CC & HPI:  [name] is a 41 y.o. female presenting today for initiation of care here.  She specifically has a problem with recurrent BV which she is learning to manage by using MetroGel 1-2 times after each menses partner does not require therapy  ROS:  ROS   Pertinent History Reviewed:   Reviewed: Significant for  n/a Medical         Past Medical History:  Diagnosis Date  . Acne vulgaris   . Hypertension   . Mild obesity                               Surgical Hx:    Past Surgical History:  Procedure Laterality Date  . BREAST BIOPSY Left   . CESAREAN SECTION     Medications: Reviewed & Updated - see associated section                       Current Outpatient Medications:  .  ergocalciferol (VITAMIN D2) 50000 units capsule, Take 1 capsule (50,000 Units total) by mouth once a week. One capsule once weekly, Disp: 12 capsule, Rfl: 1 .  Ferrous Sulfate (IRON) 325 (65 Fe) MG TABS, One tablet three times daily, Disp: 90 each, Rfl: 11 .  triamterene-hydrochlorothiazide (MAXZIDE-25) 37.5-25 MG tablet, Take 1 tablet by mouth daily., Disp: 90 tablet, Rfl: 1   Social History: Reviewed -  reports that she has quit smoking. Her smoking use included cigarettes. she has never used smokeless tobacco.  Objective Findings:  Vitals: Blood pressure (!) 150/92, pulse 73, height 5' 5.25" (1.657 m), weight 215 lb 9.6 oz (97.8 kg), last menstrual period 10/15/2017.  Physical Examination: General appearance - alert, well appearing, and in no distress and normal appearing weight Mental status - alert, oriented to person, place, and time Eyes - pupils equal and reactive, extraocular eye movements intact   Assessment & Plan:   A: BV, chronic  IUD use, copper IUD times almost 10 years   P: Continue MetroGel use x2 treatments after each menses given  prescription

## 2017-11-01 ENCOUNTER — Encounter: Payer: Self-pay | Admitting: Registered"

## 2017-11-01 ENCOUNTER — Encounter: Payer: BLUE CROSS/BLUE SHIELD | Attending: Family Medicine | Admitting: Registered"

## 2017-11-01 DIAGNOSIS — Z713 Dietary counseling and surveillance: Secondary | ICD-10-CM | POA: Diagnosis not present

## 2017-11-01 DIAGNOSIS — R7303 Prediabetes: Secondary | ICD-10-CM | POA: Insufficient documentation

## 2017-11-01 DIAGNOSIS — E669 Obesity, unspecified: Secondary | ICD-10-CM | POA: Diagnosis not present

## 2017-11-01 NOTE — Patient Instructions (Addendum)
Make sure to get in three meals per day. Try to have balanced meals like the My Plate example (see handout). Try to include more vegetables, fruits, and whole grains at meals.     https://whatscooking.CustomizedRugs.fi -website to help with meal planning and recipes.   Practice Mindful Eating  At meal and snack times, put away electronics (TV, phone, tablet, etc.) and try to eat seated at a table so you can better focus on eating your meal/snack and promote listening to your body's fullness and hunger signals.  If you feel that you are wanting to snack because your are bored or due to emotions and not because you are hungry, try to do a fun activity (read a book, take a walk, talk with a friend, etc.) for at least 20 minutes.   Make physical activity a part of your week. Try to include at least 30 minutes of physical activity 5 days each week or at least 150 minutes per week. Regular physical activity promotes overall health-including helping to reduce risk for heart disease and diabetes, promoting mental health, and helping [place] sleep better.

## 2017-11-01 NOTE — Progress Notes (Signed)
Medical Nutrition Therapy:  Appt start time: 1030 end time:  1130.   Assessment:  Primary concerns today: Pt referred for weight management and prediabetes. Pt reports she would like help with creating a healthy eating regimen that can be continued long-term to help her maintain/promote overall health.  Food Allergies/Intolerances: Canned tomatoes-pt reports they cause her to break out in hives. Pt reports she can have fresh tomatoes as well as fresh tomatoes that are cooked as long as they are not from a can.   Preferred Learning Style:  No preference indicated   Learning Readiness:   Ready  MEDICATIONS: See list.    DIETARY INTAKE:  Usual eating pattern includes ~2 meals per day and several snacks throughout the day. Often skips lunch. Pt reports sometimes snacking due to hunger, but other times due to boredom. Meals eaten at home are eaten in kitchen at the table most of the time, but sometimes in recliner watching TV if a special show is playing.   Everyday foods vary.  Avoided foods include lima beans, black eyed peas, liver.    24-hr recall: Typical day as pt recently had wisdom teeth taken out and has not been eating per usual over the past day.  B ( AM):1  fried eggs with cheese, 1 slice of bacon, 1 piece of white toast or biscuit  Snk ( AM): None reported.  L ( PM): Usually will only eat fruit cup, piece of fruit, OR potato chips for lunch as pt reports not being very hungry.  Snk ( PM): None reported.  D ( PM): Might be baked chicken with cooked vegetables or salad OR might be fast food OR a sandwich Snk ( PM): corn chips with nacho cheese OR ice cream  sometimes OR potato chips  Beverages: 4-5 bottles of water usually, will sometimes have 1-2 cans of soda in a day, drinks 1.5-2 cups coffee with creamer each day.   Usual physical activity: Likes to go to the gym and walk, but work schedule has made being active difficult. Pt reports she has not included any regular physical  activity over past month.   Progress Towards Goal(s):  In progress.   Nutritional Diagnosis:  NI-5.11.1 Predicted suboptimal nutrient intake As related to skipping meals and inadequate intake of vegetables and fiber .  As evidenced by pt's reported dietary recall and habits .    Intervention:  Nutrition counseling provided. Provided education regarding the relationship between dietary intake, blood sugar, and insulin resistance. Discussed pt's last lab values. Provided education regarding balanced nutrition and the importance of getting in regular meals/not skipping meals. Provided education on the positive effects of physical activity on blood sugar management. Discussed importance of focusing on healthy habits-balanced nutrition and regular activity rather than weight. Discussed strategies for getting in activity (walking around in stores when too cold to walk outdoors/unable to go to gym, etc.). Provided education on mindful eating. Provided education on reading nutrition labels. Pt appeared agreeable to information/goals discussed.   Goals/Instructions:   Make sure to get in three meals per day. Try to have balanced meals like the My Plate example (see handout). Try to include more vegetables, fruits, and whole grains at meals.   https://whatscooking.CustomizedRugs.fi -website to help with meal planning and recipes.   Practice Mindful Eating  At meal and snack times, put away electronics (TV, phone, tablet, etc.) and try to eat seated at a table so you can better focus on eating your meal/snack and promote listening to  your body's fullness and hunger signals.  If you feel that you are wanting to snack because your are bored or due to emotions and not because you are hungry, try to do a fun activity (read a book, take a walk, talk with a friend, etc.) for at least 20 minutes.   Make physical activity a part of your week. Try to include at least 30 minutes of physical activity 5 days each week or  at least 150 minutes per week. Regular physical activity promotes overall health-including helping to reduce risk for heart disease and diabetes, promoting mental health, and helping [place] sleep better.    Teaching Method Utilized:  Visual Auditory  Handouts given during visit include:  Balanced plate with food group.   Snack Sheet   How to Read a Nutrition Label   Barriers to learning/adherence to lifestyle change: None indicated.   Demonstrated degree of understanding via:  Teach Back   Monitoring/Evaluation:  Dietary intake, exercise,  and body weight in 4 month(s).

## 2018-01-15 ENCOUNTER — Ambulatory Visit: Payer: BLUE CROSS/BLUE SHIELD | Admitting: Orthopedic Surgery

## 2018-01-16 DIAGNOSIS — M25531 Pain in right wrist: Secondary | ICD-10-CM | POA: Diagnosis not present

## 2018-01-16 DIAGNOSIS — G5601 Carpal tunnel syndrome, right upper limb: Secondary | ICD-10-CM | POA: Diagnosis not present

## 2018-01-16 DIAGNOSIS — G5602 Carpal tunnel syndrome, left upper limb: Secondary | ICD-10-CM | POA: Diagnosis not present

## 2018-01-16 DIAGNOSIS — M25532 Pain in left wrist: Secondary | ICD-10-CM | POA: Diagnosis not present

## 2018-02-17 ENCOUNTER — Ambulatory Visit: Payer: BLUE CROSS/BLUE SHIELD | Admitting: Registered"

## 2018-06-08 ENCOUNTER — Other Ambulatory Visit: Payer: Self-pay | Admitting: Family Medicine

## 2018-09-03 ENCOUNTER — Ambulatory Visit (INDEPENDENT_AMBULATORY_CARE_PROVIDER_SITE_OTHER): Payer: BLUE CROSS/BLUE SHIELD | Admitting: Family Medicine

## 2018-09-03 ENCOUNTER — Encounter: Payer: Self-pay | Admitting: Family Medicine

## 2018-09-03 VITALS — BP 160/105 | HR 72 | Resp 12 | Ht 67.0 in | Wt 209.1 lb

## 2018-09-03 DIAGNOSIS — Z0000 Encounter for general adult medical examination without abnormal findings: Secondary | ICD-10-CM

## 2018-09-03 DIAGNOSIS — Z1231 Encounter for screening mammogram for malignant neoplasm of breast: Secondary | ICD-10-CM

## 2018-09-03 DIAGNOSIS — D500 Iron deficiency anemia secondary to blood loss (chronic): Secondary | ICD-10-CM | POA: Diagnosis not present

## 2018-09-03 DIAGNOSIS — E669 Obesity, unspecified: Secondary | ICD-10-CM

## 2018-09-03 DIAGNOSIS — E785 Hyperlipidemia, unspecified: Secondary | ICD-10-CM

## 2018-09-03 DIAGNOSIS — I10 Essential (primary) hypertension: Secondary | ICD-10-CM

## 2018-09-03 DIAGNOSIS — R928 Other abnormal and inconclusive findings on diagnostic imaging of breast: Secondary | ICD-10-CM

## 2018-09-03 DIAGNOSIS — E8881 Metabolic syndrome: Secondary | ICD-10-CM

## 2018-09-03 DIAGNOSIS — E559 Vitamin D deficiency, unspecified: Secondary | ICD-10-CM | POA: Diagnosis not present

## 2018-09-03 HISTORY — DX: Other abnormal and inconclusive findings on diagnostic imaging of breast: R92.8

## 2018-09-03 MED ORDER — TRIAMTERENE-HCTZ 75-50 MG PO TABS: 1.0000 | ORAL_TABLET | Freq: Every day | ORAL | 1 refills | Status: DC

## 2018-09-03 NOTE — Progress Notes (Signed)
Marcia Hahn     MRN: [id number]      DOB: 1976/05/11  HPI: Patient is in for annual physical exam. Needs fasting labs Has not been dilgent with lifestyle change with no weight loss Immunization is reviewed , and  Updated Has regular eye exams, no raised ICP, and vision is good.   PE: BP (!) 160/105   Pulse 72   Resp 12   Ht 5\' 7"  (1.702 m)   Wt 209 lb 1.9 oz (94.9 kg)   SpO2 98% Comment: room air  BMI 32.75 kg/m  Pleasant  female, alert and oriented x 3, in no cardio-pulmonary distress. Afebrile. HEENT No facial trauma or asymetry. Sinuses non tender.  Extra occullar muscles intact, pupils equally reactive to light. External ears normal, tympanic membranes clear. Oropharynx moist, no exudate. Neck: supple, no adenopathy,JVD or thyromegaly.No bruits.  Chest: Clear to ascultation bilaterally.No crackles or wheezes. Non tender to palpation  Breast: No asymetry,no masses or lumps. No tenderness. No nipple discharge or inversion. No axillary or supraclavicular adenopathy  Cardiovascular system; Heart sounds normal,  S1 and  S2 ,no S3.  No murmur, or thrill. Apical beat not displaced Peripheral pulses normal.  Abdomen: Soft, non tender, no organomegaly or masses. No bruits. Bowel sounds normal. No guarding, tenderness or rebound.  .   Musculoskeletal exam: Full ROM of spine, hips , shoulders and knees. No deformity ,swelling or crepitus noted. No muscle wasting or atrophy.   Neurologic: Cranial nerves 2 to 12 intact. Power, tone ,sensation and reflexes normal throughout. No disturbance in gait. No tremor.  Skin: Intact, no ulceration, erythema , scaling or rash noted. Pigmentation normal throughout  Psych; Normal mood and affect. Judgement and concentration normal   Assessment & Plan:  Annual physical exam Annual exam as documented. Counseling done  re healthy lifestyle involving commitment to 150 minutes exercise per week, heart healthy diet,  and attaining healthy weight.The importance of adequate sleep. Changes in health habits are decided on by the patient with goals and time frames  set for achieving them. Immunization and cancer screening needs are specifically addressed at this visit.   Essential hypertension Uncontrolled, increase dose of maxzide DASH diet and commitment to daily physical activity for a minimum of 30 minutes discussed and encouraged, as a part of hypertension management. The importance of attaining a healthy weight is also discussed.  BP/Weight 09/03/2018 10/17/2017 07/16/2017 06/11/2017 03/07/2015 02/08/2015 02/02/4009  Systolic BP 272 536 644 034 742 595 638  Diastolic BP 756 92 82 433 80 104 96  Wt. (Lbs) 209.12 215.6 - 216 209 211 204  BMI 32.75 35.6 - 33.83 32.73 33.04 31.94     F/u in 6 weeks  Obesity (BMI 30.0-34.9) Slightly improved. Patient re-educated about  the importance of commitment to a  minimum of 150 minutes of exercise per week.  The importance of healthy food choices with portion control discussed. Encouraged to start a food diary, count calories and to consider  joining a support group. Sample diet sheets offered. Goals set by the patient for the next several months.   Weight /BMI 09/03/2018 10/17/2017 06/11/2017  WEIGHT 209 lb 1.9 oz 215 lb 9.6 oz 216 lb  HEIGHT 5\' 7"  5' 5.25" 5\' 7"   BMI 32.75 kg/m2 35.6 kg/m2 33.83 kg/m2      Abnormal mammogram of right breast Abnormal right breast mammogram in 09/2017 bx negative , rrept in 6 months recommended , but f/u not done Needs diag mammo asap

## 2018-09-03 NOTE — Assessment & Plan Note (Signed)
Annual exam as documented. Counseling done  re healthy lifestyle involving commitment to 150 minutes exercise per week, heart healthy diet, and attaining healthy weight.The importance of adequate sleep Changes in health habits are decided on by the patient with goals and time frames  set for achieving them. Immunization and cancer screening needs are specifically addressed at this visit.  

## 2018-09-03 NOTE — Assessment & Plan Note (Signed)
Slightly improved. Patient re-educated about  the importance of commitment to a  minimum of 150 minutes of exercise per week.  The importance of healthy food choices with portion control discussed. Encouraged to start a food diary, count calories and to consider  joining a support group. Sample diet sheets offered. Goals set by the patient for the next several months.   Weight /BMI 09/03/2018 10/17/2017 06/11/2017  WEIGHT 209 lb 1.9 oz 215 lb 9.6 oz 216 lb  HEIGHT 5\' 7"  5' 5.25" 5\' 7"   BMI 32.75 kg/m2 35.6 kg/m2 33.83 kg/m2

## 2018-09-03 NOTE — Assessment & Plan Note (Signed)
Abnormal right breast mammogram in 09/2017 bx negative , rrept in 6 months recommended , but f/u not done Needs diag mammo asap

## 2018-09-03 NOTE — Assessment & Plan Note (Signed)
Uncontrolled, increase dose of maxzide DASH diet and commitment to daily physical activity for a minimum of 30 minutes discussed and encouraged, as a part of hypertension management. The importance of attaining a healthy weight is also discussed.  BP/Weight 09/03/2018 10/17/2017 07/16/2017 06/11/2017 03/07/2015 02/08/2015 7/79/3968  Systolic BP 864 847 207 218 288 337 445  Diastolic BP 146 92 82 047 80 104 96  Wt. (Lbs) 209.12 215.6 - 216 209 211 204  BMI 32.75 35.6 - 33.83 32.73 33.04 31.94     F/u in 6 weeks

## 2018-09-03 NOTE — Patient Instructions (Addendum)
F/u in first week in December call if you need me  Before, EKG at the December visit  Need diagnostic mammogram , this is past due, call/ contact us by midday tomorrow if you do not have appt info, it will v be scheduled for [place] or [place] soonest available  Fasting  CBC, ;lipid,p and EGFR, TSH and vit D iron and ferritin and B12asap and cmo andnEGFR  Blood pressure is high, new higher dose of triamterene to be started and is sent  You may take one extra trrianteren 37.5 mg tablet today     It is important that you exercise regularly at least 30 minutes 5 times a week. If you develop chest pain, have severe difficulty breathing, or feel very tired, stop exercising immediately and seek medical attention  Please work on good  health habits so that your health will improve. 1. Commitment to daily physical activity for 30 to 60  minutes, if you are able to do this.  2. Commitment to wise food choices. Aim for half of your  food intake to be vegetable and fruit, one quarter starchy foods, and one quarter protein. Try to eat on a regular schedule  3 meals per day, snacking between meals should be limited to vegetables or fruits or small portions of nuts. 64 ounces of water per day is generally recommended, unless you have specific health conditions, like heart failure or kidney failure where you will need to limit fluid intake.  3. Commitment to sufficient and a  good quality of physical and mental rest daily, generally between 6 to 8 hours per day.  WITH PERSISTANCE AND PERSEVERANCE, THE IMPOSSIBLE , BECOMES THE NORM! Weight loss goal of 2 punds /month at least

## 2018-09-04 ENCOUNTER — Encounter: Payer: Self-pay | Admitting: Family Medicine

## 2018-09-08 ENCOUNTER — Other Ambulatory Visit: Payer: Self-pay | Admitting: Family Medicine

## 2018-09-08 DIAGNOSIS — R928 Other abnormal and inconclusive findings on diagnostic imaging of breast: Secondary | ICD-10-CM

## 2018-09-09 ENCOUNTER — Encounter (HOSPITAL_COMMUNITY): Payer: BLUE CROSS/BLUE SHIELD

## 2018-09-16 DIAGNOSIS — D500 Iron deficiency anemia secondary to blood loss (chronic): Secondary | ICD-10-CM | POA: Diagnosis not present

## 2018-09-16 DIAGNOSIS — E559 Vitamin D deficiency, unspecified: Secondary | ICD-10-CM | POA: Diagnosis not present

## 2018-09-16 DIAGNOSIS — E785 Hyperlipidemia, unspecified: Secondary | ICD-10-CM | POA: Diagnosis not present

## 2018-09-16 DIAGNOSIS — I10 Essential (primary) hypertension: Secondary | ICD-10-CM | POA: Diagnosis not present

## 2018-09-16 DIAGNOSIS — E8881 Metabolic syndrome: Secondary | ICD-10-CM | POA: Diagnosis not present

## 2018-09-17 ENCOUNTER — Other Ambulatory Visit: Payer: Self-pay | Admitting: Family Medicine

## 2018-09-17 ENCOUNTER — Encounter: Payer: Self-pay | Admitting: Family Medicine

## 2018-09-17 LAB — COMPLETE METABOLIC PANEL WITH GFR
AG Ratio: 1.4 (calc) (ref 1.0–2.5)
ALKALINE PHOSPHATASE (APISO): 41 U/L (ref 33–115)
ALT: 12 U/L (ref 6–29)
AST: 16 U/L (ref 10–30)
Albumin: 4.2 g/dL (ref 3.6–5.1)
BUN: 22 mg/dL (ref 7–25)
CALCIUM: 9 mg/dL (ref 8.6–10.2)
CO2: 31 mmol/L (ref 20–32)
CREATININE: 0.91 mg/dL (ref 0.50–1.10)
Chloride: 103 mmol/L (ref 98–110)
GFR, Est African American: 90 mL/min/{1.73_m2} (ref >=60)
GFR, Est Non African American: 78 mL/min/{1.73_m2} (ref >=60)
GLUCOSE: 93 mg/dL (ref 65–99)
Globulin: 2.9 g/dL (calc) (ref 1.9–3.7)
Potassium: 3.3 mmol/L — ABNORMAL LOW (ref 3.5–5.3)
Sodium: 141 mmol/L (ref 135–146)
Total Bilirubin: 0.3 mg/dL (ref 0.2–1.2)
Total Protein: 7.1 g/dL (ref 6.1–8.1)

## 2018-09-17 LAB — CBC
HCT: 33.7 % — ABNORMAL LOW (ref 35.0–45.0)
Hemoglobin: 11.2 g/dL — ABNORMAL LOW (ref 11.7–15.5)
MCH: 29 pg (ref 27.0–33.0)
MCHC: 33.2 g/dL (ref 32.0–36.0)
MCV: 87.3 fL (ref 80.0–100.0)
MPV: 10.5 fL (ref 7.5–12.5)
Platelets: 371 10*3/uL (ref 140–400)
RBC: 3.86 10*6/uL (ref 3.80–5.10)
RDW: 13.8 % (ref 11.0–15.0)
WBC: 6.1 10*3/uL (ref 3.8–10.8)

## 2018-09-17 LAB — IRON: IRON: 55 ug/dL (ref 40–190)

## 2018-09-17 LAB — LIPID PANEL
CHOL/HDL RATIO: 3.8 (calc) (ref <5.0)
Cholesterol: 143 mg/dL (ref <200)
HDL: 38 mg/dL — ABNORMAL LOW (ref >50)
LDL CHOLESTEROL (CALC): 82 mg/dL
Non-HDL Cholesterol (Calc): 105 mg/dL (calc) (ref <130)
Triglycerides: 126 mg/dL (ref <150)

## 2018-09-17 LAB — VITAMIN B12: Vitamin B-12: 383 pg/mL (ref 200–1100)

## 2018-09-17 LAB — TSH: TSH: 1.17 m[IU]/L

## 2018-09-17 LAB — FERRITIN: FERRITIN: 8 ng/mL — AB (ref 16–232)

## 2018-09-17 LAB — VITAMIN D 25 HYDROXY (VIT D DEFICIENCY, FRACTURES): Vit D, 25-Hydroxy: 15 ng/mL — ABNORMAL LOW (ref 30–100)

## 2018-09-17 MED ORDER — ERGOCALCIFEROL 1.25 MG (50000 UT) PO CAPS: 50000.0000 [IU] | ORAL_CAPSULE | ORAL | 3 refills | Status: DC

## 2018-09-17 MED ORDER — POTASSIUM CHLORIDE ER 10 MEQ PO TBCR: 10.0000 meq | EXTENDED_RELEASE_TABLET | Freq: Two times a day (BID) | ORAL | 3 refills | Status: DC

## 2018-09-17 NOTE — Progress Notes (Signed)
klor con 10  

## 2018-09-17 NOTE — Progress Notes (Signed)
Vit d 

## 2018-10-14 ENCOUNTER — Ambulatory Visit (HOSPITAL_COMMUNITY)
Admission: RE | Admit: 2018-10-14 | Discharge: 2018-10-14 | Disposition: A | Discharge status: Home / Self Care | Payer: BLUE CROSS/BLUE SHIELD | Source: Ambulatory Visit | Attending: Family Medicine | Admitting: Family Medicine

## 2018-10-14 ENCOUNTER — Ambulatory Visit (HOSPITAL_COMMUNITY): Payer: BLUE CROSS/BLUE SHIELD

## 2018-10-14 DIAGNOSIS — R922 Inconclusive mammogram: Secondary | ICD-10-CM | POA: Diagnosis not present

## 2018-10-14 DIAGNOSIS — Z1231 Encounter for screening mammogram for malignant neoplasm of breast: Secondary | ICD-10-CM | POA: Insufficient documentation

## 2018-10-15 ENCOUNTER — Ambulatory Visit: Payer: BLUE CROSS/BLUE SHIELD | Admitting: Family Medicine

## 2018-11-01 ENCOUNTER — Other Ambulatory Visit: Payer: Self-pay | Admitting: Obstetrics and Gynecology

## 2018-11-05 NOTE — Telephone Encounter (Signed)
Refil metrogel x 3 refil for use after menses

## 2018-11-25 ENCOUNTER — Telehealth: Payer: Self-pay | Admitting: Family Medicine

## 2018-11-25 NOTE — Telephone Encounter (Signed)
Spoke with patient, advised her to call health department as they have access to the national registry and can better access immunization records to see what she has had and what she needs. She will call us back. Needs these immunizations for school

## 2018-11-25 NOTE — Telephone Encounter (Signed)
Please call the pt -you can leave a msg, she needs to know if she can get some immunizations. Cell phone  Hep  MMR 2 And a copy of the shot records on file

## 2018-12-12 ENCOUNTER — Ambulatory Visit (INDEPENDENT_AMBULATORY_CARE_PROVIDER_SITE_OTHER): Payer: BLUE CROSS/BLUE SHIELD | Admitting: Orthopaedic Surgery

## 2018-12-12 ENCOUNTER — Telehealth: Payer: Self-pay | Admitting: Family Medicine

## 2018-12-12 NOTE — Telephone Encounter (Signed)
Please give pt a call regarding some immunizations.

## 2018-12-12 NOTE — Telephone Encounter (Signed)
Returned patient's call. No answer. Left vm requesting call back.

## 2018-12-14 DIAGNOSIS — M7711 Lateral epicondylitis, right elbow: Secondary | ICD-10-CM | POA: Diagnosis not present

## 2018-12-15 ENCOUNTER — Telehealth: Payer: Self-pay | Admitting: *Deleted

## 2018-12-15 NOTE — Telephone Encounter (Signed)
See previous note

## 2018-12-15 NOTE — Telephone Encounter (Signed)
Left message requesting  call back.

## 2018-12-15 NOTE — Telephone Encounter (Signed)
Spoke with patient. She needs titers for school. She will discuss in further detail with Dr.Simpson at her appt on 12/18/2018. Printed off TD/TDAP and placed in brown folder for her to pick up today.

## 2018-12-15 NOTE — Telephone Encounter (Signed)
PT called returning your call said you had LVM also wanted to know about immunizations

## 2018-12-18 ENCOUNTER — Ambulatory Visit: Payer: BLUE CROSS/BLUE SHIELD | Admitting: Family Medicine

## 2018-12-19 ENCOUNTER — Encounter: Payer: Self-pay | Admitting: Family Medicine

## 2018-12-19 ENCOUNTER — Ambulatory Visit: Payer: BLUE CROSS/BLUE SHIELD | Admitting: Family Medicine

## 2018-12-19 VITALS — BP 140/90 | HR 97 | Resp 12 | Ht 67.0 in | Wt 205.0 lb

## 2018-12-19 DIAGNOSIS — Z0184 Encounter for antibody response examination: Secondary | ICD-10-CM

## 2018-12-19 DIAGNOSIS — E785 Hyperlipidemia, unspecified: Secondary | ICD-10-CM | POA: Diagnosis not present

## 2018-12-19 DIAGNOSIS — I10 Essential (primary) hypertension: Secondary | ICD-10-CM

## 2018-12-19 DIAGNOSIS — E669 Obesity, unspecified: Secondary | ICD-10-CM | POA: Diagnosis not present

## 2018-12-19 NOTE — Progress Notes (Signed)
Acute Office Visit  Subjective:    Patient ID: Marcia Hahn, female    DOB: December 08, 1975, 43 y.o.   MRN: [id number]  Chief Complaint  Patient presents with  . immunization titers  . Hypertension    HPI Mrs. Moshe Cipro is in today for a titer immunity status assessment for school, she is going back for nursing. She also would like her Blood pressure assessed. Was seen in Oct by Dr Moshe Cipro. Blood pressure medication was increased at that time. BP in office then was 160/105, today is 141/95. This is improved but still has room to be better, but she is concerned about it still being elevated and would like to discuss options. Has changed diet over the last month. Drinking more water, occasional soda. Does not eat a lot of pasta or breads. Does eat more sweets during cycle time. Denies eating a lot of red meat. Reports eating more chicken and pork chops. Has been trying to eat more veggies versus meat, and tries not to eat second portions.              Outpatient Medications Prior to Visit  Medication Sig Dispense Refill  . ergocalciferol (VITAMIN D2) 1.25 MG (50000 UT) capsule Take 1 capsule (50,000 Units total) by mouth once a week. One capsule once weekly 12 capsule 3  . Ferrous Sulfate (IRON) 325 (65 Fe) MG TABS One tablet three times daily 90 each 11  . metroNIDAZOLE (METROGEL) 0.75 % vaginal gel PLACE 1 APPLICATOR AT BEDTIME EVERY OTHER DAY X 2 AFTER MENSES 70 g 2  . potassium chloride (KLOR-CON 10) 10 MEQ tablet Take 1 tablet (10 mEq total) by mouth 2 (two) times daily. 60 tablet 3  . triamterene-hydrochlorothiazide (MAXZIDE) 75-50 MG tablet Take 1 tablet by mouth daily. 90 tablet 1   No facility-administered medications prior to visit.     Allergies  Allergen Reactions  . Tomato     Processed tomatoes in the can.     Review of Systems  Constitutional: Negative for chills, fever, malaise/fatigue and weight loss.  Eyes: Negative for blurred vision and double vision.    Respiratory: Negative for cough and shortness of breath.   Cardiovascular: Negative for chest pain, palpitations and leg swelling.  Musculoskeletal: Negative.   Neurological: Negative for dizziness, weakness and headaches.  Endo/Heme/Allergies: Negative for polydipsia.  Psychiatric/Behavioral: The patient is not nervous/anxious and does not have insomnia.   All other systems reviewed and are negative.      Objective:    Physical Exam  Constitutional: She is oriented to person, place, and time. She appears well-developed and well-nourished. She does not appear ill. No distress.  HENT:  Head: Normocephalic and atraumatic.  Eyes: Pupils are equal, round, and reactive to light. Conjunctivae are normal.  Cardiovascular: Normal rate, regular rhythm, normal heart sounds and normal pulses. PMI is not displaced.  Pulmonary/Chest: Effort normal and breath sounds normal.  Musculoskeletal: Normal range of motion.  Neurological: She is alert and oriented to person, place, and time.  Skin: Skin is warm and dry.  Psychiatric: She has a normal mood and affect. Her behavior is normal. Judgment and thought content normal.  Nursing note and vitals reviewed.   BP 140/90 (BP Location: Left Arm, Patient Position: Sitting, Cuff Size: Normal)   Pulse 97   Resp 12   Ht 5\' 7"  (1.702 m)   Wt 205 lb (93 kg)   LMP 12/16/2018   BMI 32.11 kg/m  Wt Readings from Last  3 Encounters:  12/19/18 205 lb (93 kg)  09/03/18 209 lb 1.9 oz (94.9 kg)  10/17/17 215 lb 9.6 oz (97.8 kg)    Today's Vitals   12/19/18 1104 12/19/18 1344  BP: (!) 141/95 140/90  Pulse: 97   Resp: 12   Weight: 205 lb (93 kg)   Height: 5\' 7"  (1.702 m)   PainSc: 0-No pain    Body mass index is 32.11 kg/m.     Assessment & Plan:    1. Immunity status testing Provider her with a copy of her immunizations and ordered her varicella titer.  - Varicella zoster antibody, IgG  2. Essential hypertension Blood pressure is better than  previous OV, but still needs some improvement to prevent organ damage. She is well aware of the impact that blood pressure can have, but she is not ready for another medication.  Educated about the need for her to loose weight-with lifestyle changes.   Advised to maintain current medication regimen and to focus on the DASH diet and increase in activity minimum of 30 minutes of walking. Discussed and encouraged this as a part of hypertension management. The importance of attaining a healthy weight is also discussed. Will return to office on 01/19/2019 for follow up on BP.    Perlie Mayo, NP

## 2018-12-19 NOTE — Patient Instructions (Signed)
    Thank you for coming into the office today. I appreciate the opportunity to provide you with the care for your  Immunization titer status and blood pressure.  Please go to [facility] and get your titer drawn. We will update you of results. Can give you a copy at anything you need it.   Monitor blood pressure if you have a cuff at home record time and date and bring these in at the next visit.  Work on lifestyle changes with diet and exercise. Walk Sun, Wed, Fri, Sat after 4 pm when you work, anything otherwise: 30 minutes for goal.   See DASH diet for food recommendations. Pinterest is also a good place for quick healthy meal ideas. Eat the rainbow, and well balanced diet. Drink water and avoid extra sugars.   When you come back we will reassess your BP if need be we might need to add another medication to help control it.    It was a pleasure to see you and I look forward to continuing to work together on your health and well-being. Please do not hesitate to call the office if you need care or have questions about your care.  Have a wonderful day and weekend.  With Gratitude,  Cherly Beach, DNP, AGNP-BC

## 2018-12-22 LAB — VARICELLA ZOSTER ANTIBODY, IGG: VARICELLA IGG: 504.5 {index}

## 2019-01-15 ENCOUNTER — Encounter: Payer: Self-pay | Admitting: Family Medicine

## 2019-01-19 ENCOUNTER — Ambulatory Visit: Payer: BLUE CROSS/BLUE SHIELD | Admitting: Family Medicine

## 2019-01-19 ENCOUNTER — Encounter: Payer: Self-pay | Admitting: Family Medicine

## 2019-01-19 ENCOUNTER — Telehealth: Payer: Self-pay

## 2019-01-19 VITALS — BP 160/96 | HR 84 | Resp 14 | Ht 67.0 in | Wt 203.0 lb

## 2019-01-19 DIAGNOSIS — E785 Hyperlipidemia, unspecified: Secondary | ICD-10-CM | POA: Diagnosis not present

## 2019-01-19 DIAGNOSIS — I10 Essential (primary) hypertension: Secondary | ICD-10-CM | POA: Diagnosis not present

## 2019-01-19 DIAGNOSIS — E559 Vitamin D deficiency, unspecified: Secondary | ICD-10-CM

## 2019-01-19 DIAGNOSIS — E8881 Metabolic syndrome: Secondary | ICD-10-CM | POA: Diagnosis not present

## 2019-01-19 DIAGNOSIS — Z020 Encounter for examination for admission to educational institution: Secondary | ICD-10-CM

## 2019-01-19 DIAGNOSIS — D539 Nutritional anemia, unspecified: Secondary | ICD-10-CM

## 2019-01-19 DIAGNOSIS — R7303 Prediabetes: Secondary | ICD-10-CM

## 2019-01-19 MED ORDER — AMLODIPINE BESYLATE 10 MG PO TABS: 10.0000 mg | ORAL_TABLET | Freq: Every day | ORAL | 3 refills | Status: DC

## 2019-01-19 NOTE — Assessment & Plan Note (Signed)
The increased risk of cardiovascular disease associated with this diagnosis, and the need to consistently work on lifestyle to change this is discussed. Following  a  heart healthy diet ,commitment to 30 minutes of exercise at least 5 days per week, as well as control of blood sugar and cholesterol , and achieving a healthy weight are all the areas to be addressed .  

## 2019-01-19 NOTE — Patient Instructions (Signed)
F/U in 2 months, call if you need me sooner  Additional BP medi cation is amlodipine 10 mg , continue maxzide as before  Hep B titer today  Non fasting hBA1C, chem 7 and EGFr, Vit D, CBC , iron and ferritin 3 to 5 days before next appt  It is important that you exercise regularly at least 30 minutes 5 times a week. If you develop chest pain, have severe difficulty breathing, or feel very tired, stop exercising immediately and seek medical attention   Think about what you will eat, plan ahead. Choose " clean, green, fresh or frozen" over canned, processed or packaged foods which are more sugary, salty and fatty. 70 to 75% of food eaten should be vegetables and fruit. Three meals at set times with snacks allowed between meals, but they must be fruit or vegetables. Aim to eat over a 12 hour period , example 7 am to 7 pm, and STOP after  your last meal of the day. Drink water,generally about 64 ounces per day, no other drink is as healthy. Fruit juice is best enjoyed in a healthy way, by EATING the fruit.

## 2019-01-19 NOTE — Assessment & Plan Note (Signed)
Patient educated about the importance of limiting  Carbohydrate intake , the need to commit to daily physical activity for a minimum of 30 minutes , and to commit weight loss. The fact that changes in all these areas will reduce or eliminate all together the development of diabetes is stressed.  Updated lab needed at/ before next visit.   Diabetic Labs Latest Ref Rng & Units 09/16/2018 06/11/2017 03/05/2015 01/25/2015 10/12/2013  HbA1c <5.7 % - 5.6 6.1(H) - 5.9(H)  Chol <200 mg/dL 143 163 144 - -  HDL >50 mg/dL 38(L) 39(L) 37(L) - -  Calc LDL mg/dL (calc) 82 103(H) 80 - -  Triglycerides <150 mg/dL 126 107 136 - -  Creatinine 0.50 - 1.10 mg/dL 0.91 0.81 1.00 0.83 -   BP/Weight 01/19/2019 12/19/2018 09/03/2018 10/17/2017 07/16/2017 06/11/2017 0/34/7425  Systolic BP 956 387 564 332 951 884 166  Diastolic BP 96 90 063 92 82 100 80  Wt. (Lbs) 203 205 209.12 215.6 - 216 209  BMI 31.79 32.11 32.75 35.6 - 33.83 32.73   No flowsheet data found.  ]

## 2019-01-19 NOTE — Assessment & Plan Note (Signed)
Uncontrolled , add amlodipine  DASH diet and commitment to daily physical activity for a minimum of 30 minutes discussed and encouraged, as a part of hypertension management. The importance of attaining a healthy weight is also discussed.  BP/Weight 01/19/2019 12/19/2018 09/03/2018 10/17/2017 07/16/2017 06/11/2017 01/23/9701  Systolic BP 637 858 850 277 412 878 676  Diastolic BP 96 90 720 92 82 100 80  Wt. (Lbs) 203 205 209.12 215.6 - 216 209  BMI 31.79 32.11 32.75 35.6 - 33.83 32.73

## 2019-01-19 NOTE — Telephone Encounter (Signed)
Labs ordered.

## 2019-01-19 NOTE — Progress Notes (Signed)
Marcia Hahn     MRN: [id number]      DOB: 07-Sep-1976   HPI Marcia Hahn is here for follow up and re-evaluation of chronic medical conditions, medication management and review of any available recent lab and radiology data.  Preventive health is updated, specifically  Cancer screening and Immunization.   Questions or concerns regarding consultations or procedures which the PT has had in the interim are  addressed. The PT denies any adverse reactions to current medications since the last visit.  Not exercising as often as she wants to, also will be following healthy food choice  ROS Denies recent fever or chills. Denies sinus pressure, nasal congestion, ear pain or sore throat. Denies chest congestion, productive cough or wheezing. Denies chest pains, palpitations and leg swelling Denies abdominal pain, nausea, vomiting,diarrhea or constipation.   Denies dysuria, frequency, hesitancy or incontinence. Denies joint pain, swelling and limitation in mobility. Denies headaches, seizures, numbness, or tingling. Denies depression, anxiety or insomnia. Denies skin break down or rash.   PE  BP (!) 160/96   Pulse 84   Resp 14   Ht 5\' 7"  (1.702 m)   Wt 203 lb (92.1 kg)   SpO2 98% Comment: room air  BMI 31.79 kg/m   Patient alert and oriented and in no cardiopulmonary distress.  HEENT: No facial asymmetry, EOMI,   oropharynx pink and moist.  Neck supple no JVD, no mass.  Chest: Clear to auscultation bilaterally.  CVS: S1, S2 no murmurs, no S3.Regular rate.  ABD: Soft non tender.   Ext: No edema  MS: Adequate ROM spine, shoulders, hips and knees.  Skin: Intact, no ulcerations or rash noted.  Psych: Good eye contact, normal affect. Memory intact not anxious or depressed appearing.  CNS: CN 2-12 intact, power,  normal throughout.no focal deficits noted.   Assessment & Plan  Essential hypertension Uncontrolled , add amlodipine  DASH diet and commitment to daily physical  activity for a minimum of 30 minutes discussed and encouraged, as a part of hypertension management. The importance of attaining a healthy weight is also discussed.  BP/Weight 01/19/2019 12/19/2018 09/03/2018 10/17/2017 07/16/2017 06/11/2017 0/86/5784  Systolic BP 696 295 284 132 440 102 725  Diastolic BP 96 90 366 92 82 100 80  Wt. (Lbs) 203 205 209.12 215.6 - 216 209  BMI 31.79 32.11 32.75 35.6 - 33.83 32.73       Dyslipidemia (high LDL; low HDL) Hyperlipidemia:Low fat diet discussed and encouraged.   Lipid Panel  Lab Results  Component Value Date   CHOL 143 09/16/2018   HDL 38 (L) 09/16/2018   LDLCALC 82 09/16/2018   TRIG 126 09/16/2018   CHOLHDL 3.8 09/16/2018       Prediabetes Patient educated about the importance of limiting  Carbohydrate intake , the need to commit to daily physical activity for a minimum of 30 minutes , and to commit weight loss. The fact that changes in all these areas will reduce or eliminate all together the development of diabetes is stressed.  Updated lab needed at/ before next visit.   Diabetic Labs Latest Ref Rng & Units 09/16/2018 06/11/2017 03/05/2015 01/25/2015 10/12/2013  HbA1c <5.7 % - 5.6 6.1(H) - 5.9(H)  Chol <200 mg/dL 143 163 144 - -  HDL >50 mg/dL 38(L) 39(L) 37(L) - -  Calc LDL mg/dL (calc) 82 103(H) 80 - -  Triglycerides <150 mg/dL 126 107 136 - -  Creatinine 0.50 - 1.10 mg/dL 0.91 0.81 1.00 0.83 -  BP/Weight 01/19/2019 12/19/2018 09/03/2018 10/17/2017 07/16/2017 06/11/2017 2/88/3374  Systolic BP 451 460 479 987 215 872 761  Diastolic BP 96 90 848 92 82 100 80  Wt. (Lbs) 203 205 209.12 215.6 - 216 209  BMI 31.79 32.11 32.75 35.6 - 33.83 32.73   No flowsheet data found.  ]  Metabolic syndrome X The increased risk of cardiovascular disease associated with this diagnosis, and the need to consistently work on lifestyle to change this is discussed. Following  a  heart healthy diet ,commitment to 30 minutes of exercise at least 5 days per  week, as well as control of blood sugar and cholesterol , and achieving a healthy weight are all the areas to be addressed .

## 2019-01-19 NOTE — Assessment & Plan Note (Signed)
Hyperlipidemia:Low fat diet discussed and encouraged.   Lipid Panel  Lab Results  Component Value Date   CHOL 143 09/16/2018   HDL 38 (L) 09/16/2018   LDLCALC 82 09/16/2018   TRIG 126 09/16/2018   CHOLHDL 3.8 09/16/2018

## 2019-03-05 ENCOUNTER — Other Ambulatory Visit: Payer: Self-pay | Admitting: Family Medicine

## 2019-03-16 ENCOUNTER — Telehealth: Payer: BLUE CROSS/BLUE SHIELD | Admitting: Physician Assistant

## 2019-03-16 ENCOUNTER — Encounter: Payer: Self-pay | Admitting: Physician Assistant

## 2019-03-16 DIAGNOSIS — R21 Rash and other nonspecific skin eruption: Secondary | ICD-10-CM

## 2019-03-16 DIAGNOSIS — W57XXXA Bitten or stung by nonvenomous insect and other nonvenomous arthropods, initial encounter: Secondary | ICD-10-CM

## 2019-03-16 MED ORDER — DOXYCYCLINE HYCLATE 100 MG PO TABS: 100.0000 mg | ORAL_TABLET | Freq: Two times a day (BID) | ORAL | 0 refills | Status: DC

## 2019-03-16 NOTE — Progress Notes (Signed)
E Visit for Rash  We are sorry that you are not feeling well. Here is how we plan to help!  You have a rash due to tick bite. I will prescribe Doxycycline 100 mg twice daily for 10 days.     HOME CARE:   Take cool showers and avoid direct sunlight.  Apply cool compress or wet dressings.  Take a bath in an oatmeal bath.  Sprinkle content of one Aveeno packet under running faucet with comfortably warm water.  Bathe for 15-20 minutes, 1-2 times daily.  Pat dry with a towel. Do not rub the rash.  Use hydrocortisone cream.  Take an antihistamine like Benadryl for widespread rashes that itch.  The adult dose of Benadryl is 25-50 mg by mouth 4 times daily.  Caution:  This type of medication may cause sleepiness.  Do not drink alcohol, drive, or operate dangerous machinery while taking antihistamines.  Do not take these medications if you have prostate enlargement.  Read package instructions thoroughly on all medications that you take.  GET HELP RIGHT AWAY IF:   Symptoms don't go away after treatment.  Severe itching that persists.  If you rash spreads or swells.  If you rash begins to smell.  If it blisters and opens or develops a yellow-brown crust.  You develop a fever.  You have a sore throat.  You become short of breath.  MAKE SURE YOU:  Understand these instructions. Will watch your condition. Will get help right away if you are not doing well or get worse.  Thank you for choosing an e-visit. Your e-visit answers were reviewed by a board certified advanced clinical practitioner to complete your personal care plan. Depending upon the condition, your plan could have included both over the counter or prescription medications. Please review your pharmacy choice. Be sure that the pharmacy you have chosen is open so that you can pick up your prescription now.  If there is a problem you may message your provider in [place] to have the prescription routed to another  pharmacy. Your safety is important to [place]. If you have drug allergies check your prescription carefully.  For the next 24 hours, you can use MyChart to ask questions about today's visit, request a non-urgent call back, or ask for a work or school excuse from your e-visit provider. You will get an email in the next two days asking about your experience. I hope that your e-visit has been valuable and will speed your recovery.     I have spent 7 min in completion and review of this note- Lacy Duverney [facility]

## 2019-03-23 ENCOUNTER — Ambulatory Visit: Payer: BLUE CROSS/BLUE SHIELD | Admitting: Family Medicine

## 2019-03-31 ENCOUNTER — Ambulatory Visit: Payer: BLUE CROSS/BLUE SHIELD | Admitting: Family Medicine

## 2019-04-01 DIAGNOSIS — D539 Nutritional anemia, unspecified: Secondary | ICD-10-CM | POA: Diagnosis not present

## 2019-04-01 DIAGNOSIS — E559 Vitamin D deficiency, unspecified: Secondary | ICD-10-CM | POA: Diagnosis not present

## 2019-04-01 DIAGNOSIS — E8881 Metabolic syndrome: Secondary | ICD-10-CM | POA: Diagnosis not present

## 2019-04-01 DIAGNOSIS — I10 Essential (primary) hypertension: Secondary | ICD-10-CM | POA: Diagnosis not present

## 2019-04-02 LAB — BASIC METABOLIC PANEL WITH GFR
BUN: 12 mg/dL (ref 7–25)
CHLORIDE: 105 mmol/L (ref 98–110)
CO2: 30 mmol/L (ref 20–32)
Calcium: 8.6 mg/dL (ref 8.6–10.2)
Creat: 0.89 mg/dL (ref 0.50–1.10)
GFR, EST AFRICAN AMERICAN: 92 mL/min/{1.73_m2} (ref >=60)
GFR, Est Non African American: 79 mL/min/{1.73_m2} (ref >=60)
Glucose, Bld: 91 mg/dL (ref 65–99)
POTASSIUM: 3.6 mmol/L (ref 3.5–5.3)
Sodium: 141 mmol/L (ref 135–146)

## 2019-04-02 LAB — CBC
HCT: 34.2 % — ABNORMAL LOW (ref 35.0–45.0)
Hemoglobin: 11.3 g/dL — ABNORMAL LOW (ref 11.7–15.5)
MCH: 29.7 pg (ref 27.0–33.0)
MCHC: 33 g/dL (ref 32.0–36.0)
MCV: 89.8 fL (ref 80.0–100.0)
MPV: 10.4 fL (ref 7.5–12.5)
Platelets: 332 10*3/uL (ref 140–400)
RBC: 3.81 10*6/uL (ref 3.80–5.10)
RDW: 13 % (ref 11.0–15.0)
WBC: 8.4 10*3/uL (ref 3.8–10.8)

## 2019-04-02 LAB — FERRITIN: Ferritin: 13 ng/mL — ABNORMAL LOW (ref 16–232)

## 2019-04-02 LAB — HEMOGLOBIN A1C
EAG (MMOL/L): 6.2 (calc)
HEMOGLOBIN A1C: 5.5 %{Hb} (ref <5.7)
MEAN PLASMA GLUCOSE: 111 (calc)

## 2019-04-02 LAB — VITAMIN D 25 HYDROXY (VIT D DEFICIENCY, FRACTURES): Vit D, 25-Hydroxy: 23 ng/mL — ABNORMAL LOW (ref 30–100)

## 2019-04-02 LAB — HEPATITIS B SURFACE ANTIBODY, QUANTITATIVE: Hepatitis B-Post: >1000 m[IU]/mL (ref >=10)

## 2019-04-02 LAB — IRON: IRON: 60 ug/dL (ref 40–190)

## 2019-04-15 ENCOUNTER — Ambulatory Visit: Payer: BLUE CROSS/BLUE SHIELD | Admitting: Family Medicine

## 2019-04-15 ENCOUNTER — Other Ambulatory Visit (HOSPITAL_COMMUNITY)
Admission: RE | Admit: 2019-04-15 | Discharge: 2019-04-15 | Disposition: A | Discharge status: Home / Self Care | Payer: BLUE CROSS/BLUE SHIELD | Source: Ambulatory Visit | Attending: Family Medicine | Admitting: Family Medicine

## 2019-04-15 ENCOUNTER — Encounter: Payer: Self-pay | Admitting: Family Medicine

## 2019-04-15 ENCOUNTER — Other Ambulatory Visit: Payer: Self-pay

## 2019-04-15 VITALS — BP 148/92 | HR 83 | Resp 12 | Ht 67.0 in | Wt 207.0 lb

## 2019-04-15 DIAGNOSIS — E785 Hyperlipidemia, unspecified: Secondary | ICD-10-CM

## 2019-04-15 DIAGNOSIS — I10 Essential (primary) hypertension: Secondary | ICD-10-CM | POA: Diagnosis not present

## 2019-04-15 DIAGNOSIS — E669 Obesity, unspecified: Secondary | ICD-10-CM | POA: Diagnosis not present

## 2019-04-15 DIAGNOSIS — Z01419 Encounter for gynecological examination (general) (routine) without abnormal findings: Secondary | ICD-10-CM | POA: Diagnosis not present

## 2019-04-15 DIAGNOSIS — Z124 Encounter for screening for malignant neoplasm of cervix: Secondary | ICD-10-CM | POA: Insufficient documentation

## 2019-04-15 MED ORDER — ERGOCALCIFEROL 1.25 MG (50000 UT) PO CAPS: 50000.0000 [IU] | ORAL_CAPSULE | ORAL | 2 refills | Status: DC

## 2019-04-15 MED ORDER — CLONIDINE HCL 0.1 MG PO TABS: ORAL_TABLET | ORAL | 4 refills | Status: DC

## 2019-04-15 NOTE — Patient Instructions (Signed)
F/u in 9 weeks, call if you need me sooner.  New aadditional medication for blood pressure, clonidine 0.1 mg one at bedtime, continue the other 2 meds  .  Pap today and follow up result  Nol later thn 1 week, I will send a message and call if you do not respond  Think about what you will eat, plan ahead. Choose " clean, green, fresh or frozen" over canned, processed or packaged foods which are more sugary, salty and fatty. 70 to 75% of food eaten should be vegetables and fruit. Three meals at set times with snacks allowed between meals, but they must be fruit or vegetables. Aim to eat over a 12 hour period , example 7 am to 7 pm, and STOP after  your last meal of the day. Drink water,generally about 64 ounces per day, no other drink is as healthy. Fruit juice is best enjoyed in a healthy way, by EATING the fruit.   It is important that you exercise regularly at least 30 minutes 5 times a week. If you develop chest pain, have severe difficulty breathing, or feel very tired, stop exercising immediately and seek medical attention   Thanks for choosing [facility], we consider it a privelige to serve you. Continue weekly vit D

## 2019-04-19 ENCOUNTER — Encounter: Payer: Self-pay | Admitting: Family Medicine

## 2019-04-19 DIAGNOSIS — Z01419 Encounter for gynecological examination (general) (routine) without abnormal findings: Secondary | ICD-10-CM | POA: Insufficient documentation

## 2019-04-19 NOTE — Assessment & Plan Note (Signed)
Abnormal pap in the past, repeat overdue. Gyne eam as documented and pap sent

## 2019-04-19 NOTE — Progress Notes (Signed)
Marcia Hahn     MRN: [id number]      DOB: 04-13-1976   HPI Marcia Hahn is here for follow up and re-evaluation of chronic medical conditions, medication management and review of any available recent lab and radiology data.  Preventive health is updated, specifically  Cancer screening , pap is overdue and last pap was abnormal , so ties is done urgently There are no specific complaints   ROS Denies recent fever or chills. Denies sinus pressure, nasal congestion, ear pain or sore throat. Denies chest congestion, productive cough or wheezing. Denies chest pains, palpitations and leg swelling Denies abdominal pain, nausea, vomiting,diarrhea or constipation.   Denies dysuria, frequency, hesitancy or incontinence. Denies joint pain, swelling and limitation in mobility. Denies headaches, seizures, numbness, or tingling. Denies depression, anxiety or insomnia. Denies skin break down or rash.   PE  BP (!) 148/92   Pulse 83   Resp 12   Ht 5\' 7"  (1.702 m)   Wt 207 lb 0.6 oz (93.9 kg)   SpO2 99%   BMI 32.43 kg/m   Patient alert and oriented and in no cardiopulmonary distress.  HEENT: No facial asymmetry, EOMI,   oropharynx pink and moist.  Neck supple no JVD, no mass.  Chest: Clear to auscultation bilaterally.  CVS: S1, S2 no murmurs, no S3.Regular rate.  ABD: Soft non tender.  GYne: External genitala normal female with normal distribution of hair and no ulcers  Or sores Uterus normal size, no cervical motion tenderness No adnexal masses or tenderness No prolapse noted, good pelvic support Cervix appears healthy, no erythema or discharge, IUD string noted  Ext: No edema  MS: Adequate ROM spine, shoulders, hips and knees.  Skin: Intact, no ulcerations or rash noted.  Psych: Good eye contact, normal affect. Memory intact not anxious or depressed appearing.  CNS: CN 2-12 intact, power,  normal throughout.no focal deficits noted.   Assessment & Plan  Essential  hypertension Uncontrolled, add clonidine at bedtime DASH diet and commitment to daily physical activity for a minimum of 30 minutes discussed and encouraged, as a part of hypertension management. The importance of attaining a healthy weight is also discussed.  BP/Weight 04/15/2019 01/19/2019 12/19/2018 09/03/2018 10/17/2017 07/16/2017 0/93/2355  Systolic BP 732 202 542 706 237 628 315  Diastolic BP 92 96 90 176 92 82 100  Wt. (Lbs) 207.04 203 205 209.12 215.6 - 216  BMI 32.43 31.79 32.11 32.75 35.6 - 33.83       Encounter for gynecological examination Abnormal pap in the past, repeat overdue. Gyne eam as documented and pap sent  Obesity (BMI 30.0-34.9) Deteriorated.  Patient re-educated about  the importance of commitment to a  minimum of 150 minutes of exercise per week as able.  The importance of healthy food choices with portion control discussed, as well as eating regularly and within a 12 hour window most days. The need to choose "clean , green" food 50 to 75% of the time is discussed, as well as to make water the primary drink and set a goal of 64 ounces water daily.  Encouraged to start a food diary,  and to consider  joining a support group. Sample diet sheets offered. Goals set by the patient for the next several months.   Weight /BMI 04/15/2019 01/19/2019 12/19/2018  WEIGHT 207 lb 0.6 oz 203 lb 205 lb  HEIGHT 5\' 7"  5\' 7"  5\' 7"   BMI 32.43 kg/m2 31.79 kg/m2 32.11 kg/m2  Dyslipidemia (high LDL; low HDL) Hyperlipidemia:Low fat diet discussed and encouraged.   Lipid Panel  Lab Results  Component Value Date   CHOL 143 09/16/2018   HDL 38 (L) 09/16/2018   LDLCALC 82 09/16/2018   TRIG 126 09/16/2018   CHOLHDL 3.8 09/16/2018   Updated lab needed at/ before next visit. Needs to commit to more regular exercise

## 2019-04-19 NOTE — Assessment & Plan Note (Signed)
Hyperlipidemia:Low fat diet discussed and encouraged.   Lipid Panel  Lab Results  Component Value Date   CHOL 143 09/16/2018   HDL 38 (L) 09/16/2018   LDLCALC 82 09/16/2018   TRIG 126 09/16/2018   CHOLHDL 3.8 09/16/2018   Updated lab needed at/ before next visit. Needs to commit to more regular exercise

## 2019-04-19 NOTE — Assessment & Plan Note (Signed)
Uncontrolled, add clonidine at bedtime DASH diet and commitment to daily physical activity for a minimum of 30 minutes discussed and encouraged, as a part of hypertension management. The importance of attaining a healthy weight is also discussed.  BP/Weight 04/15/2019 01/19/2019 12/19/2018 09/03/2018 10/17/2017 07/16/2017 03/09/7680  Systolic BP 157 262 035 597 416 384 536  Diastolic BP 92 96 90 468 92 82 100  Wt. (Lbs) 207.04 203 205 209.12 215.6 - 216  BMI 32.43 31.79 32.11 32.75 35.6 - 33.83

## 2019-04-19 NOTE — Assessment & Plan Note (Signed)
Deteriorated.  Patient re-educated about  the importance of commitment to a  minimum of 150 minutes of exercise per week as able.  The importance of healthy food choices with portion control discussed, as well as eating regularly and within a 12 hour window most days. The need to choose "clean , green" food 50 to 75% of the time is discussed, as well as to make water the primary drink and set a goal of 64 ounces water daily.  Encouraged to start a food diary,  and to consider  joining a support group. Sample diet sheets offered. Goals set by the patient for the next several months.   Weight /BMI 04/15/2019 01/19/2019 12/19/2018  WEIGHT 207 lb 0.6 oz 203 lb 205 lb  HEIGHT 5\' 7"  5\' 7"  5\' 7"   BMI 32.43 kg/m2 31.79 kg/m2 32.11 kg/m2

## 2019-04-21 ENCOUNTER — Encounter: Payer: Self-pay | Admitting: Family Medicine

## 2019-04-21 LAB — CYTOLOGY - PAP
Diagnosis: NEGATIVE
Diagnosis: REACTIVE
HPV: NOT DETECTED

## 2019-06-17 ENCOUNTER — Ambulatory Visit: Payer: BLUE CROSS/BLUE SHIELD | Admitting: Family Medicine

## 2019-08-04 ENCOUNTER — Ambulatory Visit (INDEPENDENT_AMBULATORY_CARE_PROVIDER_SITE_OTHER): Payer: BC Managed Care – PPO | Admitting: Family Medicine

## 2019-08-04 ENCOUNTER — Encounter: Payer: Self-pay | Admitting: Family Medicine

## 2019-08-04 ENCOUNTER — Other Ambulatory Visit: Payer: Self-pay

## 2019-08-04 VITALS — BP 138/86 | HR 88 | Temp 98.3°F | Resp 15 | Ht 67.0 in | Wt 211.0 lb

## 2019-08-04 DIAGNOSIS — E559 Vitamin D deficiency, unspecified: Secondary | ICD-10-CM

## 2019-08-04 DIAGNOSIS — E785 Hyperlipidemia, unspecified: Secondary | ICD-10-CM

## 2019-08-04 DIAGNOSIS — I10 Essential (primary) hypertension: Secondary | ICD-10-CM | POA: Diagnosis not present

## 2019-08-04 DIAGNOSIS — E669 Obesity, unspecified: Secondary | ICD-10-CM | POA: Diagnosis not present

## 2019-08-04 DIAGNOSIS — Z1231 Encounter for screening mammogram for malignant neoplasm of breast: Secondary | ICD-10-CM | POA: Diagnosis not present

## 2019-08-04 DIAGNOSIS — E66811 Obesity, class 1: Secondary | ICD-10-CM

## 2019-08-04 MED ORDER — HYDRALAZINE HCL 50 MG PO TABS: ORAL_TABLET | ORAL | 3 refills | Status: DC

## 2019-08-04 NOTE — Patient Instructions (Addendum)
F/U in office with MD, re eval blood pressure in early    December, call if you need me before  Please schedule mammogram at checkout   Stop amlodipine  Due to side effects  Blopod pressure still uncontroled New is hydralazine 50 mg one two times daily, 9am and 9 pm  Continue once daily triamterene 75/50 at 9 am Clonidine 0.1 mg one at 9pm  Pill boxes do work!  Fasting lipid cmp and EGFr, vit D 5 days before   Stop ibuprofen , bad for blood pressue, heart and klidneys, use tylenol instead  It is important that you exercise regularly at least 30 minutes 5 times a week. If you develop chest pain, have severe difficulty breathing, or feel very tired, stop exercising immediately and seek medical attention  Think about what you will eat, plan ahead. Choose " clean, green, fresh or frozen" over canned, processed or packaged foods which are more sugary, salty and fatty. 70 to 75% of food eaten should be vegetables and fruit. Three meals at set times with snacks allowed between meals, but they must be fruit or vegetables. Aim to eat over a 12 hour period , example 7 am to 7 pm, and STOP after  your last meal of the day. Drink water,generally about 64 ounces per day, no other drink is as healthy. Fruit juice is best enjoyed in a healthy way, by EATING the fruit.

## 2019-08-08 ENCOUNTER — Encounter: Payer: Self-pay | Admitting: Family Medicine

## 2019-08-08 NOTE — Assessment & Plan Note (Signed)
  Patient re-educated about  the importance of commitment to a  minimum of 150 minutes of exercise per week as able.  The importance of healthy food choices with portion control discussed, as well as eating regularly and within a 12 hour window most days. The need to choose "clean , green" food 50 to 75% of the time is discussed, as well as to make water the primary drink and set a goal of 64 ounces water daily.    Weight /BMI 08/04/2019 04/15/2019 01/19/2019  WEIGHT 211 lb 207 lb 0.6 oz 203 lb  HEIGHT 5\' 7"  5\' 7"  5\' 7"   BMI 33.05 kg/m2 32.43 kg/m2 31.79 kg/m2

## 2019-08-08 NOTE — Assessment & Plan Note (Signed)
Uncontrolled , stop amlodipine, new is hydralalazine in addition to maxzide and clonidine DASH diet and commitment to daily physical activity for a minimum of 30 minutes discussed and encouraged, as a part of hypertension management. The importance of attaining a healthy weight is also discussed.  BP/Weight 08/04/2019 04/15/2019 01/19/2019 12/19/2018 09/03/2018 [id number]  Systolic BP 0000000 123456 0000000 [id number] 0000000 [id number]  Diastolic BP 86 92 96 90 123456 92 82  Wt. (Lbs) 211 207.04 203 205 209.12 215.6 -  BMI 33.05 32.43 31.79 32.11 32.75 35.6 -

## 2019-08-08 NOTE — Assessment & Plan Note (Signed)
Hyperlipidemia:Low fat diet discussed and encouraged.   Lipid Panel  Lab Results  Component Value Date   CHOL 143 09/16/2018   HDL 38 (L) 09/16/2018   LDLCALC 82 09/16/2018   TRIG 126 09/16/2018   CHOLHDL 3.8 09/16/2018   Updated lab needed at/ before next visit. nbeeeds to commit to regular exercise

## 2019-08-08 NOTE — Progress Notes (Signed)
Marcia Hahn     MRN: [id number]      DOB: 05/30/1976   HPI [name] is here for follow up and re-evaluation of chronic medical conditions, medication management and review of any available recent lab and radiology data.  Preventive health is updated, specifically  Cancer screening and Immunization.   Questions or concerns regarding consultations or procedures which the PT has had in the interim are  addressed. The PT reporrts adverse s/e with amlodipine, increased headache frequency for which she has been using ibuprofen   ROS Denies recent fever or chills. Denies sinus pressure, nasal congestion, ear pain or sore throat. Denies chest congestion, productive cough or wheezing. Denies chest pains, palpitations and leg swelling Denies abdominal pain, nausea, vomiting,diarrhea or constipation.   Denies dysuria, frequency, hesitancy or incontinence. Denies joint pain, swelling and limitation in mobility. Denies , seizures, numbness, or tingling. Denies depression, anxiety or insomnia. Denies skin break down or rash.   PE  BP 138/86   Pulse 88   Temp 98.3 F (36.8 C) (Temporal)   Resp 15   Ht 5\' 7"  (1.702 m)   Wt 211 lb (95.7 kg)   SpO2 98%   BMI 33.05 kg/m   Patient alert and oriented and in no cardiopulmonary distress.  HEENT: No facial asymmetry, EOMI,   oropharynx pink and moist.  Neck supple no JVD, no mass.  Chest: Clear to auscultation bilaterally.  CVS: S1, S2 no murmurs, no S3.Regular rate.  ABD: Soft non tender.   Ext: No edema  MS: Adequate ROM spine, shoulders, hips and knees.  Skin: Intact, no ulcerations or rash noted.  Psych: Good eye contact, normal affect. Memory intact not anxious or depressed appearing.  CNS: CN 2-12 intact, power,  normal throughout.no focal deficits noted.   Assessment & Plan  Essential hypertension Uncontrolled , stop amlodipine, new is hydralalazine in addition to maxzide and clonidine DASH diet and commitment to  daily physical activity for a minimum of 30 minutes discussed and encouraged, as a part of hypertension management. The importance of attaining a healthy weight is also discussed.  BP/Weight 08/04/2019 04/15/2019 01/19/2019 12/19/2018 09/03/2018 [id number]  Systolic BP 0000000 123456 0000000 [id number] 0000000 [id number]  Diastolic BP 86 92 96 90 123456 92 82  Wt. (Lbs) 211 207.04 203 205 209.12 215.6 -  BMI 33.05 32.43 31.79 32.11 32.75 35.6 -       Obesity (BMI 30.0-34.9)  Patient re-educated about  the importance of commitment to a  minimum of 150 minutes of exercise per week as able.  The importance of healthy food choices with portion control discussed, as well as eating regularly and within a 12 hour window most days. The need to choose "clean , green" food 50 to 75% of the time is discussed, as well as to make water the primary drink and set a goal of 64 ounces water daily.    Weight /BMI 08/04/2019 04/15/2019 01/19/2019  WEIGHT 211 lb 207 lb 0.6 oz 203 lb  HEIGHT 5\' 7"  5\' 7"  5\' 7"   BMI 33.05 kg/m2 32.43 kg/m2 31.79 kg/m2      Dyslipidemia (high LDL; low HDL) Hyperlipidemia:Low fat diet discussed and encouraged.   Lipid Panel  Lab Results  Component Value Date   CHOL 143 09/16/2018   HDL 38 (L) 09/16/2018   LDLCALC 82 09/16/2018   TRIG 126 09/16/2018   CHOLHDL 3.8 09/16/2018   Updated lab needed at/ before next visit. nbeeeds to commit to  regular exercise

## 2019-08-15 ENCOUNTER — Other Ambulatory Visit: Payer: Self-pay | Admitting: Family Medicine

## 2019-09-01 ENCOUNTER — Encounter: Payer: Self-pay | Admitting: Family Medicine

## 2019-10-07 DIAGNOSIS — I10 Essential (primary) hypertension: Secondary | ICD-10-CM | POA: Diagnosis not present

## 2019-10-07 DIAGNOSIS — E559 Vitamin D deficiency, unspecified: Secondary | ICD-10-CM | POA: Diagnosis not present

## 2019-10-08 LAB — COMPLETE METABOLIC PANEL WITH GFR
AG Ratio: 1.6 (calc) (ref 1.0–2.5)
ALT: 19 U/L (ref 6–29)
AST: 21 U/L (ref 10–30)
Albumin: 4.1 g/dL (ref 3.6–5.1)
Alkaline phosphatase (APISO): 44 U/L (ref 31–125)
BUN/Creatinine Ratio: 17 (calc) (ref 6–22)
BUN: 20 mg/dL (ref 7–25)
CO2: 29 mmol/L (ref 20–32)
Calcium: 9 mg/dL (ref 8.6–10.2)
Chloride: 102 mmol/L (ref 98–110)
Creat: 1.18 mg/dL — ABNORMAL HIGH (ref 0.50–1.10)
GFR, Est African American: 65 mL/min/{1.73_m2} (ref >=60)
GFR, Est Non African American: 56 mL/min/{1.73_m2} — ABNORMAL LOW (ref >=60)
Globulin: 2.5 g/dL (calc) (ref 1.9–3.7)
Glucose, Bld: 88 mg/dL (ref 65–99)
Potassium: 3.4 mmol/L — ABNORMAL LOW (ref 3.5–5.3)
Sodium: 141 mmol/L (ref 135–146)
Total Bilirubin: 0.4 mg/dL (ref 0.2–1.2)
Total Protein: 6.6 g/dL (ref 6.1–8.1)

## 2019-10-08 LAB — VITAMIN D 25 HYDROXY (VIT D DEFICIENCY, FRACTURES): Vit D, 25-Hydroxy: 32 ng/mL (ref 30–100)

## 2019-10-08 LAB — LIPID PANEL
Cholesterol: 145 mg/dL (ref <200)
HDL: 34 mg/dL — ABNORMAL LOW (ref >=50)
LDL Cholesterol (Calc): 85 mg/dL (calc)
Non-HDL Cholesterol (Calc): 111 mg/dL (calc) (ref <130)
Total CHOL/HDL Ratio: 4.3 (calc) (ref <5.0)
Triglycerides: 165 mg/dL — ABNORMAL HIGH (ref <150)

## 2019-10-10 ENCOUNTER — Encounter: Payer: Self-pay | Admitting: Family Medicine

## 2019-10-13 ENCOUNTER — Other Ambulatory Visit: Payer: Self-pay

## 2019-10-13 ENCOUNTER — Ambulatory Visit: Payer: BC Managed Care – PPO | Admitting: Family Medicine

## 2019-10-13 VITALS — BP 134/88 | HR 87 | Temp 98.6°F | Resp 15 | Ht 67.0 in | Wt 210.0 lb

## 2019-10-13 DIAGNOSIS — Z23 Encounter for immunization: Secondary | ICD-10-CM | POA: Diagnosis not present

## 2019-10-13 DIAGNOSIS — E559 Vitamin D deficiency, unspecified: Secondary | ICD-10-CM | POA: Diagnosis not present

## 2019-10-13 DIAGNOSIS — I10 Essential (primary) hypertension: Secondary | ICD-10-CM | POA: Diagnosis not present

## 2019-10-13 DIAGNOSIS — Z8249 Family history of ischemic heart disease and other diseases of the circulatory system: Secondary | ICD-10-CM

## 2019-10-13 DIAGNOSIS — R7303 Prediabetes: Secondary | ICD-10-CM

## 2019-10-13 DIAGNOSIS — E785 Hyperlipidemia, unspecified: Secondary | ICD-10-CM

## 2019-10-13 DIAGNOSIS — R072 Precordial pain: Secondary | ICD-10-CM

## 2019-10-13 MED ORDER — FERROUS SULFATE 325 (65 FE) MG PO TABS: 325.0000 mg | ORAL_TABLET | Freq: Three times a day (TID) | ORAL | 1 refills | Status: DC

## 2019-10-13 NOTE — Patient Instructions (Addendum)
F/U in office with MD in 5 months call if you need me before  You are referred to Cardiology for evaluation due to malignant hypertension, new chest discomfort and f/h of early heart disease. Your EKG today looks normal   Please continue current medication  Flu vaccine today, and nurse visit first week in January for TdAP  Lipid, chem 7 and eGFR, fasting, CBc, TSH and vit D 1 week before next visit  List of potassium rich foods will be provided

## 2019-10-13 NOTE — Progress Notes (Signed)
Marcia Hahn     MRN: [id number]      DOB: [birth year]   HPI [name] Cipro is here for follow up and re-evaluation of chronic medical conditions, medication management and review of any available recent lab and radiology data.  Preventive health is updated, specifically  Cancer screening and Immunization.   C/o intermittent light headedness and new chest discomfort going up into her throat since starting the hydralazine she believes. Her father died of a heart attack at age 4, c/o exertional fatigue, c/o intermittent  chest pain with activity   ROS Denies recent fever or chills. Denies sinus pressure, nasal congestion, ear pain or sore throat. Denies chest congestion, productive cough or wheeze. Denies abdominal pain, nausea, vomiting,diarrhea or constipation.   Denies dysuria, frequency, hesitancy or incontinence. Denies joint pain, swelling and limitation in mobility. Denies headaches, seizures, numbness, or tingling. Denies depression, anxiety or insomnia. Denies skin break down or rash.   PE BP 134/88   Pulse 87   Temp 98.6 F (37 C) (Temporal)   Resp 15   Ht 5\' 7"  (1.702 m)   Wt 210 lb (95.3 kg)   SpO2 98%   BMI 32.89 kg/m    Patient alert and oriented and in no cardiopulmonary distress.  HEENT: No facial asymmetry, EOMI,     Neck supple .  Chest: Clear to auscultation bilaterally.No reproducible chest wall tenderness  CVS: S1, S2 no murmurs, no S3.Regular rate. EKG: nSR, no lVH, no ischemia  ABD: Soft non tender.   Ext: No edema  MS: Adequate ROM spine, shoulders, hips and knees.  Skin: Intact, no ulcerations or rash noted.  Psych: Good eye contact, normal affect. Memory intact not anxious or depressed appearing.  CNS: CN 2-12 intact, power,  normal throughout.no focal deficits noted.   Assessment & Plan  Chest pain New onset chest pain with know f/h of premature CAD, father died at age 73 due to MI. Office eKG shows no ischemia or LVH and nSR,  needs cardiology eval based on family history and her personal risk factors  Malignant hypertension Currently controlled , however concern re s/e of possibly hydralazine and inconsistently, will hold on any medication changes until cardiology asseses and also get their input   Dyslipidemia (high LDL; low HDL) Hyperlipidemia:Low fat diet discussed and encouraged.   Lipid Panel  Lab Results  Component Value Date   CHOL 145 10/07/2019   HDL 34 (L) 10/07/2019   LDLCALC 85 10/07/2019   TRIG 165 (H) 10/07/2019   CHOLHDL 4.3 10/07/2019   Needs to reduce dietary  fat and increase activity    Prediabetes Patient educated about the importance of limiting  Carbohydrate intake , the need to commit to daily physical activity for a minimum of 30 minutes , and to commit weight loss. The fact that changes in all these areas will reduce or eliminate all together the development of diabetes is stressed.   Diabetic Labs Latest Ref Rng & Units 10/07/2019 04/01/2019 09/16/2018 06/11/2017 03/05/2015  HbA1c <5.7 % of total Hgb - 5.5 - 5.6 6.1(H)  Chol <200 mg/dL 145 - 143 163 144  HDL > OR = 50 mg/dL 34(L) - 38(L) 39(L) 37(L)  Calc LDL mg/dL (calc) 85 - 82 103(H) 80  Triglycerides <150 mg/dL 165(H) - 126 107 136  Creatinine 0.50 - 1.10 mg/dL 1.18(H) 0.89 0.91 0.81 1.00   BP/Weight 10/13/2019 08/04/2019 04/15/2019 01/19/2019 12/19/2018 09/03/2018 [id number]  Systolic BP Q000111Q 0000000 123456 0000000 140 160 150  Diastolic BP 88 86 92 96 90 105 92  Wt. (Lbs) 210 211 207.04 203 205 209.12 215.6  BMI 32.89 33.05 32.43 31.79 32.11 32.75 35.6   No flowsheet data found.  Corrected when most recently checked

## 2019-10-13 NOTE — Assessment & Plan Note (Addendum)
New onset chest pain with know f/h of premature CAD, father died at age 43 due to MI. Office eKG shows no ischemia or LVH and nSR, needs cardiology eval based on family history and her personal risk factors

## 2019-10-13 NOTE — Assessment & Plan Note (Signed)
Currently controlled , however concern re s/e of possibly hydralazine and inconsistently, will hold on any medication changes until cardiology asseses and also get their input

## 2019-10-18 ENCOUNTER — Encounter: Payer: Self-pay | Admitting: Family Medicine

## 2019-10-18 NOTE — Assessment & Plan Note (Signed)
Hyperlipidemia:Low fat diet discussed and encouraged.   Lipid Panel  Lab Results  Component Value Date   CHOL 145 10/07/2019   HDL 34 (L) 10/07/2019   LDLCALC 85 10/07/2019   TRIG 165 (H) 10/07/2019   CHOLHDL 4.3 10/07/2019   Needs to reduce dietary  fat and increase activity

## 2019-10-18 NOTE — Assessment & Plan Note (Signed)
Patient educated about the importance of limiting  Carbohydrate intake , the need to commit to daily physical activity for a minimum of 30 minutes , and to commit weight loss. The fact that changes in all these areas will reduce or eliminate all together the development of diabetes is stressed.   Diabetic Labs Latest Ref Rng & Units 10/07/2019 04/01/2019 09/16/2018 06/11/2017 03/05/2015  HbA1c <5.7 % of total Hgb - 5.5 - 5.6 6.1(H)  Chol <200 mg/dL 145 - 143 163 144  HDL > OR = 50 mg/dL 34(L) - 38(L) 39(L) 37(L)  Calc LDL mg/dL (calc) 85 - 82 103(H) 80  Triglycerides <150 mg/dL 165(H) - 126 107 136  Creatinine 0.50 - 1.10 mg/dL 1.18(H) 0.89 0.91 0.81 1.00   BP/Weight 10/13/2019 08/04/2019 04/15/2019 01/19/2019 12/19/2018 09/03/2018 [id number]  Systolic BP Q000111Q 0000000 123456 0000000 [id number] 0000000 Q000111Q  Diastolic BP 88 86 92 96 90 105 92  Wt. (Lbs) 210 211 207.04 203 205 209.12 215.6  BMI 32.89 33.05 32.43 31.79 32.11 32.75 35.6   No flowsheet data found.  Corrected when most recently checked

## 2019-10-19 ENCOUNTER — Ambulatory Visit (HOSPITAL_COMMUNITY)
Admission: RE | Admit: 2019-10-19 | Discharge: 2019-10-19 | Disposition: A | Discharge status: Home / Self Care | Payer: BC Managed Care – PPO | Source: Ambulatory Visit | Attending: Family Medicine | Admitting: Family Medicine

## 2019-10-19 ENCOUNTER — Other Ambulatory Visit: Payer: Self-pay

## 2019-10-19 DIAGNOSIS — Z1231 Encounter for screening mammogram for malignant neoplasm of breast: Secondary | ICD-10-CM | POA: Insufficient documentation

## 2019-10-21 ENCOUNTER — Other Ambulatory Visit: Payer: Self-pay

## 2019-10-21 MED ORDER — POTASSIUM CHLORIDE ER 10 MEQ PO TBCR: 10.0000 meq | EXTENDED_RELEASE_TABLET | Freq: Two times a day (BID) | ORAL | 3 refills | Status: DC

## 2019-11-01 ENCOUNTER — Other Ambulatory Visit: Payer: Self-pay | Admitting: Family Medicine

## 2019-11-04 ENCOUNTER — Other Ambulatory Visit: Payer: Self-pay

## 2019-11-04 ENCOUNTER — Encounter: Payer: Self-pay | Admitting: Internal Medicine

## 2019-11-04 ENCOUNTER — Ambulatory Visit (INDEPENDENT_AMBULATORY_CARE_PROVIDER_SITE_OTHER): Payer: BC Managed Care – PPO | Admitting: Internal Medicine

## 2019-11-04 VITALS — BP 152/89 | HR 82 | Temp 97.5°F | Ht 66.0 in | Wt 212.0 lb

## 2019-11-04 DIAGNOSIS — I10 Essential (primary) hypertension: Secondary | ICD-10-CM | POA: Diagnosis not present

## 2019-11-04 MED ORDER — LOSARTAN POTASSIUM 50 MG PO TABS: 50.0000 mg | ORAL_TABLET | Freq: Every day | ORAL | 3 refills | Status: DC

## 2019-11-04 NOTE — Patient Instructions (Signed)
Medication Instructions:  Your physician has recommended you make the following change in your medication:  Stop Hydralazine  Start Cozaar 50 mg Daily   *If you need a refill on your cardiac medications before your next appointment, please call your pharmacy*  Lab Work: Your physician recommends that you return for lab work in: 10 days   If you have labs (blood work) drawn today and your tests are completely normal, you will receive your results only by: Marland Kitchen MyChart Message (if you have MyChart) OR . A paper copy in the mail If you have any lab test that is abnormal or we need to change your treatment, we will call you to review the results.  Testing/Procedures: NONE   Follow-Up: At [facility], you and your health needs are our priority.  As part of our continuing mission to provide you with exceptional heart care, we have created designated Provider Care Teams.  These Care Teams include your primary Cardiologist (physician) and Advanced Practice Providers (APPs -  Physician Assistants and Nurse Practitioners) who all work together to provide you with the care you need, when you need it.  Your next appointment:   1 month(s)  The format for your next appointment:   In Person  Provider:   You may see No primary care provider on file. or one of the following Advanced Practice Providers on your designated Care Team:    Bernerd Pho, PA-C   Ermalinda Barrios, [place]    Other Instructions Thank you for choosing [facility]!

## 2019-11-04 NOTE — Progress Notes (Signed)
Cardiology Office Note   Date:  11/04/2019   ID:  Marcia Hahn, DOB 09/23/1976, MRN [id number]  PCP:  Fayrene Helper, MD  Cardiologist:   Dorris Carnes, MD       History of Present Illness: Marcia Hahn is a 43 y.o. female with a history of hypertension and a family history of coronary artery disease.  She was last seen in cardiology clinic back in 2009 by [place].  At the time her LDL was 82.  The patient is followed by Tula Nakayama.  She was seen on December 1.  At that time complained of chest discomfort.  With family history were referred back to cardiology.  Last lipids 4 weeks ago LDL 85 HDL 34.  Triglycerides 165  Note carotid Dopplers were normal in 2013  The patient has been followed in the interval by Dr. Moshe Cipro.  Note recently her blood pressure medicines were changed earlier in the fall she was placed on amlodipine she had to stop this because of edema and headache.  She was then placed on hydralazine.  She thinks that is about the time her chest discomfort started.  Episodes will occur about 4 times per week she will feel like a lump in her throat little short of breath last for about 30 minutes.  Not associated with activity.  She does not walk regularly she dances some around the house without a problem.  Again no chest discomfort with activity.  She did not take her blood pressure medicines today.     Current Meds  Medication Sig  . cloNIDine (CATAPRES) 0.1 MG tablet TAKE 1 TABLET BY MOUTH EVERY DAY AT BEDTIME FOR HIGH BLOOD PRESSURE  . ergocalciferol (VITAMIN D2) 1.25 MG (50000 UT) capsule Take 1 capsule (50,000 Units total) by mouth once a week. One capsule once weekly  . ferrous sulfate (FEROSUL) 325 (65 FE) MG tablet Take 1 tablet (325 mg total) by mouth 3 (three) times daily.  . hydrALAZINE (APRESOLINE) 50 MG tablet Take one tablet by mouth two times daily at 9am and 9 pm  . potassium chloride (KLOR-CON 10) 10 MEQ tablet Take 1 tablet (10 mEq  total) by mouth 2 (two) times daily.  Marland Kitchen triamterene-hydrochlorothiazide (MAXZIDE) 75-50 MG tablet TAKE 1 TABLET BY MOUTH DAILY     Allergies:   Amlodipine and Tomato   Past Medical History:  Diagnosis Date  . Acne vulgaris   . Hypertension   . Mild obesity     Past Surgical History:  Procedure Laterality Date  . BREAST BIOPSY Left   . CESAREAN SECTION       Social History:  The patient  reports that she quit smoking about 6 years ago. Her smoking use included cigarettes. She has never used smokeless tobacco. She reports current alcohol use. She reports that she does not use drugs.   Family History:  The patient's family history includes Breast cancer in her cousin and maternal grandmother; Diabetes in her mother; GER disease in her mother; Heart attack (age of onset: 76) in her father; Heart disease in her father; Hypertension in her mother; Migraines in her sister; Stroke in her maternal aunt and maternal grandmother.    ROS:  Please see the history of present illness. All other systems are reviewed and  Negative to the above problem except as noted.    PHYSICAL EXAM: VS:  BP (!) 152/89   Pulse 82   Temp (!) 97.5 F (36.4 C)   Ht 5'  6" (1.676 m)   Wt 212 lb (96.2 kg)   SpO2 98%   BMI 34.22 kg/m   GEN: Obese 43 year old no acute distress.  In no acute distress  HEENT: normal  Neck: no JVD, carotid bruits,  Cardiac: RRR; no murmurs, rubs, or gallops,no edema  Respiratory:  clear to auscultation bilaterally, normal work of breathing GI: soft, nontender, nondistended, + BS  No hepatomegaly  MS: no deformity Moving all extremities   Skin: warm and dry, no rash Neuro:  Strength and sensation are intact Psych: euthymic mood, full affect   EKG:  EKG is not ordered today.  Sinus rhythm 76 bpm nonspecific ST changes (10/13/2019).   Lipid Panel    Component Value Date/Time   CHOL 145 10/07/2019 1021   TRIG 165 (H) 10/07/2019 1021   HDL 34 (L) 10/07/2019 1021   CHOLHDL  4.3 10/07/2019 1021   VLDL 21 06/11/2017 1008   LDLCALC 85 10/07/2019 1021      Wt Readings from Last 3 Encounters:  11/04/19 212 lb (96.2 kg)  10/13/19 210 lb (95.3 kg)  08/04/19 211 lb (95.7 kg)      ASSESSMENT AND PLAN:  1.  Chest discomfort atypical for cardiac.  Not associated with activity.  Timing goes along with hydralazine not sure what it represents.  I would stop the hydralazine now and try another agent.  Follow.  2 hypertension.  Keep on clonidine for now.  Keep on Maxide for now.  Add Cozaar 50 to regimen.  Check be met in 10 days to 2 weeks.  Follow-up in 1 month.  3.  Dyslipidemia.  Last lipid values not bad.  Continue to watch diet.  4.  Obesity.  Discussed exercise.  Would be good if she could increase.  Plan for follow-up in 1 month.  Current medicines are reviewed at length with the patient today.  The patient does not have concerns regarding medicines.  Signed, Dorris Carnes, MD  11/04/2019 2:10 PM    [facility] [place], [place], [place]  [address] Phone: [phone]; Fax: [phone]

## 2019-12-09 DIAGNOSIS — I10 Essential (primary) hypertension: Secondary | ICD-10-CM | POA: Diagnosis not present

## 2019-12-09 LAB — BASIC METABOLIC PANEL
BUN/Creatinine Ratio: 18 (calc) (ref 6–22)
BUN: 20 mg/dL (ref 7–25)
CO2: 25 mmol/L (ref 20–32)
Calcium: 9 mg/dL (ref 8.6–10.2)
Chloride: 104 mmol/L (ref 98–110)
Creat: 1.12 mg/dL — ABNORMAL HIGH (ref 0.50–1.10)
Glucose, Bld: 89 mg/dL (ref 65–139)
Potassium: 3.3 mmol/L — ABNORMAL LOW (ref 3.5–5.3)
Sodium: 139 mmol/L (ref 135–146)

## 2019-12-09 NOTE — Progress Notes (Signed)
Cardiology Office Note   Date:  12/11/2019   ID:  Marcia Hahn, DOB [birth year], MRN [id number]  PCP:  Fayrene Helper, MD  Cardiologist:   Dorris Carnes, MD   F/U of HTN    History of Present Illness: Marcia Hahn is a 44 y.o. female with a history of hypertension and a family history of coronary artery disease.  She was last seen in cardiology clinic back in 2009 by Mar Daring.  At the time her LDL was 82.  The patient is followed by Tula Nakayama.  She was seen on October 13 2019.  At that time complained of chest discomfort.  With family history were referred back to cardiology.  Last lipids 4 weeks ago LDL 85 HDL 34.  Triglycerides 165  Note carotid Dopplers were normal in 2013    Note she was on trila of amlodipine and she had to stop this because of edema and headache.  She was then placed on hydralazine.  THis has been stopped  SInce I saw her for the initial time in Dec 2020 I adjusted meds and added losartan She says she has been feeling good   No CP   Breathing is OK    Does not check BP at home regularly.     I saw the pt in Dec 2020 as initial cosult   Current Meds  Medication Sig  . cloNIDine (CATAPRES) 0.1 MG tablet TAKE 1 TABLET BY MOUTH EVERY DAY AT BEDTIME FOR HIGH BLOOD PRESSURE  . ergocalciferol (VITAMIN D2) 1.25 MG (50000 UT) capsule Take 1 capsule (50,000 Units total) by mouth once a week. One capsule once weekly  . ferrous sulfate (FEROSUL) 325 (65 FE) MG tablet Take 1 tablet (325 mg total) by mouth 3 (three) times daily.  Marland Kitchen losartan (COZAAR) 50 MG tablet Take 1 tablet (50 mg total) by mouth daily.  . potassium chloride (KLOR-CON 10) 10 MEQ tablet Take 1 tablet (10 mEq total) by mouth 2 (two) times daily.  Marland Kitchen triamterene-hydrochlorothiazide (MAXZIDE) 75-50 MG tablet TAKE 1 TABLET BY MOUTH DAILY     Allergies:   Amlodipine and Tomato   Past Medical History:  Diagnosis Date  . Acne vulgaris   . Hypertension   . Mild obesity     Past  Surgical History:  Procedure Laterality Date  . BREAST BIOPSY Left   . CESAREAN SECTION       Social History:  The patient  reports that she quit smoking about 7 years ago. Her smoking use included cigarettes. She has never used smokeless tobacco. She reports current alcohol use. She reports that she does not use drugs.   Family History:  The patient's family history includes Breast cancer in her cousin and maternal grandmother; Diabetes in her mother; GER disease in her mother; Heart attack (age of onset: 47) in her father; Heart disease in her father; Hypertension in her mother; Migraines in her sister; Stroke in her maternal aunt and maternal grandmother.    ROS:  Please see the history of present illness. All other systems are reviewed and  Negative to the above problem except as noted.    PHYSICAL EXAM: VS:  BP 131/85   Pulse 75   Temp 97.7 F (36.5 C) (Temporal)   Ht 5\' 6"  (1.676 m)   Wt 210 lb (95.3 kg)   SpO2 99%   BMI 33.89 kg/m   GEN: Obese 44 year old no acute distress.  In no acute distress  HEENT: normal  Neck: no JVD Cardiac: RRR; no murmurs, rubs, or gallops,no LE edema  Respiratory:  clear to auscultation bilaterally, normal work of breathing GI: soft, nontender, nondistended, + BS  No hepatomegaly  MS: no deformity Moving all extremities   Skin: warm and dry, no rash Neuro:  Strength and sensation are intact Psych: euthymic mood, full affect   E KG:  EKG is not ordered today.    Lipid Panel    Component Value Date/Time   CHOL 145 10/07/2019 1021   TRIG 165 (H) 10/07/2019 1021   HDL 34 (L) 10/07/2019 1021   CHOLHDL 4.3 10/07/2019 1021   VLDL 21 06/11/2017 1008   LDLCALC 85 10/07/2019 1021      Wt Readings from Last 3 Encounters:  12/11/19 210 lb (95.3 kg)  11/04/19 212 lb (96.2 kg)  10/13/19 210 lb (95.3 kg)      ASSESSMENT AND PLAN: 1.  HTN   Pt did not take meds today  Has not taken bp at home  Recomm:   With K being low I would:    Cut  maxzide to 1/2 of 37.5/25   Increase losartan to 100    Stop K    She is not taking clonidine any longer   Check BMET in 7 to 10 days  Use Mychart to send in readings in 3 wks  2  Chest discomfort  Pt denies any discomfort   3.  Dyslipidemia.  Last lipid values not bad.  Continue to watch diet.  4.  Obesity.  Stay active  Watch diet calorie intake     Plan for follow-up in 1 month.  Current medicines are reviewed at length with the patient today.  The patient does not have concerns regarding medicines.  Signed, Dorris Carnes, MD  12/11/2019 11:07 AM    [facility] [place], [place], [place]  [address] Phone: [phone]; Fax: [phone]

## 2019-12-11 ENCOUNTER — Encounter: Payer: Self-pay | Admitting: Internal Medicine

## 2019-12-11 ENCOUNTER — Other Ambulatory Visit: Payer: Self-pay

## 2019-12-11 ENCOUNTER — Ambulatory Visit (INDEPENDENT_AMBULATORY_CARE_PROVIDER_SITE_OTHER): Payer: BC Managed Care – PPO | Admitting: Internal Medicine

## 2019-12-11 VITALS — BP 131/85 | HR 75 | Temp 97.7°F | Ht 66.0 in | Wt 210.0 lb

## 2019-12-11 DIAGNOSIS — Z79899 Other long term (current) drug therapy: Secondary | ICD-10-CM

## 2019-12-11 MED ORDER — TRIAMTERENE-HCTZ 37.5-25 MG PO TABS: ORAL_TABLET | ORAL | 3 refills | Status: DC

## 2019-12-11 MED ORDER — LOSARTAN POTASSIUM 100 MG PO TABS: 100.0000 mg | ORAL_TABLET | Freq: Every day | ORAL | 3 refills | Status: DC

## 2019-12-11 NOTE — Patient Instructions (Signed)
Medication Instructions:    STOP Potassium  DECREASE Maxzide to 37.5/ 25 mg  TAKE 1/2 TABLET DAILY   INCREASE Losartan to 100 mg daily  *If you need a refill on your cardiac medications before your next appointment, please call your pharmacy*  Lab Work:  BMET in 7-10 days   If you have labs (blood work) drawn today and your tests are completely normal, you will receive your results only by: Marland Kitchen MyChart Message (if you have MyChart) OR . A paper copy in the mail If you have any lab test that is abnormal or we need to change your treatment, we will call you to review the results.  Testing/Procedures: None  Follow-Up: At [facility], you and your health needs are our priority.  As part of our continuing mission to provide you with exceptional heart care, we have created designated Provider Care Teams.  These Care Teams include your primary Cardiologist (physician) and Advanced Practice Providers (APPs -  Physician Assistants and Nurse Practitioners) who all work together to provide you with the care you need, when you need it.  Your next appointment:    Late summer with Dr.Ross  The format for your next appointment:   In Person  Provider:   Dorris Carnes, MD  Other Instructions Send a MyChart message to Dr.Ross with BP readings in 3 weeks

## 2019-12-14 ENCOUNTER — Telehealth: Payer: Self-pay

## 2019-12-14 DIAGNOSIS — Z79899 Other long term (current) drug therapy: Secondary | ICD-10-CM

## 2019-12-14 MED ORDER — SPIRONOLACTONE 25 MG PO TABS: 12.5000 mg | ORAL_TABLET | Freq: Every day | ORAL | 3 refills | Status: DC

## 2019-12-14 NOTE — Telephone Encounter (Signed)
-----   Message from Fay Records, MD sent at 12/10/2019  3:20 PM EST ----- Potassium remains a little low    I would like to adjust meds a bit   For now add sprironolactone 12.5 mg   Will help BP and should help normalize potassium   WIll try to consolidate meds in the next couple months Check BMET in 2 wks

## 2019-12-14 NOTE — Telephone Encounter (Signed)
I spoke with patient, she will start aldactone 12.5 mg daily.I will mail lab slip to her

## 2019-12-23 LAB — HM DIABETES EYE EXAM

## 2020-02-05 ENCOUNTER — Encounter: Payer: Self-pay | Admitting: Family Medicine

## 2020-02-07 ENCOUNTER — Other Ambulatory Visit: Payer: Self-pay | Admitting: Family Medicine

## 2020-02-07 DIAGNOSIS — H4710 Unspecified papilledema: Secondary | ICD-10-CM

## 2020-02-07 NOTE — Progress Notes (Signed)
Am opthal

## 2020-02-08 ENCOUNTER — Emergency Department (HOSPITAL_COMMUNITY): Payer: BC Managed Care – PPO

## 2020-02-08 ENCOUNTER — Encounter (HOSPITAL_COMMUNITY): Payer: Self-pay

## 2020-02-08 ENCOUNTER — Inpatient Hospital Stay (HOSPITAL_COMMUNITY)
Admission: EM | Admit: 2020-02-08 | Discharge: 2020-02-09 | DRG: 204 | Disposition: A | Discharge status: Home / Self Care | Payer: BC Managed Care – PPO | Attending: Family Medicine | Admitting: Family Medicine

## 2020-02-08 ENCOUNTER — Inpatient Hospital Stay (HOSPITAL_COMMUNITY): Payer: BC Managed Care – PPO

## 2020-02-08 ENCOUNTER — Other Ambulatory Visit: Payer: Self-pay

## 2020-02-08 DIAGNOSIS — E8881 Metabolic syndrome: Secondary | ICD-10-CM | POA: Diagnosis not present

## 2020-02-08 DIAGNOSIS — I10 Essential (primary) hypertension: Secondary | ICD-10-CM

## 2020-02-08 DIAGNOSIS — E669 Obesity, unspecified: Secondary | ICD-10-CM | POA: Diagnosis present

## 2020-02-08 DIAGNOSIS — Z91018 Allergy to other foods: Secondary | ICD-10-CM

## 2020-02-08 DIAGNOSIS — R0602 Shortness of breath: Secondary | ICD-10-CM | POA: Diagnosis not present

## 2020-02-08 DIAGNOSIS — R0789 Other chest pain: Secondary | ICD-10-CM

## 2020-02-08 DIAGNOSIS — D649 Anemia, unspecified: Secondary | ICD-10-CM | POA: Diagnosis not present

## 2020-02-08 DIAGNOSIS — Z87891 Personal history of nicotine dependence: Secondary | ICD-10-CM

## 2020-02-08 DIAGNOSIS — Z79899 Other long term (current) drug therapy: Secondary | ICD-10-CM

## 2020-02-08 DIAGNOSIS — Z8249 Family history of ischemic heart disease and other diseases of the circulatory system: Secondary | ICD-10-CM

## 2020-02-08 DIAGNOSIS — D72829 Elevated white blood cell count, unspecified: Secondary | ICD-10-CM | POA: Diagnosis present

## 2020-02-08 DIAGNOSIS — R079 Chest pain, unspecified: Secondary | ICD-10-CM | POA: Diagnosis not present

## 2020-02-08 DIAGNOSIS — Z888 Allergy status to other drugs, medicaments and biological substances status: Secondary | ICD-10-CM | POA: Diagnosis not present

## 2020-02-08 DIAGNOSIS — D72825 Bandemia: Secondary | ICD-10-CM | POA: Diagnosis not present

## 2020-02-08 DIAGNOSIS — Z20822 Contact with and (suspected) exposure to covid-19: Secondary | ICD-10-CM | POA: Diagnosis present

## 2020-02-08 HISTORY — DX: Essential (primary) hypertension: I10

## 2020-02-08 LAB — CBC
HCT: 29.2 % — ABNORMAL LOW (ref 36.0–46.0)
Hemoglobin: 9.3 g/dL — ABNORMAL LOW (ref 12.0–15.0)
MCH: 30.7 pg (ref 26.0–34.0)
MCHC: 31.8 g/dL (ref 30.0–36.0)
MCV: 96.4 fL (ref 80.0–100.0)
Platelets: 186 10*3/uL (ref 150–400)
RBC: 3.03 MIL/uL — ABNORMAL LOW (ref 3.87–5.11)
RDW: 16.8 % — ABNORMAL HIGH (ref 11.5–15.5)
WBC: 123.3 10*3/uL (ref 4.0–10.5)
nRBC: 1.2 % — ABNORMAL HIGH (ref 0.0–0.2)

## 2020-02-08 LAB — DIFFERENTIAL
Band Neutrophils: 14 %
Basophils Absolute: 1.2 10*3/uL — ABNORMAL HIGH (ref 0.0–0.1)
Basophils Relative: 1 %
Eosinophils Absolute: 3.7 10*3/uL — ABNORMAL HIGH (ref 0.0–0.5)
Eosinophils Relative: 3 %
Lymphocytes Relative: 2 %
Lymphs Abs: 2.5 10*3/uL (ref 0.7–4.0)
Metamyelocytes Relative: 10 %
Monocytes Absolute: 6.2 10*3/uL — ABNORMAL HIGH (ref 0.1–1.0)
Monocytes Relative: 5 %
Myelocytes: 9 %
Neutro Abs: 78.9 10*3/uL — ABNORMAL HIGH (ref 1.7–7.7)
Neutrophils Relative %: 50 %
Other: 6 %

## 2020-02-08 LAB — POC URINE PREG, ED: Preg Test, Ur: NEGATIVE

## 2020-02-08 LAB — BASIC METABOLIC PANEL
Anion gap: 9 (ref 5–15)
BUN: 15 mg/dL (ref 6–20)
CO2: 27 mmol/L (ref 22–32)
Calcium: 9.3 mg/dL (ref 8.9–10.3)
Chloride: 104 mmol/L (ref 98–111)
Creatinine, Ser: 1.04 mg/dL — ABNORMAL HIGH (ref 0.44–1.00)
GFR calc Af Amer: >60 mL/min (ref >60)
GFR calc non Af Amer: >60 mL/min (ref >60)
Glucose, Bld: 92 mg/dL (ref 70–99)
Potassium: 3.4 mmol/L — ABNORMAL LOW (ref 3.5–5.1)
Sodium: 140 mmol/L (ref 135–145)

## 2020-02-08 LAB — LACTATE DEHYDROGENASE: LDH: 903 U/L — ABNORMAL HIGH (ref 98–192)

## 2020-02-08 LAB — TROPONIN I (HIGH SENSITIVITY)
Troponin I (High Sensitivity): 3 ng/L (ref <18)
Troponin I (High Sensitivity): 2 ng/L (ref <18)

## 2020-02-08 LAB — GLUCOSE, CAPILLARY
Glucose-Capillary: 76 mg/dL (ref 70–99)
Glucose-Capillary: 91 mg/dL (ref 70–99)

## 2020-02-08 LAB — URIC ACID: Uric Acid, Serum: 9.7 mg/dL — ABNORMAL HIGH (ref 2.5–7.1)

## 2020-02-08 LAB — BRAIN NATRIURETIC PEPTIDE: B Natriuretic Peptide: 57 pg/mL (ref 0.0–100.0)

## 2020-02-08 LAB — SARS CORONAVIRUS 2 (TAT 6-24 HRS): SARS Coronavirus 2: NEGATIVE

## 2020-02-08 MED ORDER — SODIUM CHLORIDE 0.9% FLUSH: 3.0000 mL | INTRAVENOUS | Status: DC | PRN

## 2020-02-08 MED ORDER — FERROUS SULFATE 325 (65 FE) MG PO TABS
325.0000 mg | ORAL_TABLET | Freq: Two times a day (BID) | ORAL | Status: DC
  Administered 2020-02-08 – 2020-02-09 (×2): 325 mg via ORAL
  Filled 2020-02-08 (×2): qty 1

## 2020-02-08 MED ORDER — ALLOPURINOL 300 MG PO TABS
300.0000 mg | ORAL_TABLET | Freq: Every day | ORAL | Status: DC
  Administered 2020-02-08 – 2020-02-09 (×2): 300 mg via ORAL
  Filled 2020-02-08 (×2): qty 1

## 2020-02-08 MED ORDER — TRAZODONE HCL 50 MG PO TABS: 50.0000 mg | ORAL_TABLET | Freq: Every evening | ORAL | Status: DC | PRN

## 2020-02-08 MED ORDER — ALUM & MAG HYDROXIDE-SIMETH 200-200-20 MG/5ML PO SUSP
30.0000 mL | ORAL | Status: DC | PRN
  Administered 2020-02-08: 30 mL via ORAL
  Filled 2020-02-08: qty 30

## 2020-02-08 MED ORDER — ONDANSETRON HCL 4 MG PO TABS: 4.0000 mg | ORAL_TABLET | Freq: Four times a day (QID) | ORAL | Status: DC | PRN

## 2020-02-08 MED ORDER — TRAZODONE HCL 50 MG PO TABS
100.0000 mg | ORAL_TABLET | Freq: Every day | ORAL | Status: DC
  Filled 2020-02-08: qty 2

## 2020-02-08 MED ORDER — HEPARIN SODIUM (PORCINE) 5000 UNIT/ML IJ SOLN
5000.0000 [IU] | Freq: Three times a day (TID) | INTRAMUSCULAR | Status: DC
  Filled 2020-02-08: qty 1

## 2020-02-08 MED ORDER — POLYETHYLENE GLYCOL 3350 17 G PO PACK: 17.0000 g | PACK | Freq: Every day | ORAL | Status: DC | PRN

## 2020-02-08 MED ORDER — ACETAMINOPHEN 325 MG PO TABS: 650.0000 mg | ORAL_TABLET | Freq: Four times a day (QID) | ORAL | Status: DC | PRN

## 2020-02-08 MED ORDER — NITROGLYCERIN 0.4 MG SL SUBL
0.4000 mg | SUBLINGUAL_TABLET | SUBLINGUAL | Status: DC | PRN
  Administered 2020-02-08: 0.4 mg via SUBLINGUAL
  Filled 2020-02-08: qty 1

## 2020-02-08 MED ORDER — ASPIRIN EC 81 MG PO TBEC
81.0000 mg | DELAYED_RELEASE_TABLET | Freq: Every day | ORAL | Status: DC
  Administered 2020-02-08 – 2020-02-09 (×2): 81 mg via ORAL
  Filled 2020-02-08 (×2): qty 1

## 2020-02-08 MED ORDER — ASPIRIN 81 MG PO CHEW
324.0000 mg | CHEWABLE_TABLET | Freq: Once | ORAL | Status: AC
  Administered 2020-02-08: 324 mg via ORAL
  Filled 2020-02-08: qty 4

## 2020-02-08 MED ORDER — SODIUM CHLORIDE 0.9 % IV SOLN: INTRAVENOUS | Status: DC

## 2020-02-08 MED ORDER — HYDROXYZINE HCL 25 MG PO TABS: 25.0000 mg | ORAL_TABLET | Freq: Two times a day (BID) | ORAL | Status: DC

## 2020-02-08 MED ORDER — ACETAMINOPHEN 650 MG RE SUPP: 650.0000 mg | Freq: Four times a day (QID) | RECTAL | Status: DC | PRN

## 2020-02-08 MED ORDER — SODIUM CHLORIDE 0.9 % IV SOLN: 250.0000 mL | INTRAVENOUS | Status: DC | PRN

## 2020-02-08 MED ORDER — ONDANSETRON HCL 4 MG/2ML IJ SOLN: 4.0000 mg | Freq: Four times a day (QID) | INTRAMUSCULAR | Status: DC | PRN

## 2020-02-08 MED ORDER — ADULT MULTIVITAMIN W/MINERALS CH
1.0000 | ORAL_TABLET | Freq: Every day | ORAL | Status: DC
  Administered 2020-02-08 – 2020-02-09 (×2): 1 via ORAL
  Filled 2020-02-08 (×2): qty 1

## 2020-02-08 MED ORDER — SPIRONOLACTONE 12.5 MG HALF TABLET
12.5000 mg | ORAL_TABLET | Freq: Every day | ORAL | Status: DC
  Filled 2020-02-08 (×3): qty 1

## 2020-02-08 MED ORDER — LOSARTAN POTASSIUM 50 MG PO TABS
100.0000 mg | ORAL_TABLET | Freq: Every day | ORAL | Status: DC
  Administered 2020-02-08: 100 mg via ORAL
  Filled 2020-02-08: qty 2

## 2020-02-08 MED ORDER — SODIUM CHLORIDE 0.9 % IV BOLUS
1000.0000 mL | Freq: Once | INTRAVENOUS | Status: AC
  Administered 2020-02-08: 1000 mL via INTRAVENOUS

## 2020-02-08 MED ORDER — LABETALOL HCL 5 MG/ML IV SOLN: 10.0000 mg | INTRAVENOUS | Status: DC | PRN

## 2020-02-08 MED ORDER — IOHEXOL 350 MG/ML SOLN
100.0000 mL | Freq: Once | INTRAVENOUS | Status: AC | PRN
  Administered 2020-02-08: 100 mL via INTRAVENOUS

## 2020-02-08 MED ORDER — SODIUM CHLORIDE 0.9% FLUSH
3.0000 mL | Freq: Two times a day (BID) | INTRAVENOUS | Status: DC
  Administered 2020-02-08 – 2020-02-09 (×2): 3 mL via INTRAVENOUS

## 2020-02-08 NOTE — ED Triage Notes (Signed)
Pt reports chest pain in center of chest and sob since Saturday.  Reports at times chest pain radiates into left shoulder and neck.  Has been taking Gas X without relief.

## 2020-02-08 NOTE — ED Provider Notes (Signed)
[facility] EMERGENCY DEPARTMENT Provider Note   CSN: [id number] Arrival date & time: 02/08/20  [id number]     History Chief Complaint  Patient presents with  . Chest Pain    Marcia Hahn is a 44 y.o. female with a history of hypertension, prediabetes, dyslipidemia and family history of father with MI at age 31 presenting with a 2-day history of mid sternal chest pressure in association with shortness of breath.  The pain intermittently radiates to her left shoulder and neck.  Her symptoms are not pleuritic.  She has taken Gas-X without relief of her symptoms.  She denies n/v, palpitations but reports her symptoms sometimes makes her feel "hot". She denies fevers, cough, abdominal pain, dizziness, peripheral edema or leg pain.  She has noted the sob seems worse when supine.   HPI  HPI: A 44 year old patient with a history of hypertension, hypercholesterolemia and obesity presents for evaluation of chest pain. Initial onset of pain was more than 6 hours ago. The patient's chest pain is described as heaviness/pressure/tightness and is not worse with exertion. The patient's chest pain is middle- or left-sided, is not well-localized, is not sharp and does radiate to the arms/jaw/neck. The patient does not complain of nausea and denies diaphoresis. The patient has a family history of coronary artery disease in a first-degree relative with onset less than age 9. The patient has no history of stroke, has no history of peripheral artery disease, has not smoked in the past 90 days and denies any history of treated diabetes.   Past Medical History:  Diagnosis Date  . Acne vulgaris   . Hypertension   . Mild obesity     Patient Active Problem List   Diagnosis Date Noted  . Leukocytosis 02/08/2020  . Benign essential HTN 02/08/2020  . Acute on chronic anemia 02/08/2020  . Abnormal mammogram of right breast 09/03/2018  . IDA (iron deficiency anemia) 06/12/2017  . Dyslipidemia (high LDL; low HDL)  06/12/2017  . Metabolic syndrome X [id number]  . Menorrhagia with regular cycle 03/07/2015  . Osteochondral defect of ankle 12/11/2012  . Prediabetes 12/10/2012  . Vitamin D deficiency 11/17/2012  . Obesity (BMI 30.0-34.9) 08/14/2009  . Chest pain 09/03/2008  . Malignant hypertension 02/27/2008    Past Surgical History:  Procedure Laterality Date  . BREAST BIOPSY Left   . CESAREAN SECTION       OB History    Gravida  1   Para  1   Term      Preterm  1   AB      Living  2     SAB      TAB      Ectopic      Multiple  1   Live Births  2           Family History  Problem Relation Age of Onset  . GER disease Mother   . Diabetes Mother   . Hypertension Mother   . Heart attack Father 76  . Heart disease Father   . Migraines Sister   . Breast cancer Maternal Grandmother   . Stroke Maternal Grandmother   . Breast cancer Cousin   . Stroke Maternal Aunt     Social History   Tobacco Use  . Smoking status: Former Smoker    Types: Cigarettes    Quit date: 12/04/2012    Years since quitting: 7.1  . Smokeless tobacco: Never Used  Substance Use Topics  . Alcohol use:  Yes    Comment: occasional  . Drug use: No    Home Medications Prior to Admission medications   Medication Sig Start Date End Date Taking? Authorizing Provider  ergocalciferol (VITAMIN D2) 1.25 MG (50000 UT) capsule Take 1 capsule (50,000 Units total) by mouth once a week. One capsule once weekly 04/15/19  Yes Fayrene Helper, MD  ferrous sulfate (FEROSUL) 325 (65 FE) MG tablet Take 1 tablet (325 mg total) by mouth 3 (three) times daily. 10/13/19  Yes Fayrene Helper, MD  losartan (COZAAR) 100 MG tablet Take 1 tablet (100 mg total) by mouth daily. 12/11/19 03/10/20 Yes Fay Records, MD  spironolactone (ALDACTONE) 25 MG tablet Take 0.5 tablets (12.5 mg total) by mouth daily. 12/14/19 03/13/20 Yes Fay Records, MD  triamterene-hydrochlorothiazide (MAXZIDE-25) 37.5-25 MG tablet Take 1/2 tablet  daily Patient taking differently: Take 0.5 tablets by mouth daily. Take 1/2 tablet daily 12/11/19  Yes Fay Records, MD    Allergies    Amlodipine and Tomato  Review of Systems   Review of Systems  Constitutional: Negative for fever.  HENT: Negative for congestion.   Eyes: Negative.   Respiratory: Positive for shortness of breath. Negative for cough and chest tightness.   Cardiovascular: Positive for chest pain. Negative for palpitations and leg swelling.  Gastrointestinal: Negative for abdominal pain and nausea.  Genitourinary: Negative.   Musculoskeletal: Negative for arthralgias, joint swelling and neck pain.  Skin: Negative.  Negative for rash and wound.  Neurological: Negative for dizziness, weakness, light-headedness, numbness and headaches.  Psychiatric/Behavioral: Negative.     Physical Exam Updated Vital Signs BP 139/75   Pulse (!) 53   Temp 98 F (36.7 C) (Oral)   Resp 18   LMP 02/01/2020   SpO2 100%   Physical Exam Vitals and nursing note reviewed.  Constitutional:      Appearance: She is well-developed.  HENT:     Head: Normocephalic and atraumatic.  Eyes:     Conjunctiva/sclera: Conjunctivae normal.  Cardiovascular:     Rate and Rhythm: Normal rate and regular rhythm.     Heart sounds: Normal heart sounds.  Pulmonary:     Effort: Pulmonary effort is normal.     Breath sounds: Normal breath sounds. No decreased breath sounds, wheezing, rhonchi or rales.  Abdominal:     General: Bowel sounds are normal.     Palpations: Abdomen is soft.     Tenderness: There is no abdominal tenderness.  Musculoskeletal:        General: Normal range of motion.     Cervical back: Normal range of motion.     Right lower leg: No tenderness. No edema.     Left lower leg: No tenderness. No edema.  Skin:    General: Skin is warm and dry.  Neurological:     Mental Status: She is alert.     ED Results / Procedures / Treatments   Labs (all labs ordered are listed, but  only abnormal results are displayed) Labs Reviewed  BASIC METABOLIC PANEL - Abnormal; Notable for the following components:      Result Value   Potassium 3.4 (*)    Creatinine, Ser 1.04 (*)    All other components within normal limits  CBC - Abnormal; Notable for the following components:   WBC 123.3 (*)    RBC 3.03 (*)    Hemoglobin 9.3 (*)    HCT 29.2 (*)    RDW 16.8 (*)    nRBC  1.2 (*)    All other components within normal limits  DIFFERENTIAL - Abnormal; Notable for the following components:   Neutro Abs 78.9 (*)    Monocytes Absolute 6.2 (*)    Eosinophils Absolute 3.7 (*)    Basophils Absolute 1.2 (*)    All other components within normal limits  SARS CORONAVIRUS 2 (TAT 6-24 HRS)  BRAIN NATRIURETIC PEPTIDE  PATHOLOGIST SMEAR REVIEW  LACTATE DEHYDROGENASE  URIC ACID  BCR-ABL1 KINASE DOMAIN MUTATION ANALYSIS  POC URINE PREG, ED  CBG MONITORING, ED  TROPONIN I (HIGH SENSITIVITY)  TROPONIN I (HIGH SENSITIVITY)    EKG EKG Interpretation  Date/Time:  Monday February 08 2020 09:18:47 EDT Ventricular Rate:  64 PR Interval:    QRS Duration: 89 QT Interval:  425 QTC Calculation: 439 R Axis:   62 Text Interpretation: Sinus arrhythmia No STEMI Confirmed by Nanda Quinton [phone]) on 02/08/2020 9:37:18 AM   Radiology DG Chest Portable 1 View  Result Date: 02/08/2020 CLINICAL DATA:  Chest pain EXAM: PORTABLE CHEST 1 VIEW COMPARISON:  January 25, 2015 FINDINGS: The lungs are clear. Heart is upper normal in size with pulmonary vascularity normal. No adenopathy. No pneumothorax. No bone lesions. IMPRESSION: Lungs clear.  Heart upper normal in size. Electronically Signed   By: Lowella Grip III M.D.   On: 02/08/2020 09:59    Procedures Procedures (including critical care time)  Medications Ordered in ED Medications  nitroGLYCERIN (NITROSTAT) SL tablet 0.4 mg (0.4 mg Sublingual Given 02/08/20 1000)  aspirin chewable tablet 324 mg (324 mg Oral Given 02/08/20 1000)  sodium  chloride 0.9 % bolus 1,000 mL (1,000 mLs Intravenous New Bag/Given 02/08/20 1321)    ED Course  I have reviewed the triage vital signs and the nursing notes.  Pertinent labs & imaging results that were available during my care of the patient were reviewed by me and considered in my medical decision making (see chart for details).    MDM Rules/Calculators/A&P HEAR Score: 3                    Pt with a 2 day history of chest pressure with sob, normal ekg, bnp and troponin.  Extremely elevated wbc count at 123  with differential of elevation in all wbc lines except lymphocytes.  Path smear pending.  Ekg reassuring with initial troponin normal, pending delta troponin, ACS unlikely.    Discussed pt and results with Dr Delton Coombes who recommended admission for further evaluation.  Recommended hydration which may help with wbc load, since no blasts on her differential, would not consider leukopheresis at this time.  Labs most consistent with CML.   Will consult with hospitalist to admit pt.    Pt is chest pain and sob free at this time.  Sx not suggestive of PE with normal VS, no pleuritic component to her sx.       Final Clinical Impression(s) / ED Diagnoses Final diagnoses:  Leukocytosis, unspecified type  Other chest pain  SOB (shortness of breath)    Rx / DC Orders ED Discharge Orders    None       Landis Martins 02/08/20 [place], Wonda Olds, MD 02/08/20 [phone]

## 2020-02-08 NOTE — ED Notes (Signed)
ED TO INPATIENT HANDOFF REPORT  ED Nurse Name and Phone #: [phone]  S Name/Age/Gender Marcia Hahn 44 y.o. female Room/Bed: APA02/APA02  Code Status   Code Status: Not on file  Home/SNF/Other Home Patient oriented to: self, place, time and situation Is this baseline? Yes   Triage Complete: Triage complete  Chief Complaint Leukocytosis [D72.829]  Triage Note Pt reports chest pain in center of chest and sob since Saturday.  Reports at times chest pain radiates into left shoulder and neck.  Has been taking Gas X without relief.    Allergies Allergies  Allergen Reactions  . Amlodipine     Headache   . Tomato     Processed tomatoes in the can.     Level of Care/Admitting Diagnosis ED Disposition    ED Disposition Condition [facility] Area: [facility] [id number]  Level of Care: Med-Surg [16]  Covid Evaluation: Asymptomatic Screening Protocol (No Symptoms)  Diagnosis: Leukocytosis [200030]  Admitting Physician: Morrison Old  Attending Physician: Morrison Old  Estimated length of stay: 3 - 4 days  Certification:: I certify this patient will need inpatient services for at least 2 midnights  Bed request comments: Leukocytosis       B Medical/Surgery History Past Medical History:  Diagnosis Date  . Acne vulgaris   . Hypertension   . Mild obesity    Past Surgical History:  Procedure Laterality Date  . BREAST BIOPSY Left   . CESAREAN SECTION       A IV Location/Drains/Wounds Patient Lines/Drains/Airways Status   Active Line/Drains/Airways    Name:   Placement date:   Placement time:   Site:   Days:   Peripheral IV 02/08/20 Right Antecubital   02/08/20    1036    Antecubital   less than 1          Intake/Output Last 24 hours No intake or output data in the 24 hours ending 02/08/20 1347  Labs/Imaging Results for orders placed or performed during the hospital encounter of 02/08/20 (from the past 48  hour(s))  Basic metabolic panel     Status: Abnormal   Collection Time: 02/08/20 10:02 AM  Result Value Ref Range   Sodium 140 135 - 145 mmol/L   Potassium 3.4 (L) 3.5 - 5.1 mmol/L   Chloride 104 98 - 111 mmol/L   CO2 27 22 - 32 mmol/L   Glucose, Bld 92 70 - 99 mg/dL    Comment: Glucose reference range applies only to samples taken after fasting for at least 8 hours.   BUN 15 6 - 20 mg/dL   Creatinine, Ser 1.04 (H) 0.44 - 1.00 mg/dL   Calcium 9.3 8.9 - 10.3 mg/dL   GFR calc non Af Amer >60 >60 mL/min   GFR calc Af Amer >60 >60 mL/min   Anion gap 9 5 - 15    Comment: Performed at [facility], [address] [place], [place] [address]  Troponin I (High Sensitivity)     Status: None   Collection Time: 02/08/20 10:02 AM  Result Value Ref Range   Troponin I (High Sensitivity) 3 <18 ng/L    Comment: (NOTE) Elevated high sensitivity troponin I (hsTnI) values and significant  changes across serial measurements may suggest ACS but many other  chronic and acute conditions are known to elevate hsTnI results.  Refer to the "Links" section for chest pain algorithms and additional  guidance. Performed at [facility], [place]  [address] [place], [place] [address]   CBC     Status: Abnormal   Collection Time: 02/08/20 10:02 AM  Result Value Ref Range   WBC 123.3 (HH) 4.0 - 10.5 K/uL    Comment: WHITE COUNT CONFIRMED ON SMEAR THIS CRITICAL RESULT HAS VERIFIED AND BEEN CALLED TO WHITE,M BY BOBBIE MATTHEWS ON 03 29 2021 AT 1029, AND HAS BEEN READ BACK.  PENDING PATH REVIEW CORRECTED ON 03/29 AT 1031: PREVIOUSLY REPORTED AS 123.3 WHITE COUNT CONFIRMED ON SMEAR THIS CRITICAL RESULT HAS VERIFIED AND BEEN CALLED TO WHITE,M BY BOBBIE MATTHEWS ON 03 29 2021 AT 1029, AND HAS BEEN READ BACK.     RBC 3.03 (L) 3.87 - 5.11 MIL/uL   Hemoglobin 9.3 (L) 12.0 - 15.0 g/dL   HCT 29.2 (L) 36.0 - 46.0 %   MCV 96.4 80.0 - 100.0 fL   MCH 30.7 26.0 - 34.0 pg   MCHC 31.8 30.0 - 36.0 g/dL   RDW 16.8 (H) 11.5 - 15.5 %    Platelets 186 150 - 400 K/uL   nRBC 1.2 (H) 0.0 - 0.2 %    Comment: Performed at [facility], [address], [place], [place] [address]  Brain natriuretic peptide     Status: None   Collection Time: 02/08/20 10:02 AM  Result Value Ref Range   B Natriuretic Peptide 57.0 0.0 - 100.0 pg/mL    Comment: Performed at [facility], [address] [place], [place] [address]  Differential     Status: Abnormal   Collection Time: 02/08/20 10:02 AM  Result Value Ref Range   Neutrophils Relative % 50 %   Neutro Abs 78.9 (H) 1.7 - 7.7 K/uL   Band Neutrophils 14 %   Lymphocytes Relative 2 %   Lymphs Abs 2.5 0.7 - 4.0 K/uL   Monocytes Relative 5 %   Monocytes Absolute 6.2 (H) 0.1 - 1.0 K/uL   Eosinophils Relative 3 %   Eosinophils Absolute 3.7 (H) 0.0 - 0.5 K/uL   Basophils Relative 1 %   Basophils Absolute 1.2 (H) 0.0 - 0.1 K/uL   WBC Morphology PENDING PATH REVIEW     Comment: MODERATE LEFT SHIFT (>5% METAS AND MYELOS,OCC PRO NOTED)   RBC Morphology RARE NUCLEATED RBCS PRESENT    Other 6 %   Metamyelocytes Relative 10 %   Myelocytes 9 %    Comment: Performed at [facility], [address], [place], [place] [address]  Troponin I (High Sensitivity)     Status: None   Collection Time: 02/08/20 12:09 PM  Result Value Ref Range   Troponin I (High Sensitivity) 2 <18 ng/L    Comment: (NOTE) Elevated high sensitivity troponin I (hsTnI) values and significant  changes across serial measurements may suggest ACS but many other  chronic and acute conditions are known to elevate hsTnI results.  Refer to the "Links" section for chest pain algorithms and additional  guidance. Performed at [facility], [address], [place], [place] [address]   POC urine preg, ED     Status: None   Collection Time: 02/08/20 12:53 PM  Result Value Ref Range   Preg Test, Ur NEGATIVE NEGATIVE    Comment:        THE SENSITIVITY OF THIS METHODOLOGY IS >24 mIU/mL    DG Chest Portable 1 View  Result Date:  02/08/2020 CLINICAL DATA:  Chest pain EXAM: PORTABLE CHEST 1 VIEW COMPARISON:  January 25, 2015 FINDINGS: The lungs are clear. Heart is upper normal in size  with pulmonary vascularity normal. No adenopathy. No pneumothorax. No bone lesions. IMPRESSION: Lungs clear.  Heart upper normal in size. Electronically Signed   By: Lowella Grip III M.D.   On: 02/08/2020 09:59    Pending Labs Unresulted Labs (From admission, onward)    Start     Ordered   02/08/20 1336  FISH HES Leukemia,4q12 REA  Once,   STAT     02/08/20 1335   02/08/20 1322  SARS CORONAVIRUS 2 (TAT 6-24 HRS) Nasopharyngeal Nasopharyngeal Swab  (Tier 3 (TAT 6-24 hrs))  Once,   STAT    Question Answer Comment  Is this test for diagnosis or screening Screening   Symptomatic for COVID-19 as defined by CDC No   Hospitalized for COVID-19 No   Admitted to ICU for COVID-19 No   Previously tested for COVID-19 No   Resident in a congregate (group) care setting No   Employed in healthcare setting No   Pregnant No      02/08/20 1321   02/08/20 1309  BCR-ABL1 Kinase domain mutation analysis  Once,   R     02/08/20 1309   02/08/20 1308  Lactate dehydrogenase  ONCE - STAT,   STAT     02/08/20 1309   02/08/20 1308  Uric acid  ONCE - STAT,   STAT     02/08/20 1309   02/08/20 1002  Pathologist smear review  Once,   R     02/08/20 1002   Signed and Held  HIV Antibody (routine testing w rflx)  (HIV Antibody (Routine testing w reflex) panel)  Once,   R     Signed and Held   Signed and Held  Basic metabolic panel  Tomorrow morning,   R     Signed and Held   Signed and Held  CBC  Tomorrow morning,   R     Signed and Held          Vitals/Pain Today's Vitals   02/08/20 1142 02/08/20 1200 02/08/20 1230 02/08/20 1236  BP:  (!) 149/96 139/75   Pulse:  61 (!) 53   Resp:  20 18   Temp:      TempSrc:      SpO2:  100% 100%   PainSc: 2    2     Isolation Precautions Airborne and Contact precautions  Medications Medications   nitroGLYCERIN (NITROSTAT) SL tablet 0.4 mg (0.4 mg Sublingual Given 02/08/20 1000)  aspirin chewable tablet 324 mg (324 mg Oral Given 02/08/20 1000)  sodium chloride 0.9 % bolus 1,000 mL (1,000 mLs Intravenous New Bag/Given 02/08/20 1321)    Mobility walks Low fall risk   Focused Assessments    R Recommendations: See Admitting Provider Note  Report given to:   Additional Notes:

## 2020-02-08 NOTE — H&P (Signed)
Patient Demographics:    Marcia Hahn, is a 44 y.o. female  MRN: [id number]   DOB - [birth year]  Admit Date - 02/08/2020  Outpatient Primary MD for the patient is Fayrene Helper, MD   Assessment & Plan:    Principal Problem:   Leukocytosis---??? CML Active Problems:   Metabolic syndrome X   Benign essential HTN   Acute on chronic anemia   SOB (shortness of breath)    1) severe leukocytosis----possible CML, WBC 123K, WBC was 8.4 on 04/01/2019 -Differential showed 50% neutrophils, 2% lymphocytes, 5% monocytes, 3% eosinophils, 1% basophil.  9% myelocytes-10% metamyelocytes were seen.  Rare nucleated RBCs were seen No fever  Or chills -CTA chest without acute findings -BCR/ABL pending,  -elevated LDH at 903 and uric acid at 9.7 noted--allopurinol started concerns about tumor lysis -Discussed with Dr. Delton Coombes -Patient may need bone marrow aspiration for definitive diagnosis -monitor for signs of hyperleukocytosis.  2)HTN--hold losartan and Aldactone due to contrast study  3) acute on chronic anemia----patient hemoglobin usually around 11 hemoglobin currently 9.3 -Hematology consult appreciated -Check stool occult blood  With History of - Reviewed by me  Past Medical History:  Diagnosis Date  . Acne vulgaris   . Hypertension   . Mild obesity       Past Surgical History:  Procedure Laterality Date  . BREAST BIOPSY Left   . CESAREAN SECTION        Chief Complaint  Patient presents with  . Chest Pain      HPI:    Marcia Hahn  is a 44 y.o. female with past medical history relevant for this metabolic syndrome, chronic anemia, and hypertension who presents with chest discomfort and dyspnea and is found to have a white count of 123K Hemoglobin is 9.3 and platelet count was 186.  WBC  Differential showed 50% neutrophils, 2% lymphocytes, 5% monocytes, 3% eosinophils, 1% basophil.  9% myelocytes-10% metamyelocytes were seen.  Rare nucleated RBCs were seen.  Her previous CBC on 04/01/2019 was 8.4 with hemoglobin of 11.3.  --Discussed with oncologist--- work-up in progress --CTA chest without acute findings -Hydrate,   Per oncologist admit and monitor for signs of hyperleukocytosis.  No fever  Or chills   No Nausea, Vomiting or Diarrhea    Review of systems:    In addition to the HPI above,   A full Review of  Systems was done, all other systems reviewed are negative except as noted above in HPI , .    Social History:  Reviewed by me    Social History   Tobacco Use  . Smoking status: Former Smoker    Types: Cigarettes    Quit date: 12/04/2012    Years since quitting: 7.1  . Smokeless tobacco: Never Used  Substance Use Topics  . Alcohol use: Yes    Comment: occasional     Family History :  Reviewed by me    Family History  Problem Relation Age of Onset  . GER disease Mother   . Diabetes Mother   . Hypertension Mother   . Heart attack Father 78  . Heart disease Father   . Migraines Sister   . Breast cancer Maternal Grandmother   . Stroke Maternal Grandmother   . Breast cancer Cousin   . Stroke Maternal Aunt     Home Medications:   Prior to Admission medications   Medication Sig Start Date End Date Taking? Authorizing Provider  ergocalciferol (VITAMIN D2) 1.25 MG (50000 UT) capsule Take 1 capsule (50,000 Units total) by mouth once a week. One capsule once weekly 04/15/19  Yes Fayrene Helper, MD  ferrous sulfate (FEROSUL) 325 (65 FE) MG tablet Take 1 tablet (325 mg total) by mouth 3 (three) times daily. 10/13/19  Yes Fayrene Helper, MD  losartan (COZAAR) 100 MG tablet Take 1 tablet (100 mg total) by mouth daily. 12/11/19 03/10/20 Yes Fay Records, MD  spironolactone (ALDACTONE) 25 MG tablet Take 0.5 tablets (12.5 mg total) by mouth daily.  12/14/19 03/13/20 Yes Fay Records, MD  triamterene-hydrochlorothiazide (MAXZIDE-25) 37.5-25 MG tablet Take 1/2 tablet daily Patient taking differently: Take 0.5 tablets by mouth daily. Take 1/2 tablet daily 12/11/19  Yes Fay Records, MD     Allergies:     Allergies  Allergen Reactions  . Amlodipine     Headache   . Tomato     Processed tomatoes in the can.      Physical Exam:   Vitals  Blood pressure (!) 152/131, pulse 65, temperature 98.3 F (36.8 C), temperature source Oral, resp. rate 16, last menstrual period 02/01/2020, SpO2 100 %.  Physical Examination: General appearance - alert, well appearing, and in no distress  Mental status - alert, oriented to person, place, and time, patient is anxious Eyes - sclera anicteric Neck - supple, no JVD elevation , Chest - clear  to auscultation bilaterally, symmetrical air movement,  Heart - S1 and S2 normal, regular  Abdomen - soft, nontender, nondistended, no masses or organomegaly Neurological - screening mental status exam normal, neck supple without rigidity, cranial nerves II through XII intact, DTR's normal and symmetric Extremities - no pedal edema noted, intact peripheral pulses Skin - warm, dry     Data Review:    CBC Recent Labs  Lab 02/08/20 1002  WBC 123.3*  HGB 9.3*  HCT 29.2*  PLT 186  MCV 96.4  MCH 30.7  MCHC 31.8  RDW 16.8*  LYMPHSABS 2.5  MONOABS 6.2*  EOSABS 3.7*  BASOSABS 1.2*   ------------------------------------------------------------------------------------------------------------------  Chemistries  Recent Labs  Lab 02/08/20 1002  NA 140  K 3.4*  CL 104  CO2 27  GLUCOSE 92  BUN 15  CREATININE 1.04*  CALCIUM 9.3   ------------------------------------------------------------------------------------------------------------------ CrCl cannot be calculated (Unknown ideal  weight.). ------------------------------------------------------------------------------------------------------------------ No results for input(s): TSH, T4TOTAL, T3FREE, THYROIDAB in the last 72 hours.  Invalid input(s): FREET3   Coagulation profile No results for input(s): INR, PROTIME in the last 168 hours. ------------------------------------------------------------------------------------------------------------------- No results for input(s): DDIMER in the last 72 hours. -------------------------------------------------------------------------------------------------------------------  Cardiac Enzymes No results for input(s): CKMB, TROPONINI, MYOGLOBIN in the last 168 hours.  Invalid input(s): CK ------------------------------------------------------------------------------------------------------------------    Component Value Date/Time   BNP 57.0 02/08/2020 1002     ---------------------------------------------------------------------------------------------------------------  Urinalysis    Component Value Date/Time   COLORURINE YELLOW 11/29/2009 2329   APPEARANCEUR CLEAR 11/29/2009 2329   LABSPEC 1.025 11/29/2009 2329   PHURINE 5.5 11/29/2009  [place] 11/29/2009 2329   HGBUR MODERATE (A) 11/29/2009 2329   BILIRUBINUR NEGATIVE 11/29/2009 2329   KETONESUR TRACE (A) 11/29/2009 2329   PROTEINUR NEGATIVE 11/29/2009 2329   UROBILINOGEN 0.2 11/29/2009 2329   NITRITE NEGATIVE 11/29/2009 2329   LEUKOCYTESUR NEGATIVE 11/29/2009 2329    ----------------------------------------------------------------------------------------------------------------   Imaging Results:    CT ANGIO CHEST PE W OR WO CONTRAST  Result Date: 02/08/2020 CLINICAL DATA:  Central chest pain and shortness of breath for 2 days, pain radiates to left side neck, hypertension EXAM: CT ANGIOGRAPHY CHEST WITH CONTRAST TECHNIQUE: Multidetector CT imaging of the chest was performed using  the standard protocol during bolus administration of intravenous contrast. Multiplanar CT image reconstructions and MIPs were obtained to evaluate the vascular anatomy. CONTRAST:  157mL OMNIPAQUE IOHEXOL 350 MG/ML SOLN COMPARISON:  02/08/2020 FINDINGS: Cardiovascular: This is a technically adequate evaluation of the pulmonary vasculature. No filling defects or pulmonary emboli. Heart is unremarkable without pericardial effusion. Thoracic aorta is normal. Mediastinum/Nodes: No enlarged mediastinal, hilar, or axillary lymph nodes. Thyroid gland, trachea, and esophagus demonstrate no significant findings. Lungs/Pleura: No airspace disease, effusion, or pneumothorax. Central airways are widely patent. Upper Abdomen: Numerous hepatic cysts are identified. Mild splenic enlargement measuring 14 cm in anterior-posterior dimension. This is incompletely imaged on this study. Musculoskeletal: No acute or destructive bony lesions. Reconstructed images demonstrate no additional findings. Review of the MIP images confirms the above findings. IMPRESSION: 1. No evidence of pulmonary embolus. 2. No acute intrathoracic process. 3. Splenomegaly. Electronically Signed   By: Randa Ngo M.D.   On: 02/08/2020 18:41   DG Chest Portable 1 View  Result Date: 02/08/2020 CLINICAL DATA:  Chest pain EXAM: PORTABLE CHEST 1 VIEW COMPARISON:  January 25, 2015 FINDINGS: The lungs are clear. Heart is upper normal in size with pulmonary vascularity normal. No adenopathy. No pneumothorax. No bone lesions. IMPRESSION: Lungs clear.  Heart upper normal in size. Electronically Signed   By: Lowella Grip III M.D.   On: 02/08/2020 09:59    Radiological Exams on Admission: CT ANGIO CHEST PE W OR WO CONTRAST  Result Date: 02/08/2020 CLINICAL DATA:  Central chest pain and shortness of breath for 2 days, pain radiates to left side neck, hypertension EXAM: CT ANGIOGRAPHY CHEST WITH CONTRAST TECHNIQUE: Multidetector CT imaging of the chest was  performed using the standard protocol during bolus administration of intravenous contrast. Multiplanar CT image reconstructions and MIPs were obtained to evaluate the vascular anatomy. CONTRAST:  140mL OMNIPAQUE IOHEXOL 350 MG/ML SOLN COMPARISON:  02/08/2020 FINDINGS: Cardiovascular: This is a technically adequate evaluation of the pulmonary vasculature. No filling defects or pulmonary emboli. Heart is unremarkable without pericardial effusion. Thoracic aorta is normal. Mediastinum/Nodes: No enlarged mediastinal, hilar, or axillary lymph nodes. Thyroid gland, trachea, and esophagus demonstrate no significant findings. Lungs/Pleura: No airspace disease, effusion, or pneumothorax. Central airways are widely patent. Upper Abdomen: Numerous hepatic cysts are identified. Mild splenic enlargement measuring 14 cm in anterior-posterior dimension. This is incompletely imaged on this study. Musculoskeletal: No acute or destructive bony lesions. Reconstructed images demonstrate no additional findings. Review of the MIP images confirms the above findings. IMPRESSION: 1. No evidence of pulmonary embolus. 2. No acute intrathoracic process. 3. Splenomegaly. Electronically Signed   By: Randa Ngo M.D.   On: 02/08/2020 18:41   DG Chest Portable 1 View  Result Date: 02/08/2020 CLINICAL DATA:  Chest pain EXAM: PORTABLE CHEST 1 VIEW COMPARISON:  January 25, 2015 FINDINGS: The lungs are clear. Heart is  upper normal in size with pulmonary vascularity normal. No adenopathy. No pneumothorax. No bone lesions. IMPRESSION: Lungs clear.  Heart upper normal in size. Electronically Signed   By: Lowella Grip III M.D.   On: 02/08/2020 09:59    DVT Prophylaxis -SCD /heparin AM Labs Ordered, also please review Full Orders  Family Communication: Admission, patients condition and plan of care including tests being ordered have been discussed with the patient who indicate understanding and agree with the plan   Code Status - Full  Code  Likely DC to home in 1 to 2 days after work-up  Condition   stable  Roxan Hockey M.D on 02/08/2020 at 6:54 PM Go to [email or web address] -  for contact info  [facility] - Office  [phone]

## 2020-02-08 NOTE — ED Notes (Signed)
CRITICAL VALUE ALERT  Critical Value:  WBC 123,300  Date & Time Notied:  02/08/20 1030  Provider Notified: Evalee Jefferson, PA  Orders Received/Actions taken: EDP notified, differential added on to CBC

## 2020-02-08 NOTE — Consult Note (Signed)
[facility] Consultation Oncology  Name: Marcia Hahn      MRN: [id number]    Location: A319/A319-01  Date: 02/08/2020 Time:5:21 PM   REFERRING PHYSICIAN: Dr. Joesph Fillers  REASON FOR CONSULT: Leukocytosis  HISTORY OF PRESENT ILLNESS: Marcia Hahn is a 44 year old very pleasant African-American female who is seen in consultation today for further work-up and management of leukocytosis.  She came to the ER today with complaints of shortness of breath and chest pressure.  Shortness of breath and chest pressure started Saturday.  No pleuritic chest pain.  She thought it was secondary to acid reflux and took Gas-X without relief.  CBC showed white count elevated at 123.  Hemoglobin is 9.3 and platelet count was 186.  Differential showed 50% neutrophils, 2% lymphocytes, 5% monocytes, 3% eosinophils, 1% basophil.  9% myelocytes-10% metamyelocytes were seen.  Rare nucleated RBCs were seen.  Her previous CBC on 04/01/2019 was 8.4 with hemoglobin of 11.3.  She denied any fevers.  She does have occasional hot flashes but denied any night sweats.  No significant weight loss in the last 6 months.  Denies any headaches or vision changes.  She has history of hypertension and takes medication for it.  She also takes vitamin D and iron tablet.  Her last mammogram was on 10/19/2019, BI-RADS Category 1.  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  No chemical exposure including pesticides.  She lives at home with her family.  She is a non-smoker.  Family history significant for maternal uncle who died of leukemia.  Maternal cousin had breast cancer.  PAST MEDICAL HISTORY:   Past Medical History:  Diagnosis Date  . Acne vulgaris   . Hypertension   . Mild obesity     ALLERGIES: Allergies  Allergen Reactions  . Amlodipine     Headache   . Tomato     Processed tomatoes in the can.       MEDICATIONS: I have reviewed the patient's current medications.     PAST SURGICAL  HISTORY Past Surgical History:  Procedure Laterality Date  . BREAST BIOPSY Left   . CESAREAN SECTION      FAMILY HISTORY: Family History  Problem Relation Age of Onset  . GER disease Mother   . Diabetes Mother   . Hypertension Mother   . Heart attack Father 99  . Heart disease Father   . Migraines Sister   . Breast cancer Maternal Grandmother   . Stroke Maternal Grandmother   . Breast cancer Cousin   . Stroke Maternal Aunt     SOCIAL HISTORY:  reports that she quit smoking about 7 years ago. Her smoking use included cigarettes. She has never used smokeless tobacco. She reports current alcohol use. She reports that she does not use drugs.  PERFORMANCE STATUS: The patient's performance status is 0 - Asymptomatic  PHYSICAL EXAM: Most Recent Vital Signs: Blood pressure (!) 152/131, pulse 65, temperature 98.3 F (36.8 C), temperature source Oral, resp. rate 16, last menstrual period 02/01/2020, SpO2 100 %. BP (!) 152/131 (BP Location: Right Arm)   Pulse 65   Temp 98.3 F (36.8 C) (Oral)   Resp 16   LMP 02/01/2020   SpO2 100%  General appearance: alert, cooperative and appears stated age Head: Normocephalic, without obvious abnormality, atraumatic Neck: no adenopathy and supple, symmetrical, trachea midline Lungs: clear to auscultation bilaterally Heart: regular rate and rhythm Abdomen: soft, non-tender; bowel sounds normal; no masses,  no organomegaly Extremities:  extremities normal, atraumatic, no cyanosis or edema Skin: Skin color, texture, turgor normal. No rashes or lesions Lymph nodes: Cervical, supraclavicular, and axillary nodes normal. Neurologic: Grossly normal  LABORATORY DATA:  Results for orders placed or performed during the hospital encounter of 02/08/20 (from the past 48 hour(s))  Basic metabolic panel     Status: Abnormal   Collection Time: 02/08/20 10:02 AM  Result Value Ref Range   Sodium 140 135 - 145 mmol/L   Potassium 3.4 (L) 3.5 - 5.1 mmol/L    Chloride 104 98 - 111 mmol/L   CO2 27 22 - 32 mmol/L   Glucose, Bld 92 70 - 99 mg/dL    Comment: Glucose reference range applies only to samples taken after fasting for at least 8 hours.   BUN 15 6 - 20 mg/dL   Creatinine, Ser 1.04 (H) 0.44 - 1.00 mg/dL   Calcium 9.3 8.9 - 10.3 mg/dL   GFR calc non Af Amer >60 >60 mL/min   GFR calc Af Amer >60 >60 mL/min   Anion gap 9 5 - 15    Comment: Performed at [facility], [address] [place], [place] [address]  Troponin I (High Sensitivity)     Status: None   Collection Time: 02/08/20 10:02 AM  Result Value Ref Range   Troponin I (High Sensitivity) 3 <18 ng/L    Comment: (NOTE) Elevated high sensitivity troponin I (hsTnI) values and significant  changes across serial measurements may suggest ACS but many other  chronic and acute conditions are known to elevate hsTnI results.  Refer to the "Links" section for chest pain algorithms and additional  guidance. Performed at [facility], [address], [place], [place] [address]   CBC     Status: Abnormal   Collection Time: 02/08/20 10:02 AM  Result Value Ref Range   WBC 123.3 (HH) 4.0 - 10.5 K/uL    Comment: WHITE COUNT CONFIRMED ON SMEAR THIS CRITICAL RESULT HAS VERIFIED AND BEEN CALLED TO WHITE,M BY BOBBIE MATTHEWS ON 03 29 2021 AT 1029, AND HAS BEEN READ BACK.  PENDING PATH REVIEW CORRECTED ON 03/29 AT 1031: PREVIOUSLY REPORTED AS 123.3 WHITE COUNT CONFIRMED ON SMEAR THIS CRITICAL RESULT HAS VERIFIED AND BEEN CALLED TO WHITE,M BY BOBBIE MATTHEWS ON 03 29 2021 AT 1029, AND HAS BEEN READ BACK.     RBC 3.03 (L) 3.87 - 5.11 MIL/uL   Hemoglobin 9.3 (L) 12.0 - 15.0 g/dL   HCT 29.2 (L) 36.0 - 46.0 %   MCV 96.4 80.0 - 100.0 fL   MCH 30.7 26.0 - 34.0 pg   MCHC 31.8 30.0 - 36.0 g/dL   RDW 16.8 (H) 11.5 - 15.5 %   Platelets 186 150 - 400 K/uL   nRBC 1.2 (H) 0.0 - 0.2 %    Comment: Performed at [facility], [address], [place], [place] [address]  Brain natriuretic peptide     Status: None    Collection Time: 02/08/20 10:02 AM  Result Value Ref Range   B Natriuretic Peptide 57.0 0.0 - 100.0 pg/mL    Comment: Performed at [facility], [address] [place], [place] [address]  Differential     Status: Abnormal   Collection Time: 02/08/20 10:02 AM  Result Value Ref Range   Neutrophils Relative % 50 %   Neutro Abs 78.9 (H) 1.7 - 7.7 K/uL   Band Neutrophils 14 %   Lymphocytes Relative 2 %   Lymphs Abs 2.5 0.7 - 4.0 K/uL  Monocytes Relative 5 %   Monocytes Absolute 6.2 (H) 0.1 - 1.0 K/uL   Eosinophils Relative 3 %   Eosinophils Absolute 3.7 (H) 0.0 - 0.5 K/uL   Basophils Relative 1 %   Basophils Absolute 1.2 (H) 0.0 - 0.1 K/uL   WBC Morphology PENDING PATH REVIEW     Comment: MODERATE LEFT SHIFT (>5% METAS AND MYELOS,OCC PRO NOTED)   RBC Morphology RARE NUCLEATED RBCS PRESENT    Other 6 %   Metamyelocytes Relative 10 %   Myelocytes 9 %    Comment: Performed at [facility], [address], [place], [place] [address]  Lactate dehydrogenase     Status: Abnormal   Collection Time: 02/08/20 10:02 AM  Result Value Ref Range   LDH 903 (H) 98 - 192 U/L    Comment: Performed at [facility], [address] [place], [place] [address]  Uric acid     Status: Abnormal   Collection Time: 02/08/20 10:02 AM  Result Value Ref Range   Uric Acid, Serum 9.7 (H) 2.5 - 7.1 mg/dL    Comment: Performed at [facility], [address] [place], [place] [address]  Troponin I (High Sensitivity)     Status: None   Collection Time: 02/08/20 12:09 PM  Result Value Ref Range   Troponin I (High Sensitivity) 2 <18 ng/L    Comment: (NOTE) Elevated high sensitivity troponin I (hsTnI) values and significant  changes across serial measurements may suggest ACS but many other  chronic and acute conditions are known to elevate hsTnI results.  Refer to the "Links" section for chest pain algorithms and additional  guidance. Performed at [facility], [address] [place], [place] [address]   POC  urine preg, ED     Status: None   Collection Time: 02/08/20 12:53 PM  Result Value Ref Range   Preg Test, Ur NEGATIVE NEGATIVE    Comment:        THE SENSITIVITY OF THIS METHODOLOGY IS >24 mIU/mL   Glucose, capillary     Status: None   Collection Time: 02/08/20  4:06 PM  Result Value Ref Range   Glucose-Capillary 76 70 - 99 mg/dL    Comment: Glucose reference range applies only to samples taken after fasting for at least 8 hours.      RADIOGRAPHY: DG Chest Portable 1 View  Result Date: 02/08/2020 CLINICAL DATA:  Chest pain EXAM: PORTABLE CHEST 1 VIEW COMPARISON:  January 25, 2015 FINDINGS: The lungs are clear. Heart is upper normal in size with pulmonary vascularity normal. No adenopathy. No pneumothorax. No bone lesions. IMPRESSION: Lungs clear.  Heart upper normal in size. Electronically Signed   By: Lowella Grip III M.D.   On: 02/08/2020 09:59         ASSESSMENT and PLAN:  1.  Leukocytosis: -Presentation with shortness of breath and chest pressure started 02/06/2020.  I have talked to ER doctor and facilitated the admission to monitor for signs of hyperleukocytosis. -Denies any fevers, night sweats or weight loss in the last 6 months. -Last CBC in May 2020 showed normal white count of 8.3. -CBC in the ER showed white count 123.3 with hemoglobin 9.3 and platelet count 186.  Neutrophils were 50%, lymphocytes 2%, monocytes 5%, 3% eosinophils, 1% basophil, 10% metamyelocytes and 9% myelocytes.  Increased nucleated RBC. -LDH was elevated at 903.  Uric acid was 9.7. -I will obtain the smear and review it. -I have sent blood for BCR/ABL to evaluate for CML. -Given  elevated LDH, will also send for flow cytometry of the peripheral blood. -I have also discussed with her the need for bone marrow aspiration and biopsy to establish diagnosis. -She is scheduled to have a CT scan of the chest PE protocol.  We will follow up on it.  2.  Tumor lysis: -Her uric acid is elevated at 9.7 with  elevated LDH.  Creatinine also elevated at 1.18.  This is likely from tumor lysis. -Would recommend gentle IV hydration and allopurinol 300 mg daily.    3 hypertension: -She is on losartan 50 mg daily as outpatient.  4.  Normocytic anemia: -Hemoglobin 9.3 with MCV of 96.4.  She has been taking iron tablet daily as outpatient. -Likely from bone marrow infiltration.  All questions were answered. The patient knows to call the clinic with any problems, questions or concerns. We can certainly see the patient much sooner if necessary.    Derek Jack

## 2020-02-09 ENCOUNTER — Other Ambulatory Visit: Payer: Self-pay | Admitting: Family Medicine

## 2020-02-09 DIAGNOSIS — E8881 Metabolic syndrome: Secondary | ICD-10-CM

## 2020-02-09 LAB — CBC
HCT: 27.6 % — ABNORMAL LOW (ref 36.0–46.0)
Hemoglobin: 8.8 g/dL — ABNORMAL LOW (ref 12.0–15.0)
MCH: 30.8 pg (ref 26.0–34.0)
MCHC: 31.9 g/dL (ref 30.0–36.0)
MCV: 96.5 fL (ref 80.0–100.0)
Platelets: 179 10*3/uL (ref 150–400)
RBC: 2.86 MIL/uL — ABNORMAL LOW (ref 3.87–5.11)
RDW: 16.7 % — ABNORMAL HIGH (ref 11.5–15.5)
WBC: 115.6 10*3/uL (ref 4.0–10.5)
nRBC: 1.2 % — ABNORMAL HIGH (ref 0.0–0.2)

## 2020-02-09 LAB — BASIC METABOLIC PANEL
Anion gap: 8 (ref 5–15)
BUN: 12 mg/dL (ref 6–20)
CO2: 24 mmol/L (ref 22–32)
Calcium: 8.6 mg/dL — ABNORMAL LOW (ref 8.9–10.3)
Chloride: 108 mmol/L (ref 98–111)
Creatinine, Ser: 0.97 mg/dL (ref 0.44–1.00)
GFR calc Af Amer: >60 mL/min (ref >60)
GFR calc non Af Amer: >60 mL/min (ref >60)
Glucose, Bld: 94 mg/dL (ref 70–99)
Potassium: 3.4 mmol/L — ABNORMAL LOW (ref 3.5–5.1)
Sodium: 140 mmol/L (ref 135–145)

## 2020-02-09 LAB — URIC ACID: Uric Acid, Serum: 8.4 mg/dL — ABNORMAL HIGH (ref 2.5–7.1)

## 2020-02-09 LAB — PATHOLOGIST SMEAR REVIEW

## 2020-02-09 LAB — SURGICAL PATHOLOGY

## 2020-02-09 LAB — GLUCOSE, CAPILLARY: Glucose-Capillary: 85 mg/dL (ref 70–99)

## 2020-02-09 LAB — HIV ANTIBODY (ROUTINE TESTING W REFLEX): HIV Screen 4th Generation wRfx: NONREACTIVE

## 2020-02-09 LAB — LACTATE DEHYDROGENASE: LDH: 664 U/L — ABNORMAL HIGH (ref 98–192)

## 2020-02-09 LAB — FLOW CYTOMETRY REQUEST - FLUID (INPATIENT)

## 2020-02-09 LAB — FIBRINOGEN: Fibrinogen: 261 mg/dL (ref 210–475)

## 2020-02-09 MED ORDER — ADULT MULTIVITAMIN W/MINERALS CH: 1.0000 | ORAL_TABLET | Freq: Every day | ORAL | 3 refills | Status: DC

## 2020-02-09 MED ORDER — POTASSIUM CHLORIDE CRYS ER 20 MEQ PO TBCR
40.0000 meq | EXTENDED_RELEASE_TABLET | ORAL | Status: AC
  Administered 2020-02-09 (×2): 40 meq via ORAL
  Filled 2020-02-09 (×2): qty 2

## 2020-02-09 MED ORDER — SPIRONOLACTONE 25 MG PO TABS: 12.5000 mg | ORAL_TABLET | Freq: Every day | ORAL | 3 refills | Status: DC

## 2020-02-09 MED ORDER — LOSARTAN POTASSIUM 50 MG PO TABS: 50.0000 mg | ORAL_TABLET | Freq: Every day | ORAL | 3 refills | Status: DC

## 2020-02-09 MED ORDER — FERROUS SULFATE 325 (65 FE) MG PO TABS: 325.0000 mg | ORAL_TABLET | Freq: Two times a day (BID) | ORAL | 3 refills | Status: DC

## 2020-02-09 NOTE — Discharge Summary (Signed)
Marcia Hahn, is a 44 y.o. female  DOB 1976-07-06  MRN [id number].  Admission date:  02/08/2020  Admitting Physician  Roxan Hockey, MD  Discharge Date:  02/09/2020   Primary MD  Fayrene Helper, MD  Recommendations for primary care physician for things to follow:   1)Hold Losartan and Aldactone until February 11, 2020 2)Follow-up with hematology/oncology Dr. Tera Helper at 3)Address: inside [facility], [place], [place], [place] [address],....,,, Phone: [phone] within 1 week for recheck and reevaluation and possible bone marrow biopsy   Admission Diagnosis  Other chest pain [R07.89] Leukocytosis [D72.829] SOB (shortness of breath) [R06.02] Leukocytosis, unspecified type [D72.829]   Discharge Diagnosis  Other chest pain [R07.89] Leukocytosis [D72.829] SOB (shortness of breath) [R06.02] Leukocytosis, unspecified type [D72.829]    Principal Problem:   Leukocytosis---??? CML Active Problems:   Metabolic syndrome X   Benign essential HTN   Acute on chronic anemia   SOB (shortness of breath)      Past Medical History:  Diagnosis Date  . Acne vulgaris   . Hypertension   . Mild obesity     Past Surgical History:  Procedure Laterality Date  . BREAST BIOPSY Left   . CESAREAN SECTION       HPI  from the history and physical done on the day of admission:     Marcia Hahn  is a 44 y.o. female with past medical history relevant for this metabolic syndrome, chronic anemia, and hypertension who presents with chest discomfort and dyspnea and is found to have a white count of 123K Hemoglobin is 9.3 and platelet count was 186.  WBCDifferential showed 50% neutrophils, 2% lymphocytes, 5% monocytes, 3% eosinophils, 1% basophil. 9% myelocytes-10% metamyelocytes were seen. Rare nucleated RBCs were seen. Her previous CBC on 04/01/2019 was 8.4 with hemoglobin of 11.3.  --Discussed  with oncologist--- work-up in progress --CTA chest without acute findings -Hydrate,   Per oncologist admit and monitor for signs of hyperleukocytosis.  No fever  Or chills   No Nausea, Vomiting or Diarrhea     Hospital Course:       1) severe leukocytosis----possible CML, WBC 123K , repeat 115k , WBC was 8.4 on 04/01/2019 -Differential showed 50% neutrophils, 2% lymphocytes, 5% monocytes, 3% eosinophils, 1% basophil. 9% myelocytes-10% metamyelocytes were seen. Rare nucleated RBCs were seen No fever  Or chills -CTA chest without acute findings -BCR/ABL pending,  -elevated LDH at 903 >> 664 and uric acid at 9.7 >>8.4 noted--allopurinol started concerns about tumor lysis -Discussed with Dr. Delton Coombes -Patient may need bone marrow aspiration for definitive diagnosis -We monitored patient for signs of hyperleukocytosis. -Allopurinol called into patient's pharmacy  2)HTN--restart losartan and Aldactone on 02/11/2020 due to contrast study  3) acute on chronic anemia----patient hemoglobin usually around 11 hemoglobin currently 8.8 -Hematology consult appreciated -Denies blood in stool   Discharge Condition: Stable  Follow UP  Follow-up Information    Derek Jack, MD. Schedule an appointment as soon as possible for a visit in 1 week(s).  Specialty: Hematology Why: within 1 week for recheck and reevaluation and possible bone marrow biopsy Contact information: [address] [place] [address] [phone]            Consults obtained - oncology  Diet and Activity recommendation:  As advised  Discharge Instructions    Discharge Instructions    Call MD for:  difficulty breathing, headache or visual disturbances   Complete by: As directed    Call MD for:  persistant dizziness or light-headedness   Complete by: As directed    Call MD for:  persistant nausea and vomiting   Complete by: As directed    Call MD for:  temperature >100.4   Complete by:  As directed    Diet - low sodium heart healthy   Complete by: As directed    Discharge instructions   Complete by: As directed    1)Hold Losartan and Aldactone until February 11, 2020 2)Follow-up with hematology/oncology Dr. Tera Helper at 3)Address: inside [facility], [place], [place], [place] [address],....,,, Phone: [phone] within 1 week for recheck and reevaluation and possible bone marrow biopsy  .   Increase activity slowly   Complete by: As directed         Discharge Medications     Allergies as of 02/09/2020      Reactions   Amlodipine    Headache    Tomato    Processed tomatoes in the can.       Medication List    STOP taking these medications   triamterene-hydrochlorothiazide 37.5-25 MG tablet Commonly known as: Maxzide-25     TAKE these medications   ergocalciferol 1.25 MG (50000 UT) capsule Commonly known as: VITAMIN D2 Take 1 capsule (50,000 Units total) by mouth once a week. One capsule once weekly   ferrous sulfate 325 (65 FE) MG tablet Commonly known as: FeroSul Take 1 tablet (325 mg total) by mouth 2 (two) times daily with a meal. What changed: when to take this   losartan 50 MG tablet Commonly known as: COZAAR Take 1 tablet (50 mg total) by mouth daily. Start taking on: February 11, 2020 What changed:   medication strength  how much to take  These instructions start on February 11, 2020. If you are unsure what to do until then, ask your doctor or other care provider.   multivitamin with minerals Tabs tablet Take 1 tablet by mouth daily.   spironolactone 25 MG tablet Commonly known as: ALDACTONE Take 0.5 tablets (12.5 mg total) by mouth daily. Start taking on: February 11, 2020 What changed: These instructions start on February 11, 2020. If you are unsure what to do until then, ask your doctor or other care provider.       Major procedures and Radiology Reports - PLEASE review detailed and final reports for all details, in brief -   CT  ANGIO CHEST PE W OR WO CONTRAST  Result Date: 02/08/2020 CLINICAL DATA:  Central chest pain and shortness of breath for 2 days, pain radiates to left side neck, hypertension EXAM: CT ANGIOGRAPHY CHEST WITH CONTRAST TECHNIQUE: Multidetector CT imaging of the chest was performed using the standard protocol during bolus administration of intravenous contrast. Multiplanar CT image reconstructions and MIPs were obtained to evaluate the vascular anatomy. CONTRAST:  154m OMNIPAQUE IOHEXOL 350 MG/ML SOLN COMPARISON:  02/08/2020 FINDINGS: Cardiovascular: This is a technically adequate evaluation of the pulmonary vasculature. No filling defects or pulmonary emboli. Heart is unremarkable without pericardial effusion.  Thoracic aorta is normal. Mediastinum/Nodes: No enlarged mediastinal, hilar, or axillary lymph nodes. Thyroid gland, trachea, and esophagus demonstrate no significant findings. Lungs/Pleura: No airspace disease, effusion, or pneumothorax. Central airways are widely patent. Upper Abdomen: Numerous hepatic cysts are identified. Mild splenic enlargement measuring 14 cm in anterior-posterior dimension. This is incompletely imaged on this study. Musculoskeletal: No acute or destructive bony lesions. Reconstructed images demonstrate no additional findings. Review of the MIP images confirms the above findings. IMPRESSION: 1. No evidence of pulmonary embolus. 2. No acute intrathoracic process. 3. Splenomegaly. Electronically Signed   By: Randa Ngo M.D.   On: 02/08/2020 18:41   DG Chest Portable 1 View  Result Date: 02/08/2020 CLINICAL DATA:  Chest pain EXAM: PORTABLE CHEST 1 VIEW COMPARISON:  January 25, 2015 FINDINGS: The lungs are clear. Heart is upper normal in size with pulmonary vascularity normal. No adenopathy. No pneumothorax. No bone lesions. IMPRESSION: Lungs clear.  Heart upper normal in size. Electronically Signed   By: Lowella Grip III M.D.   On: 02/08/2020 09:59    Micro Results     Recent Results (from the past 240 hour(s))  SARS CORONAVIRUS 2 (TAT 6-24 HRS) Nasopharyngeal Nasopharyngeal Swab     Status: None   Collection Time: 02/08/20  1:22 PM   Specimen: Nasopharyngeal Swab  Result Value Ref Range Status   SARS Coronavirus 2 NEGATIVE NEGATIVE Final    Comment: (NOTE) SARS-CoV-2 target nucleic acids are NOT DETECTED. The SARS-CoV-2 RNA is generally detectable in upper and lower respiratory specimens during the acute phase of infection. Negative results do not preclude SARS-CoV-2 infection, do not rule out co-infections with other pathogens, and should not be used as the sole basis for treatment or other patient management decisions. Negative results must be combined with clinical observations, patient history, and epidemiological information. The expected result is Negative. Fact Sheet for Patients: SugarRoll.be Fact Sheet for Healthcare Providers: https://www.woods-mathews.com/ This test is not yet approved or cleared by the [place] FDA and  has been authorized for detection and/or diagnosis of SARS-CoV-2 by FDA under an Emergency Use Authorization (EUA). This EUA will remain  in effect (meaning this test can be used) for the duration of the COVID-19 declaration under Section 56 4(b)(1) of the Act, 21 U.S.C. section 360bbb-3(b)(1), unless the authorization is terminated or revoked sooner. Performed at [facility], [place] [address], [place], [place] [address]        Today   Subjective    Marcia Hahn today has no complaints No fever  Or chills   No Nausea, Vomiting or Diarrhea  --- Complains of fatigue,  no dizziness no shortness of breath no palpitations            Patient has been seen and examined prior to discharge   Objective   Blood pressure 127/74, pulse 73, temperature 98.2 F (36.8 C), temperature source Oral, resp. rate 16, last menstrual period 02/01/2020, SpO2 100  %.   Intake/Output Summary (Last 24 hours) at 02/09/2020 1113 Last data filed at 02/09/2020 0900 Gross per 24 hour  Intake 1931.85 ml  Output --  Net 1931.85 ml    Exam Gen:- Awake Alert, no acute distress  HEENT:- [place].AT, No sclera icterus Neck-Supple Neck,No JVD,.  Lungs-  CTAB , good air movement bilaterally  CV- S1, S2 normal, regular Abd-  +ve B.Sounds, Abd Soft, No tenderness,    Extremity/Skin:- No  edema,   good pulses Psych-affect is appropriate, oriented x3 Neuro-no new focal deficits,  no tremors    Data Review   CBC w Diff:  Lab Results  Component Value Date   WBC 115.6 (HH) 02/09/2020   HGB 8.8 (L) 02/09/2020   HCT 27.6 (L) 02/09/2020   PLT 179 02/09/2020   LYMPHOPCT 2 02/08/2020   BANDSPCT 14 02/08/2020   MONOPCT 5 02/08/2020   EOSPCT 3 02/08/2020   BASOPCT 1 02/08/2020    CMP:  Lab Results  Component Value Date   NA 140 02/09/2020   K 3.4 (L) 02/09/2020   CL 108 02/09/2020   CO2 24 02/09/2020   BUN 12 02/09/2020   CREATININE 0.97 02/09/2020   CREATININE 1.12 (H) 12/09/2019   PROT 6.6 10/07/2019   ALBUMIN 4.1 06/11/2017   BILITOT 0.4 10/07/2019   ALKPHOS 50 06/11/2017   AST 21 10/07/2019   ALT 19 10/07/2019  .   Total Discharge time is about 33 minutes  Roxan Hockey M.D on 02/09/2020 at 11:13 AM  Go to [email or web address] -  for contact info  [facility] - Office  [phone]

## 2020-02-09 NOTE — Discharge Instructions (Signed)
1)Hold Losartan and Aldactone until February 11, 2020 2)Follow-up with hematology/oncology Dr. Tera Helper at 3)Address: inside [facility], [place], [place],  [address],....,,, Phone: [phone] within 1 week for recheck and reevaluation and possible bone marrow biopsy

## 2020-02-09 NOTE — Progress Notes (Signed)
Discharge instructions reviewed with patient. Patient verbalized understanding of instructions. Patient discharged home in stable condition.  

## 2020-02-09 NOTE — Progress Notes (Signed)
CRITICAL VALUE ALERT  Critical Value:  WBC 115.6  Date & Time Notied:  02/09/20 [id number]  Provider Notified: Scherrie November  Orders Received/Actions taken: No new orders at this time

## 2020-02-10 ENCOUNTER — Other Ambulatory Visit: Payer: Self-pay

## 2020-02-10 ENCOUNTER — Inpatient Hospital Stay (HOSPITAL_COMMUNITY): Payer: BC Managed Care – PPO | Attending: Hematology

## 2020-02-10 DIAGNOSIS — D72829 Elevated white blood cell count, unspecified: Secondary | ICD-10-CM | POA: Diagnosis not present

## 2020-02-12 ENCOUNTER — Telehealth: Payer: Self-pay | Admitting: *Deleted

## 2020-02-12 NOTE — Telephone Encounter (Signed)
Transition Care Management Follow-up Telephone Call   Date discharged? 02-09-20      How have you been since you were released from the hospital? feels ok no chest pain since release   Do you understand why you were in the hospital? Yes    Do you understand the discharge instructions? Yes    Where were you discharged to? Home    Items Reviewed:  Medications reviewed: yes   Allergies reviewed: yes    Dietary changes reviewed: yes   Referrals reviewed: yes    Functional Questionnaire:   Activities of Daily Living (ADLs):  yes     Any transportation issues/concerns?: yes    Any patient concerns? no    Confirmed importance and date/time of follow-up visits scheduled yes      Confirmed with patient if condition begins to worsen call PCP or go to the ER.  Patient was given the office number and encouraged to call back with question or concerns.  :

## 2020-02-12 NOTE — Telephone Encounter (Signed)
-----   Message from Fayrene Helper, MD sent at 02/12/2020 10:22 AM EDT ----- Regarding: pls needs toc call and appt in 2 weeks, see d/c summary, thanks

## 2020-02-15 ENCOUNTER — Encounter: Payer: Self-pay | Admitting: Family Medicine

## 2020-02-15 ENCOUNTER — Ambulatory Visit: Payer: BC Managed Care – PPO | Admitting: Family Medicine

## 2020-02-15 ENCOUNTER — Other Ambulatory Visit: Payer: Self-pay

## 2020-02-15 ENCOUNTER — Inpatient Hospital Stay (HOSPITAL_COMMUNITY): Payer: BC Managed Care – PPO

## 2020-02-15 ENCOUNTER — Other Ambulatory Visit (HOSPITAL_COMMUNITY): Payer: Self-pay | Admitting: Hematology

## 2020-02-15 ENCOUNTER — Encounter (HOSPITAL_COMMUNITY): Payer: Self-pay | Admitting: *Deleted

## 2020-02-15 ENCOUNTER — Inpatient Hospital Stay (HOSPITAL_COMMUNITY): Payer: BC Managed Care – PPO | Attending: Hematology | Admitting: Hematology

## 2020-02-15 ENCOUNTER — Encounter (HOSPITAL_COMMUNITY): Payer: Self-pay | Admitting: Hematology

## 2020-02-15 VITALS — BP 126/68 | HR 86 | Temp 97.7°F | Resp 20 | Ht 67.0 in | Wt 208.2 lb

## 2020-02-15 VITALS — BP 131/83 | HR 87 | Temp 97.5°F | Wt 208.0 lb

## 2020-02-15 DIAGNOSIS — C9200 Acute myeloblastic leukemia, not having achieved remission: Secondary | ICD-10-CM | POA: Diagnosis not present

## 2020-02-15 DIAGNOSIS — I10 Essential (primary) hypertension: Secondary | ICD-10-CM

## 2020-02-15 DIAGNOSIS — E883 Tumor lysis syndrome: Secondary | ICD-10-CM | POA: Diagnosis not present

## 2020-02-15 DIAGNOSIS — Z87891 Personal history of nicotine dependence: Secondary | ICD-10-CM | POA: Insufficient documentation

## 2020-02-15 DIAGNOSIS — D649 Anemia, unspecified: Secondary | ICD-10-CM

## 2020-02-15 DIAGNOSIS — C9210 Chronic myeloid leukemia, BCR/ABL-positive, not having achieved remission: Secondary | ICD-10-CM | POA: Insufficient documentation

## 2020-02-15 DIAGNOSIS — D500 Iron deficiency anemia secondary to blood loss (chronic): Secondary | ICD-10-CM

## 2020-02-15 DIAGNOSIS — D72829 Elevated white blood cell count, unspecified: Secondary | ICD-10-CM

## 2020-02-15 LAB — CBC WITH DIFFERENTIAL/PLATELET
Abs Immature Granulocytes: 14.02 10*3/uL — ABNORMAL HIGH (ref 0.00–0.07)
Band Neutrophils: 8 %
Basophils Absolute: 0 10*3/uL (ref 0.0–0.1)
Basophils Relative: 0 %
Blasts: 0 %
Eosinophils Absolute: 2.8 10*3/uL — ABNORMAL HIGH (ref 0.0–0.5)
Eosinophils Relative: 2 %
HCT: 27.9 % — ABNORMAL LOW (ref 36.0–46.0)
Hemoglobin: 8.8 g/dL — ABNORMAL LOW (ref 12.0–15.0)
Lymphocytes Relative: 4 %
Lymphs Abs: 5.6 10*3/uL — ABNORMAL HIGH (ref 0.7–4.0)
MCH: 30.6 pg (ref 26.0–34.0)
MCHC: 31.5 g/dL (ref 30.0–36.0)
MCV: 96.9 fL (ref 80.0–100.0)
Metamyelocytes Relative: 6 %
Monocytes Absolute: 4.2 10*3/uL — ABNORMAL HIGH (ref 0.1–1.0)
Monocytes Relative: 3 %
Myelocytes: 4 %
Neutro Abs: 74.3 10*3/uL — ABNORMAL HIGH (ref 1.7–7.7)
Neutrophils Relative %: 45 %
Other: 28 %
Platelets: 188 10*3/uL (ref 150–400)
Promyelocytes Relative: 0 %
RBC: 2.88 MIL/uL — ABNORMAL LOW (ref 3.87–5.11)
RDW: 17.2 % — ABNORMAL HIGH (ref 11.5–15.5)
WBC: 140.2 10*3/uL (ref 4.0–10.5)
nRBC: 2.1 % — ABNORMAL HIGH (ref 0.0–0.2)
nRBC: 7 /100 WBC — ABNORMAL HIGH

## 2020-02-15 LAB — TYPE AND SCREEN
ABO/RH(D): B POS
Antibody Screen: NEGATIVE

## 2020-02-15 LAB — LACTATE DEHYDROGENASE: LDH: 711 U/L — ABNORMAL HIGH (ref 98–192)

## 2020-02-15 LAB — URIC ACID: Uric Acid, Serum: 6.3 mg/dL (ref 2.5–7.1)

## 2020-02-15 NOTE — Patient Instructions (Addendum)
[place] at [facility] Discharge Instructions  You were seen today by Dr. Delton Coombes. He went over your recent lab results. Some of your labs are still pending and should be back by 4/15 or sooner.  Dr. Delton Coombes recommends doing a bone marrow biopsy.  He will see you back after your bone marrow biopsy for a follow up.   Thank you for choosing [place] at [facility] to provide your oncology and hematology care.  To afford each patient quality time with our provider, please arrive at least 15 minutes before your scheduled appointment time.   If you have a lab appointment with the [facility] please come in thru the  Main Entrance and check in at the main information desk  You need to re-schedule your appointment should you arrive 10 or more minutes late.  We strive to give you quality time with our providers, and arriving late affects you and other patients whose appointments are after yours.  Also, if you no show three or more times for appointments you may be dismissed from the clinic at the providers discretion.     Again, thank you for choosing [facility].  Our hope is that these requests will decrease the amount of time that you wait before being seen by our physicians.       _____________________________________________________________  Should you have questions after your visit to [facility], please contact our office at [phone] between the hours of 8:00 a.m. and 4:30 p.m.  Voicemails left after 4:00 p.m. will not be returned until the following business day.  For prescription refill requests, have your pharmacy contact our office and allow 72 hours.    Cancer Center Support Programs:   > Cancer Support Group  2nd Tuesday of the month 1pm-2pm, Journey Room

## 2020-02-15 NOTE — Assessment & Plan Note (Signed)
1.  Severe leukocytosis, suggestive of CML: -Presented to the ER with shortness of breath and chest pressure started on 02/06/2020. -Found to have CBC with white count 123 and hemoglobin 9.3 and platelet count 186. -CBC in May 2020 was normal with a white count of 8.3. -CT of the chest PE protocol showed splenomegaly (AP diameter 14 cm).  No pulmonary embolism. -She denies any fevers, night sweats or weight loss in the last 6 months. -Flow cytometry on 02/08/2020 reviewed by me shows 2% myeloblasts. -BCR/ABL by FISH is pending. -Peripheral blood smear reviewed by me showed left-sided shift, consistent with CML.  Occasional blasts seen. -While we are waiting for BCR/ABL results, I have recommended bone marrow aspiration and biopsy.  I discussed the complications in detail. -She would like to have it done under sedation.  We will make referral to IR.  I will see her back after the biopsy.  2.  Tumor lysis: -Her uric acid was elevated at 9.7 and LDH of 903. -She was started on allopurinol 300 mg daily. -I have recommended checking uric acid and LDH today.  3.  Hypertension: -She is on losartan 50 mg daily.  Blood pressure today is 126/68.  4.  Normocytic anemia: -Hemoglobin on the day of discharge was 8.8.  She was taking iron tablet daily as outpatient. -We will repeat a CBC today to see if she needs a blood transfusion. -Anemia most likely from bone marrow infiltration from underlying leukemia.

## 2020-02-15 NOTE — Progress Notes (Signed)
CRITICAL VALUE ALERT  Critical Value:  WBC 140.2  Date & Time Notied:  02/15/2020 at 1435  Provider Notified: Dr. Delton Coombes  Orders Received/Actions taken: pt scheduled for bone marrow biopsy and f/u in clinic after procedure

## 2020-02-15 NOTE — Progress Notes (Signed)
Established Patient Office Visit  Subjective:  Patient ID: Marcia Hahn, female    DOB: Sep 07, 1976  Age: 44 y.o. MRN: [id number]  CC:  Chief Complaint  Patient presents with  . Hospitalization Follow-up    HPI Marcia Hahn presents for hospital admission 3/29-3/30/21-elevated WBC-123.3 -1 week ago, 115.6 verified.  H/H 8.8/27.6. LDH 664. Uric acid-8.4 Concern for leukemia diagnosis with appt today at cancer center for additional evaluation.    HTN-pt taking Losartan 50mg  daily-reduced while hospitalized. Pt has been taking spironolactone 12.5 since discharge. Pt no longer taking HCTZ.   SOB-pt has not been experiencing SOB/CP since discharge  Chronic anemia-pt taking iron TID previously -now BID  Elevated uric acid -allopurinol-pt has not started  Past Medical History:  Diagnosis Date  . Acne vulgaris   . Hypertension   . Mild obesity     Past Surgical History:  Procedure Laterality Date  . BREAST BIOPSY Left   . CESAREAN SECTION      Family History  Problem Relation Age of Onset  . GER disease Mother   . Diabetes Mother   . Hypertension Mother   . Heart attack Father 50  . Heart disease Father   . Migraines Sister   . Breast cancer Maternal Grandmother   . Stroke Maternal Grandmother   . Breast cancer Cousin   . Stroke Maternal Aunt     Social History   Socioeconomic History  . Marital status: Single    Spouse name: Not on file  . Number of children: 2  . Years of education: Not on file  . Highest education level: Not on file  Occupational History  . Occupation: Psychologist, educational   Tobacco Use  . Smoking status: Former Smoker    Types: Cigarettes    Quit date: 12/04/2012    Years since quitting: 7.2  . Smokeless tobacco: Never Used  Substance and Sexual Activity  . Alcohol use: Yes    Comment: occasional  . Drug use: No  . Sexual activity: Yes    Birth control/protection: I.U.D.  Other Topics Concern  . Not on file  Social History  Narrative   Mother of twins    Social Determinants of Health   Financial Resource Strain:   . Difficulty of Paying Living Expenses:   Food Insecurity:   . Worried About Charity fundraiser in the Last Year:   . Arboriculturist in the Last Year:   Transportation Needs:   . Film/video editor (Medical):   Marland Kitchen Lack of Transportation (Non-Medical):   Physical Activity:   . Days of Exercise per Week:   . Minutes of Exercise per Session:   Stress:   . Feeling of Stress :   Social Connections:   . Frequency of Communication with Friends and Family:   . Frequency of Social Gatherings with Friends and Family:   . Attends Religious Services:   . Active Member of Clubs or Organizations:   . Attends Archivist Meetings:   Marland Kitchen Marital Status:   Intimate Partner Violence:   . Fear of Current or Ex-Partner:   . Emotionally Abused:   Marland Kitchen Physically Abused:   . Sexually Abused:     Outpatient Medications Prior to Visit  Medication Sig Dispense Refill  . ergocalciferol (VITAMIN D2) 1.25 MG (50000 UT) capsule Take 1 capsule (50,000 Units total) by mouth once a week. One capsule once weekly 12 capsule 2  . ferrous sulfate (FEROSUL) 325 (65 FE)  MG tablet Take 1 tablet (325 mg total) by mouth 2 (two) times daily with a meal. 60 tablet 3  . losartan (COZAAR) 50 MG tablet Take 1 tablet (50 mg total) by mouth daily. 90 tablet 3  . Multiple Vitamin (MULTIVITAMIN WITH MINERALS) TABS tablet Take 1 tablet by mouth daily. 30 tablet 3  . spironolactone (ALDACTONE) 25 MG tablet Take 0.5 tablets (12.5 mg total) by mouth daily. 45 tablet 3   No facility-administered medications prior to visit.    Allergies  Allergen Reactions  . Amlodipine     Headache   . Tomato     Processed tomatoes in the can.     ROS Review of Systems  Constitutional: Negative.   HENT: Positive for congestion.   Eyes: Negative.        Swollen optic nerve-eye exam 2/21-eye exam recommended for follow up-Dr. Moshe Cipro  office completing referral  Respiratory: Positive for shortness of breath.        SOB resolved  Cardiovascular: Positive for chest pain.       Chest pain resolved  Gastrointestinal: Negative.   Endocrine: Negative.   Genitourinary: Negative.   Musculoskeletal: Negative.   Allergic/Immunologic: Negative.   Neurological: Negative.   Hematological:       Leukocytosis  Psychiatric/Behavioral: Negative.       Objective:    Physical Exam  BP 131/83 (BP Location: Left Arm, Patient Position: Sitting)   Pulse 87   Temp (!) 97.5 F (36.4 C) (Temporal)   Wt 208 lb (94.3 kg)   LMP 02/01/2020   SpO2 97%   BMI 33.57 kg/m  Wt Readings from Last 3 Encounters:  02/15/20 208 lb (94.3 kg)  12/11/19 210 lb (95.3 kg)  11/04/19 212 lb (96.2 kg)     Health Maintenance Due  Topic Date Due  . TETANUS/TDAP  10/05/2019    Lab Results  Component Value Date   TSH 1.17 09/16/2018   Lab Results  Component Value Date   WBC 115.6 (HH) 02/09/2020   HGB 8.8 (L) 02/09/2020   HCT 27.6 (L) 02/09/2020   MCV 96.5 02/09/2020   PLT 179 02/09/2020   Lab Results  Component Value Date   NA 140 02/09/2020   K 3.4 (L) 02/09/2020   CO2 24 02/09/2020   GLUCOSE 94 02/09/2020   BUN 12 02/09/2020   CREATININE 0.97 02/09/2020   BILITOT 0.4 10/07/2019   ALKPHOS 50 06/11/2017   AST 21 10/07/2019   ALT 19 10/07/2019   PROT 6.6 10/07/2019   ALBUMIN 4.1 06/11/2017   CALCIUM 8.6 (L) 02/09/2020   ANIONGAP 8 02/09/2020   Lab Results  Component Value Date   CHOL 145 10/07/2019   Lab Results  Component Value Date   HDL 34 (L) 10/07/2019   Lab Results  Component Value Date   LDLCALC 85 10/07/2019   Lab Results  Component Value Date   TRIG 165 (H) 10/07/2019   Lab Results  Component Value Date   CHOLHDL 4.3 10/07/2019   Lab Results  Component Value Date   HGBA1C 5.5 04/01/2019      Assessment & Plan:  1. Benign essential HTN Losartan 50mg  and spirolactone 12.5mg  -HTN stable, no  concerns  2. Iron deficiency anemia due to chronic blood loss Pt continuing to take iron BID-evaluation today at hematology 3. Leukocytosis, unspecified type Pt with appointment today at hematoloyg  Follow-up: hematology today, needs eye appt   Marcia Karge Hannah Beat, MD

## 2020-02-15 NOTE — Progress Notes (Signed)
[place], [place] [address]   CLINIC:  Medical Oncology/Hematology  PCP:  Fayrene Helper, [place], [place] [id number] [phone]   REASON FOR VISIT:  Follow-up for leukocytosis.  CURRENT THERAPY: Under work-up.   INTERVAL HISTORY:  Marcia Hahn 44 y.o. female seen for follow-up of leukocytosis.  She was discharged home on 02/09/2020.  She presented to the hospital with chest pressure and shortness of breath.  She reports that the symptoms have improved over the weekend.  Denies any lightheadedness.  Reports mild ankle swelling.    REVIEW OF SYSTEMS:  Review of Systems  Cardiovascular: Positive for leg swelling.  All other systems reviewed and are negative.    PAST MEDICAL/SURGICAL HISTORY:  Past Medical History:  Diagnosis Date  . Acne vulgaris   . Hypertension   . Mild obesity    Past Surgical History:  Procedure Laterality Date  . BREAST BIOPSY Left   . CESAREAN SECTION       SOCIAL HISTORY:  Social History   Socioeconomic History  . Marital status: Single    Spouse name: Not on file  . Number of children: 2  . Years of education: Not on file  . Highest education level: Not on file  Occupational History  . Occupation: Psychologist, educational   Tobacco Use  . Smoking status: Former Smoker    Types: Cigarettes    Quit date: 12/04/2012    Years since quitting: 7.2  . Smokeless tobacco: Never Used  Substance and Sexual Activity  . Alcohol use: Yes    Comment: occasional  . Drug use: No  . Sexual activity: Yes    Birth control/protection: I.U.D.  Other Topics Concern  . Not on file  Social History Narrative   Mother of twins    Social Determinants of Health   Financial Resource Strain:   . Difficulty of Paying Living Expenses:   Food Insecurity:   . Worried About Charity fundraiser in the Last Year:   . Arboriculturist in the Last Year:   Transportation Needs:   . Lexicographer (Medical):   Marland Kitchen Lack of Transportation (Non-Medical):   Physical Activity:   . Days of Exercise per Week:   . Minutes of Exercise per Session:   Stress:   . Feeling of Stress :   Social Connections:   . Frequency of Communication with Friends and Family:   . Frequency of Social Gatherings with Friends and Family:   . Attends Religious Services:   . Active Member of Clubs or Organizations:   . Attends Archivist Meetings:   Marland Kitchen Marital Status:   Intimate Partner Violence:   . Fear of Current or Ex-Partner:   . Emotionally Abused:   Marland Kitchen Physically Abused:   . Sexually Abused:     FAMILY HISTORY:  Family History  Problem Relation Age of Onset  . GER disease Mother   . Diabetes Mother   . Hypertension Mother   . Heart attack Father 75  . Heart disease Father   . Migraines Sister   . Breast cancer Maternal Grandmother   . Stroke Maternal Grandmother   . Breast cancer Cousin   . Stroke Maternal Aunt     CURRENT MEDICATIONS:  Outpatient Encounter Medications as of 02/15/2020  Medication Sig Note  . allopurinol (ZYLOPRIM) 300 MG tablet Take 300 mg by mouth daily.   . ergocalciferol (VITAMIN D2)  1.25 MG (50000 UT) capsule Take 1 capsule (50,000 Units total) by mouth once a week. One capsule once weekly 02/08/2020: Patient takes on Sundays  . ferrous sulfate (FEROSUL) 325 (65 FE) MG tablet Take 1 tablet (325 mg total) by mouth 2 (two) times daily with a meal.   . losartan (COZAAR) 50 MG tablet Take 1 tablet (50 mg total) by mouth daily.   . Multiple Vitamin (MULTIVITAMIN WITH MINERALS) TABS tablet Take 1 tablet by mouth daily.   Marland Kitchen spironolactone (ALDACTONE) 25 MG tablet Take 0.5 tablets (12.5 mg total) by mouth daily.    No facility-administered encounter medications on file as of 02/15/2020.    ALLERGIES:  Allergies  Allergen Reactions  . Amlodipine     Headache   . Tomato     Processed tomatoes in the can.      PHYSICAL EXAM:  ECOG Performance  status: 0  Vitals:   02/15/20 1109  BP: 126/68  Pulse: 86  Resp: 20  Temp: 97.7 F (36.5 C)  SpO2: 100%   Filed Weights   02/15/20 1109  Weight: 208 lb 3.2 oz (94.4 kg)    Physical Exam Vitals reviewed.  Constitutional:      Appearance: Normal appearance.  Cardiovascular:     Rate and Rhythm: Normal rate and regular rhythm.     Heart sounds: Normal heart sounds.  Pulmonary:     Effort: Pulmonary effort is normal.     Breath sounds: Normal breath sounds.  Abdominal:     General: There is no distension.     Palpations: Abdomen is soft. There is no mass.  Lymphadenopathy:     Cervical: No cervical adenopathy.  Skin:    General: Skin is warm.  Neurological:     General: No focal deficit present.     Mental Status: She is alert and oriented to person, place, and time.  Psychiatric:        Mood and Affect: Mood normal.        Behavior: Behavior normal.      LABORATORY DATA:  I have reviewed the labs as listed.  CBC    Component Value Date/Time   WBC 115.6 (HH) 02/09/2020 0558   RBC 2.86 (L) 02/09/2020 0558   HGB 8.8 (L) 02/09/2020 0558   HCT 27.6 (L) 02/09/2020 0558   PLT 179 02/09/2020 0558   MCV 96.5 02/09/2020 0558   MCH 30.8 02/09/2020 0558   MCHC 31.9 02/09/2020 0558   RDW 16.7 (H) 02/09/2020 0558   LYMPHSABS 2.5 02/08/2020 1002   MONOABS 6.2 (H) 02/08/2020 1002   EOSABS 3.7 (H) 02/08/2020 1002   BASOSABS 1.2 (H) 02/08/2020 1002   CMP Latest Ref Rng & Units 02/09/2020 02/08/2020 12/09/2019  Glucose 70 - 99 mg/dL 94 92 89  BUN 6 - 20 mg/dL '12 15 20  ' Creatinine 0.44 - 1.00 mg/dL 0.97 1.04(H) 1.12(H)  Sodium 135 - 145 mmol/L 140 140 139  Potassium 3.5 - 5.1 mmol/L 3.4(L) 3.4(L) 3.3(L)  Chloride 98 - 111 mmol/L 108 104 104  CO2 22 - 32 mmol/L '24 27 25  ' Calcium 8.9 - 10.3 mg/dL 8.6(L) 9.3 9.0  Total Protein 6.1 - 8.1 g/dL - - -  Total Bilirubin 0.2 - 1.2 mg/dL - - -  Alkaline Phos 33 - 115 U/L - - -  AST 10 - 30 U/L - - -  ALT 6 - 29 U/L - - -        DIAGNOSTIC IMAGING:  I have  independently reviewed the scans and discussed with the patient.     ASSESSMENT & PLAN:   Leukocytosis---??? CML 1.  Severe leukocytosis, suggestive of CML: -Presented to the ER with shortness of breath and chest pressure started on 02/06/2020. -Found to have CBC with white count 123 and hemoglobin 9.3 and platelet count 186. -CBC in May 2020 was normal with a white count of 8.3. -CT of the chest PE protocol showed splenomegaly (AP diameter 14 cm).  No pulmonary embolism. -She denies any fevers, night sweats or weight loss in the last 6 months. -Flow cytometry on 02/08/2020 reviewed by me shows 2% myeloblasts. -BCR/ABL by FISH is pending. -Peripheral blood smear reviewed by me showed left-sided shift, consistent with CML.  Occasional blasts seen. -While we are waiting for BCR/ABL results, I have recommended bone marrow aspiration and biopsy.  I discussed the complications in detail. -She would like to have it done under sedation.  We will make referral to IR.  I will see her back after the biopsy.  2.  Tumor lysis: -Her uric acid was elevated at 9.7 and LDH of 903. -She was started on allopurinol 300 mg daily. -I have recommended checking uric acid and LDH today.  3.  Hypertension: -She is on losartan 50 mg daily.  Blood pressure today is 126/68.  4.  Normocytic anemia: -Hemoglobin on the day of discharge was 8.8.  She was taking iron tablet daily as outpatient. -We will repeat a CBC today to see if she needs a blood transfusion. -Anemia most likely from bone marrow infiltration from underlying leukemia.      Orders placed this encounter:  Orders Placed This Encounter  Procedures  . CT BONE MARROW BIOPSY & ASPIRATION  . CBC with Differential  . Lactate dehydrogenase  . Uric acid  . Type and screen      Derek Jack, MD [place] [phone]

## 2020-02-15 NOTE — Patient Instructions (Signed)
See hematology today

## 2020-02-18 LAB — FISH HES LEUKEMIA, 4Q12 REA

## 2020-02-21 ENCOUNTER — Other Ambulatory Visit: Payer: Self-pay | Admitting: Radiology

## 2020-02-23 ENCOUNTER — Other Ambulatory Visit: Payer: Self-pay

## 2020-02-23 ENCOUNTER — Ambulatory Visit (HOSPITAL_COMMUNITY)
Admission: RE | Admit: 2020-02-23 | Discharge: 2020-02-23 | Disposition: A | Discharge status: Home / Self Care | Payer: BC Managed Care – PPO | Source: Ambulatory Visit | Attending: Hematology | Admitting: Hematology

## 2020-02-23 ENCOUNTER — Encounter (HOSPITAL_COMMUNITY): Payer: Self-pay

## 2020-02-23 DIAGNOSIS — D72829 Elevated white blood cell count, unspecified: Secondary | ICD-10-CM

## 2020-02-23 DIAGNOSIS — D72819 Decreased white blood cell count, unspecified: Secondary | ICD-10-CM | POA: Diagnosis not present

## 2020-02-23 DIAGNOSIS — D7589 Other specified diseases of blood and blood-forming organs: Secondary | ICD-10-CM | POA: Diagnosis not present

## 2020-02-23 LAB — BCR-ABL1 FISH
Cells Analyzed: 100
Cells Counted: 100

## 2020-02-23 LAB — CBC WITH DIFFERENTIAL/PLATELET
Abs Immature Granulocytes: 40.42 10*3/uL — ABNORMAL HIGH (ref 0.00–0.07)
Basophils Absolute: 7.3 10*3/uL — ABNORMAL HIGH (ref 0.0–0.1)
Basophils Relative: 5 %
Eosinophils Absolute: 2.9 10*3/uL — ABNORMAL HIGH (ref 0.0–0.5)
Eosinophils Relative: 2 %
HCT: 30.2 % — ABNORMAL LOW (ref 36.0–46.0)
Hemoglobin: 9.4 g/dL — ABNORMAL LOW (ref 12.0–15.0)
Immature Granulocytes: 26 %
Lymphocytes Relative: 8 %
Lymphs Abs: 12.3 10*3/uL — ABNORMAL HIGH (ref 0.7–4.0)
MCH: 30 pg (ref 26.0–34.0)
MCHC: 31.1 g/dL (ref 30.0–36.0)
MCV: 96.5 fL (ref 80.0–100.0)
Monocytes Absolute: 3.8 10*3/uL — ABNORMAL HIGH (ref 0.1–1.0)
Monocytes Relative: 3 %
Neutro Abs: 87.7 10*3/uL — ABNORMAL HIGH (ref 1.7–7.7)
Neutrophils Relative %: 56 %
Platelets: 159 10*3/uL (ref 150–400)
RBC: 3.13 MIL/uL — ABNORMAL LOW (ref 3.87–5.11)
RDW: 17.2 % — ABNORMAL HIGH (ref 11.5–15.5)
WBC Morphology: INCREASED
WBC: 154.5 10*3/uL (ref 4.0–10.5)
nRBC: 1.2 % — ABNORMAL HIGH (ref 0.0–0.2)

## 2020-02-23 LAB — PROTIME-INR
INR: 1 (ref 0.8–1.2)
Prothrombin Time: 13.4 seconds (ref 11.4–15.2)

## 2020-02-23 MED ORDER — FENTANYL CITRATE (PF) 100 MCG/2ML IJ SOLN
INTRAMUSCULAR | Status: AC
  Filled 2020-02-23: qty 4

## 2020-02-23 MED ORDER — MIDAZOLAM HCL 2 MG/2ML IJ SOLN
INTRAMUSCULAR | Status: AC
  Filled 2020-02-23: qty 4

## 2020-02-23 MED ORDER — NALOXONE HCL 0.4 MG/ML IJ SOLN
INTRAMUSCULAR | Status: AC
  Filled 2020-02-23: qty 1

## 2020-02-23 MED ORDER — LIDOCAINE HCL (PF) 1 % IJ SOLN
INTRAMUSCULAR | Status: AC | PRN
  Administered 2020-02-23: 10 mL via INTRADERMAL

## 2020-02-23 MED ORDER — FENTANYL CITRATE (PF) 100 MCG/2ML IJ SOLN
INTRAMUSCULAR | Status: AC | PRN
  Administered 2020-02-23: 50 ug via INTRAVENOUS

## 2020-02-23 MED ORDER — SODIUM CHLORIDE 0.9 % IV SOLN: INTRAVENOUS | Status: DC

## 2020-02-23 MED ORDER — MIDAZOLAM HCL 2 MG/2ML IJ SOLN
INTRAMUSCULAR | Status: AC | PRN
  Administered 2020-02-23: 1 mg via INTRAVENOUS

## 2020-02-23 MED ORDER — MIDAZOLAM HCL 2 MG/2ML IJ SOLN
INTRAMUSCULAR | Status: AC | PRN
  Administered 2020-02-23 (×3): 1 mg via INTRAVENOUS

## 2020-02-23 MED ORDER — FLUMAZENIL 0.5 MG/5ML IV SOLN
INTRAVENOUS | Status: AC
  Filled 2020-02-23: qty 5

## 2020-02-23 NOTE — Sedation Documentation (Signed)
CRITICAL VALUE ALERT  Critical Value:  WBC 154.5  Date & Time Notied:  1126  Provider Notified: Dr Corrie Mckusick  Orders Received/Actions taken: No new orders

## 2020-02-23 NOTE — H&P (Signed)
Chief Complaint: Patient was seen in consultation today for bone marrow biopsy.  Referring Physician(s): Katragadda,Sreedhar  Supervising Physician: Corrie Mckusick  Patient Status: [facility] - Out-pt  History of Present Illness: Marcia Hahn is a 44 y.o. female with a past medical history significant for HTN and leukocytosis of unknown etiology followed by Dr. Delton Coombes who presents today for a bone marrow biopsy. Marcia Hahn presented to the ED on 02/08/20 with complaints of dyspnea and chest pain. She was found to have WBC of 123.3 (previously 8.4 about a year prior), hgb 9.3, plt 186. She was admitted for further workup. Hematology/oncology was consulted who recommended outpatient IR bone marrow biopsy for further work up.  Marcia Hahn denies any complaints today, she has not had any further chest pain or shortness of breath since her hospital stay. She understands the requested procedure today and wishes to proceed as planned.  Past Medical History:  Diagnosis Date  . Acne vulgaris   . Hypertension   . Mild obesity     Past Surgical History:  Procedure Laterality Date  . BREAST BIOPSY Left   . CESAREAN SECTION      Allergies: Amlodipine and Tomato  Medications: Prior to Admission medications   Medication Sig Start Date End Date Taking? Authorizing Provider  allopurinol (ZYLOPRIM) 300 MG tablet Take 300 mg by mouth daily. 02/09/20   [provider]  ergocalciferol (VITAMIN D2) 1.25 MG (50000 UT) capsule Take 1 capsule (50,000 Units total) by mouth once a week. One capsule once weekly 04/15/19   Fayrene Helper, MD  ferrous sulfate (FEROSUL) 325 (65 FE) MG tablet Take 1 tablet (325 mg total) by mouth 2 (two) times daily with a meal. 02/09/20   Emokpae, Courage, MD  losartan (COZAAR) 50 MG tablet Take 1 tablet (50 mg total) by mouth daily. 02/11/20   Roxan Hockey, MD  Multiple Vitamin (MULTIVITAMIN WITH MINERALS) TABS tablet Take 1 tablet by mouth daily. 02/09/20    Roxan Hockey, MD  spironolactone (ALDACTONE) 25 MG tablet Take 0.5 tablets (12.5 mg total) by mouth daily. 02/11/20 05/11/21  Roxan Hockey, MD     Family History  Problem Relation Age of Onset  . GER disease Mother   . Diabetes Mother   . Hypertension Mother   . Heart attack Father 51  . Heart disease Father   . Migraines Sister   . Breast cancer Maternal Grandmother   . Stroke Maternal Grandmother   . Breast cancer Cousin   . Stroke Maternal Aunt     Social History   Socioeconomic History  . Marital status: Single    Spouse name: Not on file  . Number of children: 2  . Years of education: Not on file  . Highest education level: Not on file  Occupational History  . Occupation: Psychologist, educational   Tobacco Use  . Smoking status: Former Smoker    Types: Cigarettes    Quit date: 12/04/2012    Years since quitting: 7.2  . Smokeless tobacco: Never Used  Substance and Sexual Activity  . Alcohol use: Yes    Comment: occasional  . Drug use: No  . Sexual activity: Yes    Birth control/protection: I.U.D.  Other Topics Concern  . Not on file  Social History Narrative   Mother of twins    Social Determinants of Health   Financial Resource Strain:   . Difficulty of Paying Living Expenses:   Food Insecurity:   . Worried About Charity fundraiser  in the Last Year:   . [place] in the Last Year:   Transportation Needs:   . Film/video editor (Medical):   Marland Kitchen Lack of Transportation (Non-Medical):   Physical Activity:   . Days of Exercise per Week:   . Minutes of Exercise per Session:   Stress:   . Feeling of Stress :   Social Connections:   . Frequency of Communication with Friends and Family:   . Frequency of Social Gatherings with Friends and Family:   . Attends Religious Services:   . Active Member of Clubs or Organizations:   . Attends Archivist Meetings:   Marland Kitchen Marital Status:      Review of Systems: A 12 point ROS discussed and pertinent  positives are indicated in the HPI above.  All other systems are negative.  Review of Systems  Constitutional: Negative for chills and fever.  HENT: Negative for nosebleeds.   Respiratory: Negative for cough and shortness of breath.   Cardiovascular: Negative for chest pain.  Gastrointestinal: Negative for abdominal pain, blood in stool, diarrhea, nausea and vomiting.  Genitourinary: Negative for hematuria.  Musculoskeletal: Negative for back pain.  Neurological: Negative for dizziness and headaches.    Vital Signs: Ht '5\' 7"'  (1.702 m)   Wt 204 lb (92.5 kg)   LMP 02/01/2020   BMI 31.95 kg/m   Physical Exam Vitals reviewed.  Constitutional:      General: She is not in acute distress. HENT:     Head: Normocephalic.     Mouth/Throat:     Mouth: Mucous membranes are moist.     Pharynx: Oropharynx is clear. No oropharyngeal exudate or posterior oropharyngeal erythema.  Cardiovascular:     Rate and Rhythm: Normal rate and regular rhythm.  Pulmonary:     Effort: Pulmonary effort is normal.     Breath sounds: Normal breath sounds.  Abdominal:     General: There is no distension.     Palpations: Abdomen is soft.     Tenderness: There is no abdominal tenderness.  Skin:    General: Skin is warm and dry.  Neurological:     Mental Status: She is alert and oriented to person, place, and time.  Psychiatric:        Mood and Affect: Mood normal.        Behavior: Behavior normal.        Thought Content: Thought content normal.        Judgment: Judgment normal.      MD Evaluation Airway: WNL Heart: WNL Abdomen: WNL Chest/ Lungs: WNL ASA  Classification: 2 Mallampati/Airway Score: Two   Imaging: CT ANGIO CHEST PE W OR WO CONTRAST  Result Date: 02/08/2020 CLINICAL DATA:  Central chest pain and shortness of breath for 2 days, pain radiates to left side neck, hypertension EXAM: CT ANGIOGRAPHY CHEST WITH CONTRAST TECHNIQUE: Multidetector CT imaging of the chest was performed  using the standard protocol during bolus administration of intravenous contrast. Multiplanar CT image reconstructions and MIPs were obtained to evaluate the vascular anatomy. CONTRAST:  127m OMNIPAQUE IOHEXOL 350 MG/ML SOLN COMPARISON:  02/08/2020 FINDINGS: Cardiovascular: This is a technically adequate evaluation of the pulmonary vasculature. No filling defects or pulmonary emboli. Heart is unremarkable without pericardial effusion. Thoracic aorta is normal. Mediastinum/Nodes: No enlarged mediastinal, hilar, or axillary lymph nodes. Thyroid gland, trachea, and esophagus demonstrate no significant findings. Lungs/Pleura: No airspace disease, effusion, or pneumothorax. Central airways are widely patent. Upper Abdomen: Numerous hepatic cysts  are identified. Mild splenic enlargement measuring 14 cm in anterior-posterior dimension. This is incompletely imaged on this study. Musculoskeletal: No acute or destructive bony lesions. Reconstructed images demonstrate no additional findings. Review of the MIP images confirms the above findings. IMPRESSION: 1. No evidence of pulmonary embolus. 2. No acute intrathoracic process. 3. Splenomegaly. Electronically Signed   By: Randa Ngo M.D.   On: 02/08/2020 18:41   DG Chest Portable 1 View  Result Date: 02/08/2020 CLINICAL DATA:  Chest pain EXAM: PORTABLE CHEST 1 VIEW COMPARISON:  January 25, 2015 FINDINGS: The lungs are clear. Heart is upper normal in size with pulmonary vascularity normal. No adenopathy. No pneumothorax. No bone lesions. IMPRESSION: Lungs clear.  Heart upper normal in size. Electronically Signed   By: Lowella Grip III M.D.   On: 02/08/2020 09:59    Labs:  CBC: Recent Labs    04/01/19 0901 02/08/20 1002 02/09/20 0558 02/15/20 1330  WBC 8.4 123.3* 115.6* 140.2*  HGB 11.3* 9.3* 8.8* 8.8*  HCT 34.2* 29.2* 27.6* 27.9*  PLT 332 186 179 188    COAGS: No results for input(s): INR, APTT in the last 8760 hours.  BMP: Recent Labs     04/01/19 0901 04/01/19 0901 10/07/19 1021 12/09/19 0738 02/08/20 1002 02/09/20 0558  NA 141   < > 141 139 140 140  K 3.6   < > 3.4* 3.3* 3.4* 3.4*  CL 105   < > 102 104 104 108  CO2 30   < > '29 25 27 24  ' GLUCOSE 91   < > 88 89 92 94  BUN 12   < > '20 20 15 12  ' CALCIUM 8.6   < > 9.0 9.0 9.3 8.6*  CREATININE 0.89   < > 1.18* 1.12* 1.04* 0.97  GFRNONAA 79  --  56*  --  >60 >60  GFRAA 92  --  65  --  >60 >60   < > = values in this interval not displayed.    LIVER FUNCTION TESTS: Recent Labs    10/07/19 1021  BILITOT 0.4  AST 21  ALT 19  PROT 6.6    TUMOR MARKERS: No results for input(s): AFPTM, CEA, CA199, CHROMGRNA in the last 8760 hours.  Assessment and Plan:  44 y/o F with severe leukocytosis of unknown etiology concerning for CML followed by Dr. Delton Coombes who presents today for bone marrow biopsy.  Patient has been NPO since midnight, she does not take blood thinning medications. Afebrile, pre-procedure labs pending and will be reviewed prior to proceeding.  Risks and benefits of bone marrow biopsy was discussed with the patient and/or patient's family including, but not limited to bleeding, infection, damage to adjacent structures or low yield requiring additional tests.  All of the questions were answered and there is agreement to proceed.  Consent signed and in chart.   Thank you for this interesting consult.  I greatly enjoyed meeting Marcia Hahn and look forward to participating in their care.  A copy of this report was sent to the requesting provider on this date.  Electronically Signed: Joaquim Nam, PA-C 02/23/2020, 9:46 AM   I spent a total of  30 Minutes  in face to face in clinical consultation, greater than 50% of which was counseling/coordinating care for bone marrow biopsy.

## 2020-02-23 NOTE — Procedures (Signed)
Interventional Radiology Procedure Note  Procedure: CT guided aspirate and core biopsy of right posterior iliac bone Complications: None Recommendations: - Bedrest supine x 1 hrs - OTC's PRN  Pain - Follow biopsy results  Signed,  Ajanae Virag S. Stassi Fadely, DO    

## 2020-02-23 NOTE — Progress Notes (Signed)
Marcia Hahn was seen for IR procedure on 02/23/2020.   She may return to work on 02/24/20.

## 2020-02-23 NOTE — Discharge Instructions (Signed)
Bone Marrow Aspiration and Bone Marrow Biopsy, Adult, Care After This sheet gives you information about how to care for yourself after your procedure. Your health care provider may also give you more specific instructions. If you have problems or questions, contact your health care provider. What can I expect after the procedure? After the procedure, it is common to have:  Mild pain and tenderness.  Swelling.  Bruising. Follow these instructions at home: Puncture site care   Follow instructions from your health care provider about how to take care of the puncture site. Make sure you: ? Wash your hands with soap and water before and after you change your bandage (dressing). If soap and water are not available, use hand sanitizer. ? Change your dressing as told by your health care provider.  Check your puncture site every day for signs of infection. Check for: ? More redness, swelling, or pain. ? Fluid or blood. ? Warmth. ? Pus or a bad smell. Activity  Return to your normal activities as told by your health care provider. Ask your health care provider what activities are safe for you.  Do not lift anything that is heavier than 10 lb (4.5 kg), or the limit that you are told, until your health care provider says that it is safe.  Do not drive for 24 hours if you were given a sedative during your procedure. General instructions   Take over-the-counter and prescription medicines only as told by your health care provider.  Do not take baths, swim, or use a hot tub until your health care provider approves. Ask your health care provider if you may take showers. You may only be allowed to take sponge baths.  If directed, put ice on the affected area. To do this: ? Put ice in a plastic bag. ? Place a towel between your skin and the bag. ? Leave the ice on for 20 minutes, 2-3 times a day.  Keep all follow-up visits as told by your health care provider. This is important. Contact a  health care provider if:  Your pain is not controlled with medicine.  You have a fever.  You have more redness, swelling, or pain around the puncture site.  You have fluid or blood coming from the puncture site.  Your puncture site feels warm to the touch.  You have pus or a bad smell coming from the puncture site. Summary  After the procedure, it is common to have mild pain, tenderness, swelling, and bruising.  Follow instructions from your health care provider about how to take care of the puncture site and what activities are safe for you.  Take over-the-counter and prescription medicines only as told by your health care provider.  Contact a health care provider if you have any signs of infection, such as fluid or blood coming from the puncture site. This information is not intended to replace advice given to you by your health care provider. Make sure you discuss any questions you have with your health care provider. Document Revised: 03/17/2019 Document Reviewed: 03/17/2019 Elsevier Patient Education  2020 Else[employer] Inc. Moderate Conscious Sedation, Adult, Care After These instructions provide you with information about caring for yourself after your procedure. Your health care provider may also give you more specific instructions. Your treatment has been planned according to current medical practices, but problems sometimes occur. Call your health care provider if you have any problems or questions after your procedure. What can I expect after the procedure? After your procedure,   it is common:  To feel sleepy for several hours.  To feel clumsy and have poor balance for several hours.  To have poor judgment for several hours.  To vomit if you eat too soon. Follow these instructions at home: For at least 24 hours after the procedure:   Do not: ? Participate in activities where you could fall or become injured. ? Drive. ? Use heavy machinery. ? Drink alcohol. ? Take  sleeping pills or medicines that cause drowsiness. ? Make important decisions or sign legal documents. ? Take care of children on your own.  Rest. Eating and drinking  Follow the diet recommended by your health care provider.  If you vomit: ? Drink water, juice, or soup when you can drink without vomiting. ? Make sure you have little or no nausea before eating solid foods. General instructions  Have a responsible adult stay with you until you are awake and alert.  Take over-the-counter and prescription medicines only as told by your health care provider.  If you smoke, do not smoke without supervision.  Keep all follow-up visits as told by your health care provider. This is important. Contact a health care provider if:  You keep feeling nauseous or you keep vomiting.  You feel light-headed.  You develop a rash.  You have a fever. Get help right away if:  You have trouble breathing. This information is not intended to replace advice given to you by your health care provider. Make sure you discuss any questions you have with your health care provider. Document Revised: 10/11/2017 Document Reviewed: 02/18/2016 Elsevier Patient Education  2020 Elsevier Inc.  

## 2020-02-24 LAB — SURGICAL PATHOLOGY

## 2020-03-01 ENCOUNTER — Encounter (HOSPITAL_COMMUNITY): Payer: Self-pay | Admitting: Hematology

## 2020-03-08 ENCOUNTER — Other Ambulatory Visit (HOSPITAL_COMMUNITY): Payer: BC Managed Care – PPO

## 2020-03-08 ENCOUNTER — Other Ambulatory Visit: Payer: Self-pay

## 2020-03-08 ENCOUNTER — Inpatient Hospital Stay (HOSPITAL_BASED_OUTPATIENT_CLINIC_OR_DEPARTMENT_OTHER): Payer: BC Managed Care – PPO | Admitting: Hematology

## 2020-03-08 ENCOUNTER — Encounter (HOSPITAL_COMMUNITY): Payer: Self-pay | Admitting: Hematology

## 2020-03-08 VITALS — BP 130/75 | HR 100 | Temp 97.5°F | Resp 18 | Wt 203.5 lb

## 2020-03-08 DIAGNOSIS — E883 Tumor lysis syndrome: Secondary | ICD-10-CM | POA: Diagnosis not present

## 2020-03-08 DIAGNOSIS — I10 Essential (primary) hypertension: Secondary | ICD-10-CM | POA: Diagnosis not present

## 2020-03-08 DIAGNOSIS — Z87891 Personal history of nicotine dependence: Secondary | ICD-10-CM | POA: Diagnosis not present

## 2020-03-08 DIAGNOSIS — C9210 Chronic myeloid leukemia, BCR/ABL-positive, not having achieved remission: Secondary | ICD-10-CM | POA: Insufficient documentation

## 2020-03-08 DIAGNOSIS — D649 Anemia, unspecified: Secondary | ICD-10-CM | POA: Diagnosis not present

## 2020-03-08 DIAGNOSIS — D72829 Elevated white blood cell count, unspecified: Secondary | ICD-10-CM | POA: Diagnosis not present

## 2020-03-08 LAB — COMPREHENSIVE METABOLIC PANEL
ALT: 31 U/L (ref 0–44)
AST: 31 U/L (ref 15–41)
Albumin: 3.9 g/dL (ref 3.5–5.0)
Alkaline Phosphatase: 47 U/L (ref 38–126)
Anion gap: 7 (ref 5–15)
BUN: 13 mg/dL (ref 6–20)
CO2: 24 mmol/L (ref 22–32)
Calcium: 8.5 mg/dL — ABNORMAL LOW (ref 8.9–10.3)
Chloride: 107 mmol/L (ref 98–111)
Creatinine, Ser: 0.86 mg/dL (ref 0.44–1.00)
GFR calc Af Amer: >60 mL/min (ref >60)
GFR calc non Af Amer: >60 mL/min (ref >60)
Glucose, Bld: 109 mg/dL — ABNORMAL HIGH (ref 70–99)
Potassium: 3.3 mmol/L — ABNORMAL LOW (ref 3.5–5.1)
Sodium: 138 mmol/L (ref 135–145)
Total Bilirubin: 0.6 mg/dL (ref 0.3–1.2)
Total Protein: 6.8 g/dL (ref 6.5–8.1)

## 2020-03-08 LAB — CBC WITH DIFFERENTIAL/PLATELET
Abs Immature Granulocytes: 36.07 10*3/uL — ABNORMAL HIGH (ref 0.00–0.07)
Basophils Absolute: 5.1 10*3/uL — ABNORMAL HIGH (ref 0.0–0.1)
Basophils Relative: 4 %
Eosinophils Absolute: 2.3 10*3/uL — ABNORMAL HIGH (ref 0.0–0.5)
Eosinophils Relative: 2 %
HCT: 25.9 % — ABNORMAL LOW (ref 36.0–46.0)
Hemoglobin: 8 g/dL — ABNORMAL LOW (ref 12.0–15.0)
Immature Granulocytes: 28 %
Lymphocytes Relative: 8 %
Lymphs Abs: 10.7 10*3/uL — ABNORMAL HIGH (ref 0.7–4.0)
MCH: 30 pg (ref 26.0–34.0)
MCHC: 30.9 g/dL (ref 30.0–36.0)
MCV: 97 fL (ref 80.0–100.0)
Monocytes Absolute: 3.8 10*3/uL — ABNORMAL HIGH (ref 0.1–1.0)
Monocytes Relative: 3 %
Neutro Abs: 69.5 10*3/uL — ABNORMAL HIGH (ref 1.7–7.7)
Neutrophils Relative %: 55 %
Platelets: 185 10*3/uL (ref 150–400)
RBC: 2.67 MIL/uL — ABNORMAL LOW (ref 3.87–5.11)
RDW: 17.3 % — ABNORMAL HIGH (ref 11.5–15.5)
WBC Morphology: INCREASED
WBC: 127.5 10*3/uL (ref 4.0–10.5)
nRBC: 0.9 % — ABNORMAL HIGH (ref 0.0–0.2)

## 2020-03-08 LAB — URIC ACID: Uric Acid, Serum: 8.2 mg/dL — ABNORMAL HIGH (ref 2.5–7.1)

## 2020-03-08 LAB — LACTATE DEHYDROGENASE: LDH: 884 U/L — ABNORMAL HIGH (ref 98–192)

## 2020-03-08 LAB — MAGNESIUM: Magnesium: 2.4 mg/dL (ref 1.7–2.4)

## 2020-03-08 LAB — PHOSPHORUS: Phosphorus: 3.6 mg/dL (ref 2.5–4.6)

## 2020-03-08 MED ORDER — DASATINIB 70 MG PO TABS: 70.0000 mg | ORAL_TABLET | Freq: Every day | ORAL | 0 refills | Status: DC

## 2020-03-08 NOTE — Patient Instructions (Addendum)
[place] at [facility] Discharge Instructions  You were seen today by Dr. Delton Coombes. He went over your recent test results. Your biopsy showed that you have CML (chronic myeloid leukemia). He discussed medication options and what the side effects could be. He discussed starting you on a pill called Sprycel, it should be taken once a day on an empty stomach. He will start you out on a lower dose of the pill and monitor you every few weeks to keep an on your labs and how you are tolerating the medication. If you start experiencing shortness of breath with walking or exertion please let us know. Watch for leg and ankle swelling, if this occurs please let us know. You will have blood drawn today prior to leaving the hospital. Your medication will come from a specialty pharmacy and will be sent to you. You will need to take this medication for at least 3 years. He will see you back in 3 weeks for labs and follow up.   Thank you for choosing [place] at [facility] to provide your oncology and hematology care.  To afford each patient quality time with our provider, please arrive at least 15 minutes before your scheduled appointment time.   If you have a lab appointment with the [facility] please come in thru the  Main Entrance and check in at the main information desk  You need to re-schedule your appointment should you arrive 10 or more minutes late.  We strive to give you quality time with our providers, and arriving late affects you and other patients whose appointments are after yours.  Also, if you no show three or more times for appointments you may be dismissed from the clinic at the providers discretion.     Again, thank you for choosing [facility].  Our hope is that these requests will decrease the amount of time that you wait before being seen by our physicians.        _____________________________________________________________  Should you have questions after your visit to [facility], please contact our office at [phone] between the hours of 8:00 a.m. and 4:30 p.m.  Voicemails left after 4:00 p.m. will not be returned until the following business day.  For prescription refill requests, have your pharmacy contact our office and allow 72 hours.    Cancer Center Support Programs:   > Cancer Support Group  2nd Tuesday of the month 1pm-2pm, Journey Room

## 2020-03-08 NOTE — Assessment & Plan Note (Addendum)
1.  CML in chronic phase: -Presentation with hyperleukocytosis.  BCR/ABL by FISH positive. -CT chest PE protocol showed splenomegaly measuring 14 cm. -We reviewed bone marrow biopsy dated 02/23/2020 which shows hypercellular marrow with myeloid hyperplasia consistent with her chronic myeloid leukemia.  No increase in blasts seen.  Flow cytometry shows less than 1% blasts.  Chromosome analysis shows Philadelphia chromosome. -We reviewed normal prognosis of chronic myeloid leukemia in chronic phase.  I have recommended treatment with tyrosine kinase inhibitor.  She has lost some weight but was eating better. -I will choose second-generation TKI such as dasatinib.  We will start her on slightly lower dose of 70 mg daily to see how she tolerates it.  We can increase to 200 mg if she tolerates well. -We talked about the side effects including fluid retention and pleural/pericardial effusions in detail. -We will send prescription request to specialty pharmacy.  I will see her back in 7 to 10 days for follow-up.  2.  Tumor lysis syndrome: -We have started her on allopurinol 300 mg daily which she is taking.  Her uric acid today is elevated at 8.2.  Magnesium and phosphate were normal.  Potassium is slightly low at 3.3. -We will increase allopurinol to 300 mg twice daily.  3.  Hypertension: -She will continue losartan 50 mg daily.  Blood pressure today is 130/75.  4.  Normocytic anemia: -Hemoglobin today is 8.8.  This is likely from bone marrow infiltration from leukemia cells.  It will improve with therapy.

## 2020-03-08 NOTE — Progress Notes (Signed)
Marcia Hahn, [name] [address]   CLINIC:  Medical Oncology/Hematology  PCP:  Fayrene Helper, MD [address], [place] [address] [phone]   REASON FOR VISIT:  Follow-up for CML.   CURRENT THERAPY: Dasatinib 70 mg daily.   INTERVAL HISTORY:  [name] C[place] 44 y.o. female seen for follow-up of leukocytosis.  She underwent bone marrow aspiration biopsy.  She has lost few pounds and was trying to eat healthy.  Appetite is 100%.  Energy levels are 100%.  She has gone back to work.  Denies any fevers, night sweats.    REVIEW OF SYSTEMS:  Review of Systems  All other systems reviewed and are negative.    PAST MEDICAL/SURGICAL HISTORY:  Past Medical History:  Diagnosis Date  . Acne vulgaris   . Hypertension   . Mild obesity    Past Surgical History:  Procedure Laterality Date  . BREAST BIOPSY Left   . CESAREAN SECTION       SOCIAL HISTORY:  Social History   Socioeconomic History  . Marital status: Single    Spouse name: Not on file  . Number of children: 2  . Years of education: Not on file  . Highest education level: Not on file  Occupational History  . Occupation: Psychologist, educational   Tobacco Use  . Smoking status: Former Smoker    Types: Cigarettes    Quit date: 12/04/2012    Years since quitting: 7.2  . Smokeless tobacco: Never Used  Substance and Sexual Activity  . Alcohol use: Yes    Comment: occasional  . Drug use: No  . Sexual activity: Yes    Birth control/protection: I.U.D.  Other Topics Concern  . Not on file  Social History Narrative   Mother of twins    Social Determinants of Health   Financial Resource Strain:   . Difficulty of Paying Living Expenses:   Food Insecurity:   . Worried About Charity fundraiser in the Last Year:   . Arboriculturist in the Last Year:   Transportation Needs:   . Film/video editor (Medical):   Marland Kitchen Lack of Transportation (Non-Medical):   Physical  Activity:   . Days of Exercise per Week:   . Minutes of Exercise per Session:   Stress:   . Feeling of Stress :   Social Connections:   . Frequency of Communication with Friends and Family:   . Frequency of Social Gatherings with Friends and Family:   . Attends Religious Services:   . Active Member of Clubs or Organizations:   . Attends Archivist Meetings:   Marland Kitchen Marital Status:   Intimate Partner Violence:   . Fear of Current or Ex-Partner:   . Emotionally Abused:   Marland Kitchen Physically Abused:   . Sexually Abused:     FAMILY HISTORY:  Family History  Problem Relation Age of Onset  . GER disease Mother   . Diabetes Mother   . Hypertension Mother   . Heart attack Father 32  . Heart disease Father   . Migraines Sister   . Breast cancer Maternal Grandmother   . Stroke Maternal Grandmother   . Breast cancer Cousin   . Stroke Maternal Aunt     CURRENT MEDICATIONS:  Outpatient Encounter Medications as of 03/08/2020  Medication Sig Note  . allopurinol (ZYLOPRIM) 300 MG tablet Take 300 mg by mouth daily.   . ergocalciferol (VITAMIN D2) 1.25 MG (  50000 UT) capsule Take 1 capsule (50,000 Units total) by mouth once a week. One capsule once weekly 02/08/2020: Patient takes on Sundays  . ferrous sulfate (FEROSUL) 325 (65 FE) MG tablet Take 1 tablet (325 mg total) by mouth 2 (two) times daily with a meal.   . losartan (COZAAR) 50 MG tablet Take 1 tablet (50 mg total) by mouth daily.   . Multiple Vitamin (MULTIVITAMIN WITH MINERALS) TABS tablet Take 1 tablet by mouth daily.   Marland Kitchen spironolactone (ALDACTONE) 25 MG tablet Take 0.5 tablets (12.5 mg total) by mouth daily.   . dasatinib (SPRYCEL) 70 MG tablet Take 1 tablet (70 mg total) by mouth daily.    No facility-administered encounter medications on file as of 03/08/2020.    ALLERGIES:  Allergies  Allergen Reactions  . Amlodipine     Headache   . Tomato     Processed tomatoes in the can.      PHYSICAL EXAM:  ECOG Performance  status: 0  Vitals:   03/08/20 1520  BP: 130/75  Pulse: 100  Resp: 18  Temp: (!) 97.5 F (36.4 C)  SpO2: 97%   Filed Weights   03/08/20 1520  Weight: 203 lb 8 oz (92.3 kg)   Physical Exam Vitals reviewed.  Constitutional:      Appearance: Normal appearance.  HENT:     Head: Normocephalic.  Eyes:     Conjunctiva/sclera: Conjunctivae normal.  Neurological:     General: No focal deficit present.     Mental Status: She is alert and oriented to person, place, and time.  Psychiatric:        Mood and Affect: Mood normal.        Behavior: Behavior normal.      LABORATORY DATA:  I have reviewed the labs as listed.  CBC    Component Value Date/Time   WBC 127.5 (HH) 03/08/2020 1623   RBC 2.67 (L) 03/08/2020 1623   HGB 8.0 (L) 03/08/2020 1623   HCT 25.9 (L) 03/08/2020 1623   PLT 185 03/08/2020 1623   MCV 97.0 03/08/2020 1623   MCH 30.0 03/08/2020 1623   MCHC 30.9 03/08/2020 1623   RDW 17.3 (H) 03/08/2020 1623   LYMPHSABS 10.7 (H) 03/08/2020 1623   MONOABS 3.8 (H) 03/08/2020 1623   EOSABS 2.3 (H) 03/08/2020 1623   BASOSABS 5.1 (H) 03/08/2020 1623   CMP Latest Ref Rng & Units 03/08/2020 02/09/2020 02/08/2020  Glucose 70 - 99 mg/dL 109(H) 94 92  BUN 6 - 20 mg/dL '13 12 15  ' Creatinine 0.44 - 1.00 mg/dL 0.86 0.97 1.04(H)  Sodium 135 - 145 mmol/L 138 140 140  Potassium 3.5 - 5.1 mmol/L 3.3(L) 3.4(L) 3.4(L)  Chloride 98 - 111 mmol/L 107 108 104  CO2 22 - 32 mmol/L '24 24 27  ' Calcium 8.9 - 10.3 mg/dL 8.5(L) 8.6(L) 9.3  Total Protein 6.5 - 8.1 g/dL 6.8 - -  Total Bilirubin 0.3 - 1.2 mg/dL 0.6 - -  Alkaline Phos 38 - 126 U/L 47 - -  AST 15 - 41 U/L 31 - -  ALT 0 - 44 U/L 31 - -    DIAGNOSTIC IMAGING:  I have reviewed scans.  ASSESSMENT & PLAN:  CML (chronic myelocytic leukemia) ([place]) 1.  CML in chronic phase: -Presentation with hyperleukocytosis.  BCR/ABL by FISH positive. -CT chest PE protocol showed splenomegaly measuring 14 cm. -We reviewed bone marrow biopsy dated  02/23/2020 which shows hypercellular marrow with myeloid hyperplasia consistent with her chronic myeloid leukemia.  No increase in blasts seen.  Flow cytometry shows less than 1% blasts.  Chromosome analysis shows Philadelphia chromosome. -We reviewed normal prognosis of chronic myeloid leukemia in chronic phase.  I have recommended treatment with tyrosine kinase inhibitor.  She has lost some weight but was eating better. -I will choose second-generation TKI such as dasatinib.  We will start her on slightly lower dose of 70 mg daily to see how she tolerates it.  We can increase to 200 mg if she tolerates well. -We talked about the side effects including fluid retention and pleural/pericardial effusions in detail. -We will send prescription request to specialty pharmacy.  I will see her back in 7 to 10 days for follow-up.  2.  Tumor lysis syndrome: -We have started her on allopurinol 300 mg daily which she is taking.  Her uric acid today is elevated at 8.2.  Magnesium and phosphate were normal.  Potassium is slightly low at 3.3. -We will increase allopurinol to 300 mg twice daily.  3.  Hypertension: -She will continue losartan 50 mg daily.  Blood pressure today is 130/75.  4.  Normocytic anemia: -Hemoglobin today is 8.8.  This is likely from bone marrow infiltration from leukemia cells.  It will improve with therapy.     Orders placed this encounter:  Orders Placed This Encounter  Procedures  . CBC with Differential  . Magnesium  . Phosphorus  . Uric acid  . Comprehensive metabolic panel  . BCR-ABL1, CML/ALL, PCR, QUANT  . Lactate dehydrogenase   Total time spent is 40 minutes with more than 50% of the time spent face-to-face discussing new diagnosis, prognosis, treatment plan, side effects, counseling and coordination of care.   Derek Jack, MD [place] [phone]

## 2020-03-08 NOTE — Progress Notes (Signed)
Critical value alert:   WBC 127  Provider aware, no orders at this time.

## 2020-03-08 NOTE — Progress Notes (Signed)
I met with patient following the visit with Dr. Delton Coombes today. I provided written education material on Sprycel.  I went over the process with our specialty pharmacy and explained that she would get a call from the pharmacist. She was given the opportunity to ask questions and all were answered to her satisfaction.

## 2020-03-09 ENCOUNTER — Telehealth (HOSPITAL_COMMUNITY): Payer: Self-pay | Admitting: Pharmacist

## 2020-03-09 ENCOUNTER — Telehealth (HOSPITAL_COMMUNITY): Payer: Self-pay | Admitting: Pharmacy Technician

## 2020-03-09 DIAGNOSIS — C9210 Chronic myeloid leukemia, BCR/ABL-positive, not having achieved remission: Secondary | ICD-10-CM

## 2020-03-09 NOTE — Telephone Encounter (Signed)
Oral Oncology Patient Advocate Encounter   Received notification from [employer] that a prior authorization for Sprycel is required.   PA submitted on CoverMyMeds Key [id number]  Status is pending   Oral Oncology Clinic will continue to follow.  [place] Patient [place] Phone [phone] Fax [phone] 03/09/2020 3:44 PM

## 2020-03-09 NOTE — Telephone Encounter (Signed)
Oral Oncology Pharmacist Encounter  Received new prescription for Sprycel (dasatinib) for the treatment of newly diagnosed CML, planned duration until disease progression or unacceptable drug toxicity.  Prescription dose and frequency assessed. MD plans to start her at a reduced dose and increase as tolerated.  Current medication list in Epic reviewed, no DDIs with dasatinib identified.  Prescription has been e-scribed to the [facility] for benefits analysis and approval.  Oral Oncology Clinic will continue to follow for insurance authorization, copayment issues, initial counseling and start date.  Darl Pikes, PharmD, BCPS, BCOP, CPP Hematology/Oncology Clinical Pharmacist [facility] Oral [place] [facility] [phone]  03/09/2020 1:41 PM

## 2020-03-11 MED ORDER — DASATINIB 70 MG PO TABS: 70.0000 mg | ORAL_TABLET | Freq: Every day | ORAL | 0 refills | Status: DC

## 2020-03-11 NOTE — Telephone Encounter (Addendum)
Oral Oncology Patient Advocate Encounter  Prior Authorization for Sprycel has been approved.    PA# [id number] Effective dates: 03/09/20 through 03/09/21  Specialty medication (Sprycel) must be filled at [facility].  Oral Oncology Clinic will continue to follow.    [place] H[place] Patient [place] Phone [phone] Fax [phone] 03/11/2020 12:14 PM

## 2020-03-11 NOTE — Telephone Encounter (Signed)
Oral Chemotherapy Pharmacist Encounter  Patient Education I spoke with patient for overview of new oral chemotherapy medication: Sprycel (dasatinib) for the treatment of newly diagnosed CML, planned duration until disease progression or unacceptable drug toxicity.   Counseled patient on administration, dosing, side effects, monitoring, drug-food interactions, safe handling, storage, and disposal. Patient will take 1 tablet (70 mg total) by mouth daily.  Side effects include but not limited to: edema, diarrhea, decreased wbc/hgb/plt.    Reviewed with patient importance of keeping a medication schedule and plan for any missed doses.  Marcia Hahn voiced understanding and appreciation. All questions answered. Medication handout placed in the mail.  Provided patient with Oral [place] [facility] phone number. Patient knows to call the office with questions or concerns. Oral Chemotherapy Navigation Clinic will continue to follow.  Darl Pikes, PharmD, BCPS, BCOP, CPP Hematology/Oncology Clinical Pharmacist [facility] Oral [place] [facility] [phone]  03/11/2020 1:50 PM

## 2020-03-11 NOTE — Telephone Encounter (Signed)
Oral Chemotherapy Pharmacist Encounter  Due to insurance restriction the medication could not be filled at [facility]. Prescription has been e-scribed to [place].  Supportive information was faxed to [place]. We will continue to follow medication access.   Patient notified that the prescription was sent out to [facility]. Provided patient with the [facility] pharmacy number  Darl Pikes, PharmD, BCPS, [facility] Hematology/Oncology Clinical Pharmacist [facility] [place] [facility] [phone]  03/11/2020 1:28 PM

## 2020-03-14 ENCOUNTER — Ambulatory Visit: Payer: BC Managed Care – PPO | Admitting: Family Medicine

## 2020-03-14 LAB — BCR-ABL1, CML/ALL, PCR, QUANT
E1A2 Transcript: 0.2062 %
Interpretation (BCRAL):: POSITIVE
b2a2 transcript: 0.7453 %

## 2020-03-14 NOTE — Telephone Encounter (Signed)
Called [facility] times today and I was disconnected both times.  Will try again to reach [facility] for status of medication.  [place] Patient [place] Phone [phone] Fax [phone] 03/14/2020 3:44 PM

## 2020-03-15 ENCOUNTER — Other Ambulatory Visit (HOSPITAL_COMMUNITY): Payer: Self-pay | Admitting: *Deleted

## 2020-03-15 MED ORDER — ALLOPURINOL 300 MG PO TABS: 300.0000 mg | ORAL_TABLET | Freq: Two times a day (BID) | ORAL | 2 refills | Status: DC

## 2020-03-15 NOTE — Telephone Encounter (Signed)
Per Dr. Delton Coombes, he wants patient to take allopurinol twice daily. New prescription sent to patients pharmacy. Patient is aware.

## 2020-03-17 NOTE — Telephone Encounter (Signed)
Oral Oncology Patient Advocate Encounter  Received a call from [place], RN at [facility], that [facility] needed a PA for the 70mg  Sprycel.  Dianne submitted PA through CoverMyMeds and it is pending a decision from insurance.  CMM KEY:  [id number]  Once approved, I will call [facility] to inform them of the approval and check copay status.  [place] Patient [place] Phone [phone] Fax [phone] 03/17/2020 2:29 PM

## 2020-03-21 NOTE — Telephone Encounter (Signed)
Oral Oncology Patient Advocate Encounter  PA for Sprycel 70mg  has been approved from 03/17/20-03/17/21.   Per [facility], patients copay is $300.  A copay card is available to help reduce patients out of pocket cost.  I will reach out to the patient to see if she needs any assistance getting the copay card.

## 2020-03-22 NOTE — Telephone Encounter (Signed)
Spoke to patient yesterday afternoon.  She is going to go online and sign up for the Sprycel Copay Card.  I told her to call me if she needed any assistance.    Made patient aware that the copay card is a dual card program.  Patient will receive a copay card for the pharmacy and will be emailed a virtual debit card.  She can call [facility] and give them the information to run through the register.  [place] Patient [place] Phone [phone] Fax [phone] 03/22/2020 11:26 AM

## 2020-03-30 ENCOUNTER — Other Ambulatory Visit (HOSPITAL_COMMUNITY): Payer: Self-pay | Admitting: *Deleted

## 2020-03-30 DIAGNOSIS — C9210 Chronic myeloid leukemia, BCR/ABL-positive, not having achieved remission: Secondary | ICD-10-CM

## 2020-03-31 ENCOUNTER — Inpatient Hospital Stay (HOSPITAL_COMMUNITY): Payer: BC Managed Care – PPO | Attending: Hematology | Admitting: Hematology

## 2020-03-31 ENCOUNTER — Encounter (HOSPITAL_COMMUNITY): Payer: Self-pay | Admitting: Hematology

## 2020-03-31 ENCOUNTER — Other Ambulatory Visit: Payer: Self-pay

## 2020-03-31 ENCOUNTER — Inpatient Hospital Stay (HOSPITAL_COMMUNITY): Payer: BC Managed Care – PPO

## 2020-03-31 VITALS — BP 128/60 | HR 75 | Temp 97.5°F | Resp 17 | Wt 201.9 lb

## 2020-03-31 DIAGNOSIS — D649 Anemia, unspecified: Secondary | ICD-10-CM | POA: Diagnosis not present

## 2020-03-31 DIAGNOSIS — R61 Generalized hyperhidrosis: Secondary | ICD-10-CM | POA: Insufficient documentation

## 2020-03-31 DIAGNOSIS — C9210 Chronic myeloid leukemia, BCR/ABL-positive, not having achieved remission: Secondary | ICD-10-CM | POA: Diagnosis not present

## 2020-03-31 DIAGNOSIS — Z87891 Personal history of nicotine dependence: Secondary | ICD-10-CM | POA: Insufficient documentation

## 2020-03-31 DIAGNOSIS — I10 Essential (primary) hypertension: Secondary | ICD-10-CM | POA: Diagnosis not present

## 2020-03-31 DIAGNOSIS — R634 Abnormal weight loss: Secondary | ICD-10-CM | POA: Diagnosis not present

## 2020-03-31 DIAGNOSIS — E883 Tumor lysis syndrome: Secondary | ICD-10-CM | POA: Insufficient documentation

## 2020-03-31 LAB — COMPREHENSIVE METABOLIC PANEL
ALT: 27 U/L (ref 0–44)
AST: 33 U/L (ref 15–41)
Albumin: 4.4 g/dL (ref 3.5–5.0)
Alkaline Phosphatase: 55 U/L (ref 38–126)
Anion gap: 8 (ref 5–15)
BUN: 16 mg/dL (ref 6–20)
CO2: 24 mmol/L (ref 22–32)
Calcium: 8.9 mg/dL (ref 8.9–10.3)
Chloride: 104 mmol/L (ref 98–111)
Creatinine, Ser: 1.09 mg/dL — ABNORMAL HIGH (ref 0.44–1.00)
GFR calc Af Amer: >60 mL/min (ref >60)
GFR calc non Af Amer: >60 mL/min (ref >60)
Glucose, Bld: 102 mg/dL — ABNORMAL HIGH (ref 70–99)
Potassium: 3.6 mmol/L (ref 3.5–5.1)
Sodium: 136 mmol/L (ref 135–145)
Total Bilirubin: 0.6 mg/dL (ref 0.3–1.2)
Total Protein: 7.2 g/dL (ref 6.5–8.1)

## 2020-03-31 LAB — CBC WITH DIFFERENTIAL/PLATELET
Abs Immature Granulocytes: 46.97 10*3/uL — ABNORMAL HIGH (ref 0.00–0.07)
Basophils Absolute: 6.6 10*3/uL — ABNORMAL HIGH (ref 0.0–0.1)
Basophils Relative: 4 %
Eosinophils Absolute: 3.3 10*3/uL — ABNORMAL HIGH (ref 0.0–0.5)
Eosinophils Relative: 2 %
HCT: 26.3 % — ABNORMAL LOW (ref 36.0–46.0)
Hemoglobin: 8.3 g/dL — ABNORMAL LOW (ref 12.0–15.0)
Immature Granulocytes: 30 %
Lymphocytes Relative: 8 %
Lymphs Abs: 11.7 10*3/uL — ABNORMAL HIGH (ref 0.7–4.0)
MCH: 30.5 pg (ref 26.0–34.0)
MCHC: 31.6 g/dL (ref 30.0–36.0)
MCV: 96.7 fL (ref 80.0–100.0)
Monocytes Absolute: 4.8 10*3/uL — ABNORMAL HIGH (ref 0.1–1.0)
Monocytes Relative: 3 %
Neutro Abs: 82.7 10*3/uL — ABNORMAL HIGH (ref 1.7–7.7)
Neutrophils Relative %: 53 %
Platelets: 180 10*3/uL (ref 150–400)
RBC: 2.72 MIL/uL — ABNORMAL LOW (ref 3.87–5.11)
RDW: 17.5 % — ABNORMAL HIGH (ref 11.5–15.5)
WBC Morphology: INCREASED
WBC: 156.1 10*3/uL (ref 4.0–10.5)
nRBC: 1.3 % — ABNORMAL HIGH (ref 0.0–0.2)

## 2020-03-31 LAB — MAGNESIUM: Magnesium: 2.1 mg/dL (ref 1.7–2.4)

## 2020-03-31 LAB — PHOSPHORUS: Phosphorus: 5.3 mg/dL — ABNORMAL HIGH (ref 2.5–4.6)

## 2020-03-31 LAB — URIC ACID: Uric Acid, Serum: 5.3 mg/dL (ref 2.5–7.1)

## 2020-03-31 NOTE — Patient Instructions (Signed)
[place] at [facility] Discharge Instructions  You were seen today by Dr. Delton Coombes. He went over your recent results. I encourage you to drink more water. If you don't get your pills tomorrow, please call us. If you get headaches, or more blurring, please go to the ER. He will see you back in for labs and follow up.   Thank you for choosing [place] at [facility] to provide your oncology and hematology care.  To afford each patient quality time with our provider, please arrive at least 15 minutes before your scheduled appointment time.   If you have a lab appointment with the [place] please come in thru the  Main Entrance and check in at the main information desk  You need to re-schedule your appointment should you arrive 10 or more minutes late.  We strive to give you quality time with our providers, and arriving late affects you and other patients whose appointments are after yours.  Also, if you no show three or more times for appointments you may be dismissed from the clinic at the providers discretion.     Again, thank you for choosing [facility].  Our hope is that these requests will decrease the amount of time that you wait before being seen by our physicians.       _____________________________________________________________  Should you have questions after your visit to [facility] please contact our office at [phone] between the hours of 8:00 a.m. and 4:30 p.m.  Voicemails left after 4:00 p.m. will not be returned until the following business day.  For prescription refill requests, have your pharmacy contact our office and allow 72 hours.    Cancer Center Support Programs:   > Cancer Support Group  2nd Tuesday of the month 1pm-2pm, Journey Room

## 2020-03-31 NOTE — Assessment & Plan Note (Signed)
1.  CML in chronic phase: -Presentation with hyperleukocytosis.  BCR/ABL by FISH positive.  CT chest PE protocol showed splenomegaly measuring 14 cm. -BMBX on 02/23/2020 shows hypercellular marrow with myeloid hyperplasia consistent with CML.  No increase in blasts.  Flow cytometry less than 1% blasts.  Chromosome analysis shows Philadelphia chromosome. -I have sent a prescription for dasatinib 70 mg daily.  Unfortunately patient did not receive it yet.  She will receive it tomorrow. -She reported occasional night sweats even prior to all this.  This is likely from hot flashes. -She reported occasional headaches which are also chronic.  Reports rare blurring of vision lasting about 3 to 4 seconds. -I reviewed her labs today.  White count is 156.  Hemoglobin 8.3 and platelet count is normal.  She was told to call us tomorrow if she does not get the medication.  I will start her on hydroxyurea.  2.  Tumor lysis syndrome: -Continue allopurinol 300 mg twice daily.  Uric acid has normalized.  Phosphate is slightly high.  3.  Hypertension: Continue losartan 50 mg daily.  Blood pressure is 122/60.  4.  Normocytic anemia: -Hemoglobin is 8.3.  This is from infiltration from leukemia cells.  It will improve with therapy. 

## 2020-03-31 NOTE — Progress Notes (Signed)
 [facility] [address] [place], [place] [address]   CLINIC:  Medical Oncology/Hematology  PCP:  Hahn, Margaret E, MD [address] / [place] [address]  [phone]  REASON FOR VISIT:  Follow-up for CML  CURRENT THERAPY: to start dasatinib   INTERVAL HISTORY:  Marcia Hahn 44 y.o. female returns for routine follow-up for her CML. Marcia Hahn was last seen on 03/08/2020.  She notes that she was supposed to start her medication, but that her prescription was canceled and the pharmacy called her. She followed all of the instructions to get her medication, but something happened. She has a confirmation for delivery for tomorrow.   She has been having night sweats and weight loss, but she states that she has changed her diet some.   REVIEW OF SYSTEMS:  Review of Systems  Constitutional: Positive for diaphoresis. Negative for appetite change, chills, fatigue and fever.  HENT:   Negative for mouth sores, sore throat and trouble swallowing.   Eyes: Positive for eye problems (vision bluriness on occasion).  Respiratory: Negative for cough, shortness of breath and wheezing.   Cardiovascular: Negative for chest pain, leg swelling and palpitations.  Gastrointestinal: Positive for diarrhea (intermittent). Negative for abdominal pain, constipation, nausea and vomiting.  Genitourinary: Negative for bladder incontinence, dysuria and frequency.   Musculoskeletal: Positive for flank pain. Negative for arthralgias, back pain and myalgias.  Skin: Negative for rash.  Neurological: Positive for headaches (occasional). Negative for dizziness, extremity weakness and numbness.  Hematological: Does not bruise/bleed easily.  Psychiatric/Behavioral: Positive for sleep disturbance. Negative for depression. The patient is not nervous/anxious.     PAST MEDICAL/SURGICAL HISTORY:  Past Medical History:  Diagnosis Date  . Acne vulgaris   . Hypertension   . Mild obesity     Past Surgical History:  Procedure Laterality Date  . BREAST BIOPSY Left   . CESAREAN SECTION      SOCIAL HISTORY:  Social History   Socioeconomic History  . Marital status: Single    Spouse name: Not on file  . Number of children: 2  . Years of education: Not on file  . Highest education level: Not on file  Occupational History  . Occupation: manufacturing   Tobacco Use  . Smoking status: Former Smoker    Types: Cigarettes    Quit date: 12/04/2012    Years since quitting: 7.3  . Smokeless tobacco: Never Used  Substance and Sexual Activity  . Alcohol use: Yes    Comment: occasional  . Drug use: No  . Sexual activity: Yes    Birth control/protection: I.U.D.  Other Topics Concern  . Not on file  Social History Narrative   Mother of twins    Social Determinants of Health   Financial Resource Strain:   . Difficulty of Paying Living Expenses:   Food Insecurity:   . Worried About Running Out of Food in the Last Year:   . Ran Out of Food in the Last Year:   Transportation Needs:   . Lack of Transportation (Medical):   . Lack of Transportation (Non-Medical):   Physical Activity:   . Days of Exercise per Week:   . Minutes of Exercise per Session:   Stress:   . Feeling of Stress :   Social Connections:   . Frequency of Communication with Friends and Family:   . Frequency of Social Gatherings with Friends and Family:   . Attends Religious Services:   . Active Member of   Clubs or Organizations:   . Attends Club or Organization Meetings:   . Marital Status:   Intimate Partner Violence:   . Fear of Current or Ex-Partner:   . Emotionally Abused:   . Physically Abused:   . Sexually Abused:     FAMILY HISTORY:  Family History  Problem Relation Age of Onset  . GER disease Mother   . Diabetes Mother   . Hypertension Mother   . Heart attack Father 48  . Heart disease Father   . Migraines Sister   . Breast cancer Maternal Grandmother   . Stroke Maternal  Grandmother   . Breast cancer Cousin   . Stroke Maternal Aunt     CURRENT MEDICATIONS:  Current Outpatient Medications  Medication Sig Dispense Refill  . allopurinol (ZYLOPRIM) 300 MG tablet Take 1 tablet (300 mg total) by mouth 2 (two) times daily. 60 tablet 2  . ergocalciferol (VITAMIN D2) 1.25 MG (50000 UT) capsule Take 1 capsule (50,000 Units total) by mouth once a week. One capsule once weekly 12 capsule 2  . ferrous sulfate (FEROSUL) 325 (65 FE) MG tablet Take 1 tablet (325 mg total) by mouth 2 (two) times daily with a meal. 60 tablet 3  . losartan (COZAAR) 50 MG tablet Take 1 tablet (50 mg total) by mouth daily. 90 tablet 3  . Multiple Vitamin (MULTIVITAMIN WITH MINERALS) TABS tablet Take 1 tablet by mouth daily. 30 tablet 3  . spironolactone (ALDACTONE) 25 MG tablet Take 0.5 tablets (12.5 mg total) by mouth daily. 45 tablet 3  . dasatinib (SPRYCEL) 70 MG tablet Take 1 tablet (70 mg total) by mouth daily. (Patient not taking: Reported on 03/31/2020) 30 tablet 0   No current facility-administered medications for this visit.    ALLERGIES:  Allergies  Allergen Reactions  . Amlodipine     Headache   . Tomato     Processed tomatoes in the can.     PHYSICAL EXAM:  Performance status (ECOG): 0 - Asymptomatic  Vitals:   03/31/20 1536  BP: 128/60  Pulse: 75  Resp: 17  Temp: (!) 97.5 F (36.4 C)  SpO2: 98%   Wt Readings from Last 3 Encounters:  03/31/20 201 lb 14.4 oz (91.6 kg)  03/08/20 203 lb 8 oz (92.3 kg)  02/23/20 204 lb (92.5 kg)   Physical Exam Constitutional:      General: She is not in acute distress.    Appearance: Normal appearance. She is normal weight. She is not ill-appearing.  HENT:     Mouth/Throat:     Mouth: Mucous membranes are moist.     Pharynx: No oropharyngeal exudate or posterior oropharyngeal erythema.  Eyes:     Extraocular Movements: Extraocular movements intact.     Pupils: Pupils are equal, round, and reactive to light.  Cardiovascular:      Rate and Rhythm: Normal rate and regular rhythm.     Pulses: Normal pulses.     Heart sounds: Normal heart sounds. No murmur. No friction rub. No gallop.   Pulmonary:     Effort: Pulmonary effort is normal.     Breath sounds: Normal breath sounds. No wheezing, rhonchi or rales.  Abdominal:     Palpations: There is no mass.     Tenderness: There is no abdominal tenderness. There is no guarding.  Musculoskeletal:        General: No swelling or tenderness.     Right lower leg: No edema.     Left lower leg:   No edema.  Skin:    Findings: No bruising or erythema.  Neurological:     Mental Status: She is alert and oriented to person, place, and time.     Sensory: No sensory deficit.  Psychiatric:        Mood and Affect: Mood normal.        Behavior: Behavior normal.        Thought Content: Thought content normal.        Judgment: Judgment normal.   Spleen tip palpable on deep inspiration.  LABORATORY DATA:  I have reviewed the labs as listed.  CBC Latest Ref Rng & Units 03/31/2020 03/08/2020 02/23/2020  WBC 4.0 - 10.5 K/uL 156.1(HH) 127.5(HH) 154.5(HH)  Hemoglobin 12.0 - 15.0 g/dL 8.3(L) 8.0(L) 9.4(L)  Hematocrit 36.0 - 46.0 % 26.3(L) 25.9(L) 30.2(L)  Platelets 150 - 400 K/uL 180 185 159   CMP Latest Ref Rng & Units 03/31/2020 03/08/2020 02/09/2020  Glucose 70 - 99 mg/dL 102(H) 109(H) 94  BUN 6 - 20 mg/dL 16 13 12  Creatinine 0.44 - 1.00 mg/dL 1.09(H) 0.86 0.97  Sodium 135 - 145 mmol/L 136 138 140  Potassium 3.5 - 5.1 mmol/L 3.6 3.3(L) 3.4(L)  Chloride 98 - 111 mmol/L 104 107 108  CO2 22 - 32 mmol/L 24 24 24  Calcium 8.9 - 10.3 mg/dL 8.9 8.5(L) 8.6(L)  Total Protein 6.5 - 8.1 g/dL 7.2 6.8 -  Total Bilirubin 0.3 - 1.2 mg/dL 0.6 0.6 -  Alkaline Phos 38 - 126 U/L 55 47 -  AST 15 - 41 U/L 33 31 -  ALT 0 - 44 U/L 27 31 -       Component Value Date/Time   RBC 2.72 (L) 03/31/2020 1520   MCV 96.7 03/31/2020 1520   MCH 30.5 03/31/2020 1520   MCHC 31.6 03/31/2020 1520   RDW 17.5  (H) 03/31/2020 1520   LYMPHSABS 11.7 (H) 03/31/2020 1520   MONOABS 4.8 (H) 03/31/2020 1520   EOSABS 3.3 (H) 03/31/2020 1520   BASOSABS 6.6 (H) 03/31/2020 1520    DIAGNOSTIC IMAGING:  I have independently reviewed the scans and discussed with the patient.  ASSESSMENT & PLAN:  CML (chronic myelocytic leukemia) (HCC) 1.  CML in chronic phase: -Presentation with hyperleukocytosis.  BCR/ABL by FISH positive.  CT chest PE protocol showed splenomegaly measuring 14 cm. -BMBX on 02/23/2020 shows hypercellular marrow with myeloid hyperplasia consistent with CML.  No increase in blasts.  Flow cytometry less than 1% blasts.  Chromosome analysis shows Philadelphia chromosome. -I have sent a prescription for dasatinib 70 mg daily.  Unfortunately patient did not receive it yet.  She will receive it tomorrow. -She reported occasional night sweats even prior to all this.  This is likely from hot flashes. -She reported occasional headaches which are also chronic.  Reports rare blurring of vision lasting about 3 to 4 seconds. -I reviewed her labs today.  White count is 156.  Hemoglobin 8.3 and platelet count is normal.  She was told to call us tomorrow if she does not get the medication.  I will start her on hydroxyurea.  2.  Tumor lysis syndrome: -Continue allopurinol 300 mg twice daily.  Uric acid has normalized.  Phosphate is slightly high.  3.  Hypertension: Continue losartan 50 mg daily.  Blood pressure is 122/60.  4.  Normocytic anemia: -Hemoglobin is 8.3.  This is from infiltration from leukemia cells.  It will improve with therapy.    Orders placed this encounter:  Orders Placed   This Encounter  Procedures  . CBC with Differential/Platelet  . Comprehensive metabolic panel  . Magnesium  . Phosphorus  . Uric acid          Sreedhar Katragadda, MD, 03/31/20 5:29 PM  [facility] [phone]   I, Amber Handy, am acting as a scribe for Dr. Sreedhar Katagadda.  I, Sreedhar  Katragadda MD, have reviewed the above documentation for accuracy and completeness, and I agree with the above.     

## 2020-04-04 NOTE — Telephone Encounter (Signed)
Patient received Sprycel on 04/01/20.

## 2020-04-13 ENCOUNTER — Ambulatory Visit (HOSPITAL_COMMUNITY): Payer: BC Managed Care – PPO | Admitting: Hematology

## 2020-04-13 ENCOUNTER — Other Ambulatory Visit (HOSPITAL_COMMUNITY): Payer: BC Managed Care – PPO

## 2020-04-15 ENCOUNTER — Inpatient Hospital Stay (HOSPITAL_COMMUNITY): Payer: BC Managed Care – PPO

## 2020-04-15 ENCOUNTER — Inpatient Hospital Stay (HOSPITAL_COMMUNITY): Payer: BC Managed Care – PPO | Attending: Hematology

## 2020-04-15 ENCOUNTER — Ambulatory Visit (HOSPITAL_COMMUNITY): Payer: BC Managed Care – PPO | Admitting: Hematology

## 2020-04-15 ENCOUNTER — Other Ambulatory Visit: Payer: Self-pay

## 2020-04-15 ENCOUNTER — Inpatient Hospital Stay (HOSPITAL_COMMUNITY): Payer: BC Managed Care – PPO | Admitting: Hematology

## 2020-04-15 VITALS — BP 129/57 | HR 85 | Temp 97.3°F | Resp 19 | Wt 203.8 lb

## 2020-04-15 DIAGNOSIS — D649 Anemia, unspecified: Secondary | ICD-10-CM | POA: Insufficient documentation

## 2020-04-15 DIAGNOSIS — I10 Essential (primary) hypertension: Secondary | ICD-10-CM | POA: Insufficient documentation

## 2020-04-15 DIAGNOSIS — C9210 Chronic myeloid leukemia, BCR/ABL-positive, not having achieved remission: Secondary | ICD-10-CM

## 2020-04-15 DIAGNOSIS — E883 Tumor lysis syndrome: Secondary | ICD-10-CM | POA: Diagnosis not present

## 2020-04-15 LAB — CBC WITH DIFFERENTIAL/PLATELET
Abs Immature Granulocytes: 4.05 10*3/uL — ABNORMAL HIGH (ref 0.00–0.07)
Basophils Absolute: 0.7 10*3/uL — ABNORMAL HIGH (ref 0.0–0.1)
Basophils Relative: 2 %
Eosinophils Absolute: 0.4 10*3/uL (ref 0.0–0.5)
Eosinophils Relative: 1 %
HCT: 26.1 % — ABNORMAL LOW (ref 36.0–46.0)
Hemoglobin: 8 g/dL — ABNORMAL LOW (ref 12.0–15.0)
Immature Granulocytes: 11 %
Lymphocytes Relative: 9 %
Lymphs Abs: 3.4 10*3/uL (ref 0.7–4.0)
MCH: 29.5 pg (ref 26.0–34.0)
MCHC: 30.7 g/dL (ref 30.0–36.0)
MCV: 96.3 fL (ref 80.0–100.0)
Monocytes Absolute: 1.2 10*3/uL — ABNORMAL HIGH (ref 0.1–1.0)
Monocytes Relative: 3 %
Neutro Abs: 28.3 10*3/uL — ABNORMAL HIGH (ref 1.7–7.7)
Neutrophils Relative %: 74 %
Platelets: 146 10*3/uL — ABNORMAL LOW (ref 150–400)
RBC: 2.71 MIL/uL — ABNORMAL LOW (ref 3.87–5.11)
RDW: 17.6 % — ABNORMAL HIGH (ref 11.5–15.5)
WBC: 38.2 10*3/uL — ABNORMAL HIGH (ref 4.0–10.5)
nRBC: 1.5 % — ABNORMAL HIGH (ref 0.0–0.2)

## 2020-04-15 LAB — COMPREHENSIVE METABOLIC PANEL
ALT: 28 U/L (ref 0–44)
AST: 23 U/L (ref 15–41)
Albumin: 4 g/dL (ref 3.5–5.0)
Alkaline Phosphatase: 43 U/L (ref 38–126)
Anion gap: 8 (ref 5–15)
BUN: 16 mg/dL (ref 6–20)
CO2: 23 mmol/L (ref 22–32)
Calcium: 8.5 mg/dL — ABNORMAL LOW (ref 8.9–10.3)
Chloride: 107 mmol/L (ref 98–111)
Creatinine, Ser: 0.9 mg/dL (ref 0.44–1.00)
GFR calc Af Amer: >60 mL/min (ref >60)
GFR calc non Af Amer: >60 mL/min (ref >60)
Glucose, Bld: 111 mg/dL — ABNORMAL HIGH (ref 70–99)
Potassium: 3.6 mmol/L (ref 3.5–5.1)
Sodium: 138 mmol/L (ref 135–145)
Total Bilirubin: 0.3 mg/dL (ref 0.3–1.2)
Total Protein: 6.7 g/dL (ref 6.5–8.1)

## 2020-04-15 LAB — PHOSPHORUS: Phosphorus: 4.1 mg/dL (ref 2.5–4.6)

## 2020-04-15 LAB — URIC ACID: Uric Acid, Serum: 2.6 mg/dL (ref 2.5–7.1)

## 2020-04-15 LAB — MAGNESIUM: Magnesium: 2.3 mg/dL (ref 1.7–2.4)

## 2020-04-15 NOTE — Progress Notes (Signed)
[place], [place] [address]   CLINIC:  Medical Oncology/Hematology  PCP:  Fayrene Helper, MD [address] [place] / [place] [address]  [phone]  REASON FOR VISIT:  Follow-up for [facility]  CURRENT THERAPY: Dasatinib.  INTERVAL HISTORY:  Marcia Hahn, a 44 y.o. female, returns for routine follow-up for her CML. Marcia Hahn was last seen on 03/31/2020.  She started taking dasatinib 70 mg daily on 04/01/2020.. Initially she was nauseous, but it has resolved. She is sometimes tired, which she attributes to her work. She takes iron tablets BID.   REVIEW OF SYSTEMS:  Review of Systems  Constitutional: Positive for fatigue (mild). Negative for appetite change.  Respiratory: Positive for shortness of breath (w/ exertion).   Cardiovascular: Negative for leg swelling.    PAST MEDICAL/SURGICAL HISTORY:  Past Medical History:  Diagnosis Date  . Acne vulgaris   . Hypertension   . Mild obesity    Past Surgical History:  Procedure Laterality Date  . BREAST BIOPSY Left   . CESAREAN SECTION      SOCIAL HISTORY:  Social History   Socioeconomic History  . Marital status: Single    Spouse name: Not on file  . Number of children: 2  . Years of education: Not on file  . Highest education level: Not on file  Occupational History  . Occupation: Psychologist, educational   Tobacco Use  . Smoking status: Former Smoker    Types: Cigarettes    Quit date: 12/04/2012    Years since quitting: 7.3  . Smokeless tobacco: Never Used  Substance and Sexual Activity  . Alcohol use: Yes    Comment: occasional  . Drug use: No  . Sexual activity: Yes    Birth control/protection: I.U.D.  Other Topics Concern  . Not on file  Social History Narrative   Mother of twins    Social Determinants of Health   Financial Resource Strain:   . Difficulty of Paying Living Expenses:   Food Insecurity:   . Worried About Charity fundraiser in the Last Year:     . Arboriculturist in the Last Year:   Transportation Needs:   . Film/video editor (Medical):   Marland Kitchen Lack of Transportation (Non-Medical):   Physical Activity:   . Days of Exercise per Week:   . Minutes of Exercise per Session:   Stress:   . Feeling of Stress :   Social Connections:   . Frequency of Communication with Friends and Family:   . Frequency of Social Gatherings with Friends and Family:   . Attends Religious Services:   . Active Member of Clubs or Organizations:   . Attends Archivist Meetings:   Marland Kitchen Marital Status:   Intimate Partner Violence:   . Fear of Current or Ex-Partner:   . Emotionally Abused:   Marland Kitchen Physically Abused:   . Sexually Abused:     FAMILY HISTORY:  Family History  Problem Relation Age of Onset  . GER disease Mother   . Diabetes Mother   . Hypertension Mother   . Heart attack Father 54  . Heart disease Father   . Migraines Sister   . Breast cancer Maternal Grandmother   . Stroke Maternal Grandmother   . Breast cancer Cousin   . Stroke Maternal Aunt     CURRENT MEDICATIONS:  Current Outpatient Medications  Medication Sig Dispense Refill  . allopurinol (ZYLOPRIM) 300 MG tablet  Take 1 tablet (300 mg total) by mouth 2 (two) times daily. 60 tablet 2  . dasatinib (SPRYCEL) 70 MG tablet Take 1 tablet (70 mg total) by mouth daily. 30 tablet 0  . ergocalciferol (VITAMIN D2) 1.25 MG (50000 UT) capsule Take 1 capsule (50,000 Units total) by mouth once a week. One capsule once weekly 12 capsule 2  . ferrous sulfate (FEROSUL) 325 (65 FE) MG tablet Take 1 tablet (325 mg total) by mouth 2 (two) times daily with a meal. 60 tablet 3  . losartan (COZAAR) 50 MG tablet Take 1 tablet (50 mg total) by mouth daily. 90 tablet 3  . Multiple Vitamin (MULTIVITAMIN WITH MINERALS) TABS tablet Take 1 tablet by mouth daily. 30 tablet 3  . spironolactone (ALDACTONE) 25 MG tablet Take 0.5 tablets (12.5 mg total) by mouth daily. 45 tablet 3   No current  facility-administered medications for this visit.    ALLERGIES:  Allergies  Allergen Reactions  . Amlodipine     Headache   . Tomato     Processed tomatoes in the can.     PHYSICAL EXAM:  Performance status (ECOG): 0 - Asymptomatic  Vitals:   04/15/20 1347  BP: (!) 129/57  Pulse: 85  Resp: 19  Temp: (!) 97.3 F (36.3 C)  SpO2: 100%   Wt Readings from Last 3 Encounters:  04/15/20 203 lb 12.8 oz (92.4 kg)  03/31/20 201 lb 14.4 oz (91.6 kg)  03/08/20 203 lb 8 oz (92.3 kg)   Physical Exam Vitals reviewed.  Constitutional:      Appearance: Normal appearance.  Cardiovascular:     Rate and Rhythm: Normal rate and regular rhythm.     Pulses: Normal pulses.     Heart sounds: Normal heart sounds.  Pulmonary:     Effort: Pulmonary effort is normal.     Breath sounds: Normal breath sounds.  Abdominal:     Palpations: Abdomen is soft.     Tenderness: There is no abdominal tenderness.     Comments: Tip of spleen palpable  Musculoskeletal:     Right lower leg: No edema.     Left lower leg: No edema.  Neurological:     General: No focal deficit present.     Mental Status: She is alert and oriented to person, place, and time.  Psychiatric:        Mood and Affect: Mood normal.        Behavior: Behavior normal.     LABORATORY DATA:  I have reviewed the labs as listed.  CBC Latest Ref Rng & Units 03/31/2020 03/08/2020 02/23/2020  WBC 4.0 - 10.5 K/uL 156.1(HH) 127.5(HH) 154.5(HH)  Hemoglobin 12.0 - 15.0 g/dL 8.3(L) 8.0(L) 9.4(L)  Hematocrit 36.0 - 46.0 % 26.3(L) 25.9(L) 30.2(L)  Platelets 150 - 400 K/uL 180 185 159   CMP Latest Ref Rng & Units 03/31/2020 03/08/2020 02/09/2020  Glucose 70 - 99 mg/dL 102(H) 109(H) 94  BUN 6 - 20 mg/dL '16 13 12  ' Creatinine 0.44 - 1.00 mg/dL 1.09(H) 0.86 0.97  Sodium 135 - 145 mmol/L 136 138 140  Potassium 3.5 - 5.1 mmol/L 3.6 3.3(L) 3.4(L)  Chloride 98 - 111 mmol/L 104 107 108  CO2 22 - 32 mmol/L '24 24 24  ' Calcium 8.9 - 10.3 mg/dL 8.9 8.5(L)  8.6(L)  Total Protein 6.5 - 8.1 g/dL 7.2 6.8 -  Total Bilirubin 0.3 - 1.2 mg/dL 0.6 0.6 -  Alkaline Phos 38 - 126 U/L 55 47 -  AST 15 -  41 U/L 33 31 -  ALT 0 - 44 U/L 27 31 -      Component Value Date/Time   RBC 2.72 (L) 03/31/2020 1520   MCV 96.7 03/31/2020 1520   MCH 30.5 03/31/2020 1520   MCHC 31.6 03/31/2020 1520   RDW 17.5 (H) 03/31/2020 1520   LYMPHSABS 11.7 (H) 03/31/2020 1520   MONOABS 4.8 (H) 03/31/2020 1520   EOSABS 3.3 (H) 03/31/2020 1520   BASOSABS 6.6 (H) 03/31/2020 1520   Lab Results  Component Value Date   FERRITIN 13 (L) 04/01/2019   FERRITIN 8 (L) 09/16/2018   FERRITIN 5 (L) 06/11/2017    DIAGNOSTIC IMAGING:  I have independently reviewed the scans and discussed with the patient.   ASSESSMENT:  1.  CML in chronic phase: -Presentation with hyperleukocytosis.  BCR/ABL by FISH positive. -CT chest PE protocol showed splenomegaly measuring 14 cm. -BMBX on 02/23/2020 shows hypercellular marrow with myeloid hyperplasia consistent with CML.  No increase in blasts.  Flow cytometry less than 1% blasts.  Chromosome analysis shows Philadelphia chromosome. -Dasatinib 70 mg daily started on 04/01/2020.  PLAN:  1.  CML in chronic phase: -She started dasatinib 70 mg daily on 04/01/2020.  Initially had some nausea which has gotten better.  She is taking at bedtime.  I reviewed her CBC which showed white count improved to 38 from 156 previously. -We will see her back in 2 weeks with repeat labs.  2.  Tumor lysis syndrome: -Uric acid is 2.6.  She is taking allopurinol twice daily. -Have told her to cut back to once daily.  3.  Hypertension: -Continue losartan 50 mg daily.  Blood pressure today is 129/57.  4.  Normocytic anemia: -CBC shows hemoglobin 8 with MCV of 96.  Likely from bone marrow infiltration from leukemia. -She is taking iron tablet twice daily. -I will check ferritin, iron panel, Q19, folic acid at next visit.   Orders placed this encounter:  No orders  of the defined types were placed in this encounter.    Derek Jack, MD [facility] [phone]   I, Milinda Antis, am acting as a scribe for Dr. Sanda Linger.  I, Derek Jack MD, have reviewed the above documentation for accuracy and completeness, and I agree with the above.

## 2020-04-15 NOTE — Patient Instructions (Signed)
[place] at [facility] Discharge Instructions  You were seen today by Dr. Delton Coombes. He went over your recent results. Your blood counts are improving. Dr. Delton Coombes will see you back in 2 weeks for labs and follow up.   Thank you for choosing [place] at [facility] to provide your oncology and hematology care.  To afford each patient quality time with our provider, please arrive at least 15 minutes before your scheduled appointment time.   If you have a lab appointment with the [facility] please come in thru the  Main Entrance and check in at the main information desk  You need to re-schedule your appointment should you arrive 10 or more minutes late.  We strive to give you quality time with our providers, and arriving late affects you and other patients whose appointments are after yours.  Also, if you no show three or more times for appointments you may be dismissed from the clinic at the providers discretion.     Again, thank you for choosing [facility].  Our hope is that these requests will decrease the amount of time that you wait before being seen by our physicians.       _____________________________________________________________  Should you have questions after your visit to [facility], please contact our office at [phone] between the hours of 8:00 a.m. and 4:30 p.m.  Voicemails left after 4:00 p.m. will not be returned until the following business day.  For prescription refill requests, have your pharmacy contact our office and allow 72 hours.    Cancer Center Support Programs:   > Cancer Support Group  2nd Tuesday of the month 1pm-2pm, Journey Room

## 2020-04-18 ENCOUNTER — Other Ambulatory Visit (HOSPITAL_COMMUNITY): Payer: BC Managed Care – PPO

## 2020-04-18 ENCOUNTER — Ambulatory Visit (HOSPITAL_COMMUNITY): Payer: BC Managed Care – PPO | Admitting: Hematology

## 2020-04-21 DIAGNOSIS — Z20822 Contact with and (suspected) exposure to covid-19: Secondary | ICD-10-CM | POA: Diagnosis not present

## 2020-04-26 ENCOUNTER — Other Ambulatory Visit (HOSPITAL_COMMUNITY): Payer: Self-pay | Admitting: *Deleted

## 2020-04-26 DIAGNOSIS — C9210 Chronic myeloid leukemia, BCR/ABL-positive, not having achieved remission: Secondary | ICD-10-CM

## 2020-04-26 MED ORDER — DASATINIB 70 MG PO TABS: 70.0000 mg | ORAL_TABLET | Freq: Every day | ORAL | 0 refills | Status: DC

## 2020-04-26 NOTE — Telephone Encounter (Signed)
Chart reviewed, sprycel refilled. 

## 2020-04-28 ENCOUNTER — Inpatient Hospital Stay (HOSPITAL_COMMUNITY): Payer: BC Managed Care – PPO

## 2020-04-28 ENCOUNTER — Inpatient Hospital Stay (HOSPITAL_BASED_OUTPATIENT_CLINIC_OR_DEPARTMENT_OTHER): Payer: BC Managed Care – PPO | Admitting: Hematology

## 2020-04-28 ENCOUNTER — Other Ambulatory Visit: Payer: Self-pay

## 2020-04-28 VITALS — BP 127/71 | HR 67 | Temp 98.5°F | Resp 18 | Wt 202.1 lb

## 2020-04-28 DIAGNOSIS — C9210 Chronic myeloid leukemia, BCR/ABL-positive, not having achieved remission: Secondary | ICD-10-CM | POA: Diagnosis not present

## 2020-04-28 DIAGNOSIS — E883 Tumor lysis syndrome: Secondary | ICD-10-CM | POA: Diagnosis not present

## 2020-04-28 DIAGNOSIS — I10 Essential (primary) hypertension: Secondary | ICD-10-CM | POA: Diagnosis not present

## 2020-04-28 DIAGNOSIS — D649 Anemia, unspecified: Secondary | ICD-10-CM | POA: Diagnosis not present

## 2020-04-28 LAB — COMPREHENSIVE METABOLIC PANEL
ALT: 27 U/L (ref 0–44)
AST: 25 U/L (ref 15–41)
Albumin: 3.7 g/dL (ref 3.5–5.0)
Alkaline Phosphatase: 47 U/L (ref 38–126)
Anion gap: 8 (ref 5–15)
BUN: 12 mg/dL (ref 6–20)
CO2: 26 mmol/L (ref 22–32)
Calcium: 8.3 mg/dL — ABNORMAL LOW (ref 8.9–10.3)
Chloride: 104 mmol/L (ref 98–111)
Creatinine, Ser: 0.93 mg/dL (ref 0.44–1.00)
GFR calc Af Amer: >60 mL/min (ref >60)
GFR calc non Af Amer: >60 mL/min (ref >60)
Glucose, Bld: 101 mg/dL — ABNORMAL HIGH (ref 70–99)
Potassium: 3.6 mmol/L (ref 3.5–5.1)
Sodium: 138 mmol/L (ref 135–145)
Total Bilirubin: 0.4 mg/dL (ref 0.3–1.2)
Total Protein: 6.6 g/dL (ref 6.5–8.1)

## 2020-04-28 LAB — CBC WITH DIFFERENTIAL/PLATELET
Abs Immature Granulocytes: 0.21 10*3/uL — ABNORMAL HIGH (ref 0.00–0.07)
Basophils Absolute: 0.1 10*3/uL (ref 0.0–0.1)
Basophils Relative: 2 %
Eosinophils Absolute: 0.1 10*3/uL (ref 0.0–0.5)
Eosinophils Relative: 2 %
HCT: 30.2 % — ABNORMAL LOW (ref 36.0–46.0)
Hemoglobin: 9.2 g/dL — ABNORMAL LOW (ref 12.0–15.0)
Immature Granulocytes: 4 %
Lymphocytes Relative: 28 %
Lymphs Abs: 1.5 10*3/uL (ref 0.7–4.0)
MCH: 30 pg (ref 26.0–34.0)
MCHC: 30.5 g/dL (ref 30.0–36.0)
MCV: 98.4 fL (ref 80.0–100.0)
Monocytes Absolute: 0.4 10*3/uL (ref 0.1–1.0)
Monocytes Relative: 7 %
Neutro Abs: 3.2 10*3/uL (ref 1.7–7.7)
Neutrophils Relative %: 57 %
Platelets: 239 10*3/uL (ref 150–400)
RBC: 3.07 MIL/uL — ABNORMAL LOW (ref 3.87–5.11)
RDW: 18.5 % — ABNORMAL HIGH (ref 11.5–15.5)
WBC: 5.4 10*3/uL (ref 4.0–10.5)
nRBC: 2 % — ABNORMAL HIGH (ref 0.0–0.2)

## 2020-04-28 LAB — FERRITIN: Ferritin: 20 ng/mL (ref 11–307)

## 2020-04-28 LAB — MAGNESIUM: Magnesium: 2 mg/dL (ref 1.7–2.4)

## 2020-04-28 LAB — IRON AND TIBC
Iron: 46 ug/dL (ref 28–170)
Saturation Ratios: 13 % (ref 10.4–31.8)
TIBC: 365 ug/dL (ref 250–450)
UIBC: 319 ug/dL

## 2020-04-28 LAB — URIC ACID: Uric Acid, Serum: 5.4 mg/dL (ref 2.5–7.1)

## 2020-04-28 LAB — PHOSPHORUS: Phosphorus: 3.2 mg/dL (ref 2.5–4.6)

## 2020-04-28 NOTE — Progress Notes (Signed)
[place], [place] [address]   CLINIC:  Medical Oncology/Hematology  PCP:  Fayrene Helper, MD [address] [place] / [place] [address]  [phone]  REASON FOR VISIT:  Follow-up for CML  CURRENT THERAPY: Dasatinib  INTERVAL HISTORY:  [name], a 44 y.o. female, returns for routine follow-up for her CML. Marcia Hahn was last seen on 04/15/2020.  Today she reports that she is doing well and tolerating the dasatinib well after taking it at bedtime. She denies having any nausea. She is taking the allopurinol and losartan daily. She denies any aches or pain. She reports that her hair is brittle and her skin is drier than normal.  She has gone back to work and is tolerating it well.   REVIEW OF SYSTEMS:  Review of Systems  Constitutional: Negative for appetite change and fatigue.  Gastrointestinal: Negative for nausea.  Musculoskeletal: Negative for arthralgias and myalgias.  All other systems reviewed and are negative.   PAST MEDICAL/SURGICAL HISTORY:  Past Medical History:  Diagnosis Date  . Acne vulgaris   . Hypertension   . Mild obesity    Past Surgical History:  Procedure Laterality Date  . BREAST BIOPSY Left   . CESAREAN SECTION      SOCIAL HISTORY:  Social History   Socioeconomic History  . Marital status: Single    Spouse name: Not on file  . Number of children: 2  . Years of education: Not on file  . Highest education level: Not on file  Occupational History  . Occupation: Psychologist, educational   Tobacco Use  . Smoking status: Former Smoker    Types: Cigarettes    Quit date: 12/04/2012    Years since quitting: 7.4  . Smokeless tobacco: Never Used  Vaping Use  . Vaping Use: Never used  Substance and Sexual Activity  . Alcohol use: Yes    Comment: occasional  . Drug use: No  . Sexual activity: Yes    Birth control/protection: I.U.D.  Other Topics Concern  . Not on file  Social History Narrative    Mother of twins    Social Determinants of Health   Financial Resource Strain:   . Difficulty of Paying Living Expenses:   Food Insecurity:   . Worried About Charity fundraiser in the Last Year:   . Arboriculturist in the Last Year:   Transportation Needs:   . Film/video editor (Medical):   Marland Kitchen Lack of Transportation (Non-Medical):   Physical Activity:   . Days of Exercise per Week:   . Minutes of Exercise per Session:   Stress:   . Feeling of Stress :   Social Connections:   . Frequency of Communication with Friends and Family:   . Frequency of Social Gatherings with Friends and Family:   . Attends Religious Services:   . Active Member of Clubs or Organizations:   . Attends Archivist Meetings:   Marland Kitchen Marital Status:   Intimate Partner Violence:   . Fear of Current or Ex-Partner:   . Emotionally Abused:   Marland Kitchen Physically Abused:   . Sexually Abused:     FAMILY HISTORY:  Family History  Problem Relation Age of Onset  . GER disease Mother   . Diabetes Mother   . Hypertension Mother   . Heart attack Father 7  . Heart disease Father   . Migraines Sister   . Breast cancer Maternal Grandmother   .  Stroke Maternal Grandmother   . Breast cancer Cousin   . Stroke Maternal Aunt     CURRENT MEDICATIONS:  Current Outpatient Medications  Medication Sig Dispense Refill  . allopurinol (ZYLOPRIM) 300 MG tablet Take 1 tablet (300 mg total) by mouth 2 (two) times daily. 60 tablet 2  . dasatinib (SPRYCEL) 70 MG tablet Take 1 tablet (70 mg total) by mouth daily. 30 tablet 0  . ergocalciferol (VITAMIN D2) 1.25 MG (50000 UT) capsule Take 1 capsule (50,000 Units total) by mouth once a week. One capsule once weekly 12 capsule 2  . ferrous sulfate (FEROSUL) 325 (65 FE) MG tablet Take 1 tablet (325 mg total) by mouth 2 (two) times daily with a meal. 60 tablet 3  . losartan (COZAAR) 50 MG tablet Take 1 tablet (50 mg total) by mouth daily. 90 tablet 3  . Multiple Vitamin  (MULTIVITAMIN WITH MINERALS) TABS tablet Take 1 tablet by mouth daily. 30 tablet 3  . spironolactone (ALDACTONE) 25 MG tablet Take 0.5 tablets (12.5 mg total) by mouth daily. 45 tablet 3   No current facility-administered medications for this visit.    ALLERGIES:  Allergies  Allergen Reactions  . Amlodipine     Headache   . Tomato     Processed tomatoes in the can.     PHYSICAL EXAM:  Performance status (ECOG): 0 - Asymptomatic  Vitals:   04/28/20 0928  BP: 127/71  Pulse: 67  Resp: 18  Temp: 98.5 F (36.9 C)  SpO2: 100%   Wt Readings from Last 3 Encounters:  04/28/20 202 lb 1.6 oz (91.7 kg)  04/15/20 203 lb 12.8 oz (92.4 kg)  03/31/20 201 lb 14.4 oz (91.6 kg)   Physical Exam Vitals reviewed.  Constitutional:      Appearance: Normal appearance. She is obese.  Cardiovascular:     Rate and Rhythm: Normal rate and regular rhythm.     Pulses: Normal pulses.     Heart sounds: Normal heart sounds.  Pulmonary:     Effort: Pulmonary effort is normal.     Breath sounds: Normal breath sounds.  Abdominal:     Palpations: Abdomen is soft. There is no hepatomegaly, splenomegaly or mass.     Tenderness: There is no abdominal tenderness.     Hernia: No hernia is present.  Neurological:     General: No focal deficit present.     Mental Status: She is alert and oriented to person, place, and time.  Psychiatric:        Mood and Affect: Mood normal.        Behavior: Behavior normal.     LABORATORY DATA:  I have reviewed the labs as listed.  CBC Latest Ref Rng & Units 04/28/2020 04/15/2020 03/31/2020  WBC 4.0 - 10.5 K/uL 5.4 38.2(H) 156.1(HH)  Hemoglobin 12.0 - 15.0 g/dL 9.2(L) 8.0(L) 8.3(L)  Hematocrit 36 - 46 % 30.2(L) 26.1(L) 26.3(L)  Platelets 150 - 400 K/uL 239 146(L) 180   CMP Latest Ref Rng & Units 04/15/2020 03/31/2020 03/08/2020  Glucose 70 - 99 mg/dL 111(H) 102(H) 109(H)  BUN 6 - 20 mg/dL '16 16 13  ' Creatinine 0.44 - 1.00 mg/dL 0.90 1.09(H) 0.86  Sodium 135 - 145  mmol/L 138 136 138  Potassium 3.5 - 5.1 mmol/L 3.6 3.6 3.3(L)  Chloride 98 - 111 mmol/L 107 104 107  CO2 22 - 32 mmol/L '23 24 24  ' Calcium 8.9 - 10.3 mg/dL 8.5(L) 8.9 8.5(L)  Total Protein 6.5 - 8.1 g/dL 6.7  7.2 6.8  Total Bilirubin 0.3 - 1.2 mg/dL 0.3 0.6 0.6  Alkaline Phos 38 - 126 U/L 43 55 47  AST 15 - 41 U/L 23 33 31  ALT 0 - 44 U/L '28 27 31      ' Component Value Date/Time   RBC 3.07 (L) 04/28/2020 0913   MCV 98.4 04/28/2020 0913   MCH 30.0 04/28/2020 0913   MCHC 30.5 04/28/2020 0913   RDW 18.5 (H) 04/28/2020 0913   LYMPHSABS 1.5 04/28/2020 0913   MONOABS 0.4 04/28/2020 0913   EOSABS 0.1 04/28/2020 0913   BASOSABS 0.1 04/28/2020 0913   Lab Results  Component Value Date   FERRITIN 13 (L) 04/01/2019   FERRITIN 8 (L) 09/16/2018   FERRITIN 5 (L) 06/11/2017    DIAGNOSTIC IMAGING:  I have independently reviewed the scans and discussed with the patient. No results found.   ASSESSMENT:  1. CML in chronic phase: -Presentation with hyperleukocytosis.  BCR/ABL by FISH positive. -CT chest PE protocol showed splenomegaly measuring 14 cm. -BMBX on 02/23/2020 shows hypercellular marrow with myeloid hyperplasia consistent with CML.  No increase in blasts.  Flow cytometry less than 1% blasts.  Chromosome analysis shows Philadelphia chromosome. -Dasatinib 70 mg daily started on 04/01/2020.   PLAN:  1. CML in chronic phase: -She is taking dasatinib 70 mg at bedtime to counteract nausea. -She is tolerating it very well. -I reviewed her labs today.  White count improved to 5.4.  Hemoglobin improved to 9.2 from 8.0.  Platelets are 239.  Neutrophils are 57% and lymphocytes of 28%.  Basophils are 2%. -I plan to see her back in 4 weeks for follow-up.  She will continue at the same dose level.  2. Tumor lysis syndrome: -Uric acid today is 5.4.  Continue allopurinol once daily.  3. Hypertension: -Continue losartan 50 mg daily.  Blood pressure today is 127/71.  4. Normocytic  anemia: -Hemoglobin improved to 9.2.  Likely from bone marrow infiltration tenia. -We will check her ferritin and iron panel.  If it is low will consider iron therapy.  Orders placed this encounter:  No orders of the defined types were placed in this encounter.    Derek Jack, MD [place] [phone]   I, Milinda Antis, am acting as a scribe for Dr. Sanda Linger.  I, Derek Jack MD, have reviewed the above documentation for accuracy and completeness, and I agree with the above.

## 2020-04-28 NOTE — Patient Instructions (Signed)
[place] at [facility] Discharge Instructions  You were seen today by Dr. Delton Coombes. He went over your recent results. Continue your routine care with your primary care provider. Dr. Delton Coombes will see you back in 4 weeks for labs and follow up.   Thank you for choosing [place] at [facility] to provide your oncology and hematology care.  To afford each patient quality time with our provider, please arrive at least 15 minutes before your scheduled appointment time.   If you have a lab appointment with the [facility] please come in thru the Main Entrance and check in at the main information desk  You need to re-schedule your appointment should you arrive 10 or more minutes late.  We strive to give you quality time with our providers, and arriving late affects you and other patients whose appointments are after yours.  Also, if you no show three or more times for appointments you may be dismissed from the clinic at the providers discretion.     Again, thank you for choosing [facility].  Our hope is that these requests will decrease the amount of time that you wait before being seen by our physicians.       _____________________________________________________________  Should you have questions after your visit to [facility], please contact our office at [phone] between the hours of 8:00 a.m. and 4:30 p.m.  Voicemails left after 4:00 p.m. will not be returned until the following business day.  For prescription refill requests, have your pharmacy contact our office and allow 72 hours.    Cancer Center Support Programs:   > Cancer Support Group  2nd Tuesday of the month 1pm-2pm, Journey Room

## 2020-04-29 ENCOUNTER — Ambulatory Visit: Payer: BC Managed Care – PPO | Admitting: Internal Medicine

## 2020-05-02 ENCOUNTER — Ambulatory Visit (INDEPENDENT_AMBULATORY_CARE_PROVIDER_SITE_OTHER): Payer: BC Managed Care – PPO | Admitting: Family Medicine

## 2020-05-02 ENCOUNTER — Encounter: Payer: Self-pay | Admitting: Family Medicine

## 2020-05-02 ENCOUNTER — Other Ambulatory Visit: Payer: Self-pay

## 2020-05-02 VITALS — BP 117/76 | HR 86 | Temp 99.3°F | Resp 16 | Ht 66.0 in | Wt 204.4 lb

## 2020-05-02 DIAGNOSIS — E785 Hyperlipidemia, unspecified: Secondary | ICD-10-CM

## 2020-05-02 DIAGNOSIS — E8881 Metabolic syndrome: Secondary | ICD-10-CM

## 2020-05-02 DIAGNOSIS — R221 Localized swelling, mass and lump, neck: Secondary | ICD-10-CM

## 2020-05-02 DIAGNOSIS — C9210 Chronic myeloid leukemia, BCR/ABL-positive, not having achieved remission: Secondary | ICD-10-CM

## 2020-05-02 DIAGNOSIS — I10 Essential (primary) hypertension: Secondary | ICD-10-CM

## 2020-05-02 DIAGNOSIS — Z23 Encounter for immunization: Secondary | ICD-10-CM | POA: Diagnosis not present

## 2020-05-02 DIAGNOSIS — E559 Vitamin D deficiency, unspecified: Secondary | ICD-10-CM

## 2020-05-02 DIAGNOSIS — E669 Obesity, unspecified: Secondary | ICD-10-CM

## 2020-05-02 HISTORY — DX: Localized swelling, mass and lump, neck: R22.1

## 2020-05-02 NOTE — Patient Instructions (Signed)
Annual physical exam with MD, no pap, in early Novemebr  It is important that you exercise regularly at least 30 minutes 5 times a week. If you develop chest pain, have severe difficulty breathing, or feel very tired, stop exercising immediately and seek medical attention   Please get fasting lipid, TSh and vit D same day as next lab draw for Oncology ( 3 MONTHS)   YOU ARE REFERRED TO GEN SURGERY RE posterior neck mass, we will provide you with appt info ( Ok to lv message on personal cell)  TdAP today  Please reconsider Covid vaccine  Thanks for choosing [facility], we consider it a privelige to serve you.

## 2020-05-02 NOTE — Assessment & Plan Note (Addendum)
2 week h/o painful swelling on posterior neck, no drainage. Diameter apprtrox 4.5  cm, refer surgery C.linical impression is that it is a sebaceous cyst, unable to extrude any material with pressure

## 2020-05-02 NOTE — Assessment & Plan Note (Signed)
Controlled, no change in medication DASH diet and commitment to daily physical activity for a minimum of 30 minutes discussed and encouraged, as a part of hypertension management. The importance of attaining a healthy weight is also discussed.  BP/Weight 05/02/2020 04/28/2020 04/15/2020 03/31/2020 03/08/2020 02/23/2020 4/37/0052  Systolic BP 591 028 902 284 069 861 483  Diastolic BP 76 71 57 60 75 108 97  Wt. (Lbs) 204.4 202.1 203.8 201.9 203.5 - 204  BMI 32.99 31.65 31.92 31.62 31.87 - 31.95

## 2020-05-02 NOTE — Progress Notes (Signed)
Marcia Hahn     MRN: [id number]      DOB: 18-Apr-1976   HPI Marcia Hahn is here with a 2 week h/o painful swelling on nape, unable to extrude material, no drainag, no fever ROS Denies recent fever or chills. Denies sinus pressure, nasal congestion, ear pain or sore throat. Denies chest congestion, productive cough or wheezing. Denies chest pains, palpitations and leg swelling Denies abdominal pain, nausea, vomiting,diarrhea or constipation.   Denies dysuria, frequency, hesitancy or incontinence. Denies joint pain, swelling and limitation in mobility. Denies headaches, seizures, numbness, or tingling. Denies depression, anxiety or insomnia.  PE  BP 117/76   Pulse 86   Temp 99.3 F (37.4 C) (Temporal)   Resp 16   Ht 5\' 6"  (1.676 m)   Wt 204 lb 6.4 oz (92.7 kg)   SpO2 98%   BMI 32.99 kg/m   Patient alert and oriented and in no cardiopulmonary distress.  HEENT: No facial asymmetry, EOMI,     Neck supple .  Chest: Clear to auscultation bilaterally.  CVS: S1, S2 no murmurs, no S3.Regular rate.  ABD: Soft non tender.   Ext: No edema  MS: Adequate ROM spine, shoulders, hips and knees.  Skin: Intact, cystic mass at nape of neck , deep to bone, max diameter approx 4.5 cm, no material can be extruded with pressure although it appears to be a sebaceous cyst. Not infected. No surrounding erythema or warmth  Psych: Good eye contact, normal affect. Memory intact not anxious or depressed appearing.  CNS: CN 2-12 intact, power,  normal throughout.no focal deficits noted.   Assessment & Plan  Benign essential HTN Controlled, no change in medication DASH diet and commitment to daily physical activity for a minimum of 30 minutes discussed and encouraged, as a part of hypertension management. The importance of attaining a healthy weight is also discussed.  BP/Weight 05/02/2020 04/28/2020 04/15/2020 03/31/2020 03/08/2020 02/23/2020 0/62/6948  Systolic BP 546 270 350 093 130 818 299   Diastolic BP 76 71 57 60 75 108 97  Wt. (Lbs) 204.4 202.1 203.8 201.9 203.5 - 204  BMI 32.99 31.65 31.92 31.62 31.87 - 31.95       Localized swelling, mass and lump, neck 2 week h/o painful swelling on posterior neck, no drainage. Diameter apprtrox 4.5  cm, refer surgery C.linical impression is that it is a sebaceous cyst, unable to extrude any material with pressure  CML (chronic myelocytic leukemia) (HCC) Good response to treatment and tolerates medication well. Oncology managing  Obesity (BMI 30.0-34.9)  Patient re-educated about  the importance of commitment to a  minimum of 150 minutes of exercise per week as able.  The importance of healthy food choices with portion control discussed, as well as eating regularly and within a 12 hour window most days. The need to choose "clean , green" food 50 to 75% of the time is discussed, as well as to make water the primary drink and set a goal of 64 ounces water daily.    Weight /BMI 05/05/2020 05/02/2020 04/28/2020  WEIGHT 204 lb 204 lb 6.4 oz 202 lb 1.6 oz  HEIGHT 5\' 6"  5\' 6"  -  BMI 32.93 kg/m2 32.99 kg/m2 31.65 kg/m2      Need for Tdap vaccination After obtaining informed consent, the vaccine is  administered , with no adverse effect noted at the time of administration.   Dyslipidemia (high LDL; low HDL) Hyperlipidemia:Low fat diet discussed and encouraged.   Lipid Panel  Lab Results  Component Value Date   CHOL 145 10/07/2019   HDL 34 (L) 10/07/2019   LDLCALC 85 10/07/2019   TRIG 165 (H) 10/07/2019   CHOLHDL 4.3 10/07/2019    Updated lab needed at/ before next visit.

## 2020-05-05 ENCOUNTER — Encounter: Payer: Self-pay | Admitting: General Surgery

## 2020-05-05 ENCOUNTER — Other Ambulatory Visit: Payer: Self-pay

## 2020-05-05 ENCOUNTER — Ambulatory Visit (INDEPENDENT_AMBULATORY_CARE_PROVIDER_SITE_OTHER): Payer: BC Managed Care – PPO | Admitting: General Surgery

## 2020-05-05 VITALS — BP 118/75 | HR 60 | Temp 97.6°F | Resp 14 | Ht 66.0 in | Wt 204.0 lb

## 2020-05-05 DIAGNOSIS — L723 Sebaceous cyst: Secondary | ICD-10-CM | POA: Diagnosis not present

## 2020-05-05 MED ORDER — SULFAMETHOXAZOLE-TRIMETHOPRIM 400-80 MG PO TABS: 1.0000 | ORAL_TABLET | Freq: Two times a day (BID) | ORAL | 0 refills | Status: DC

## 2020-05-05 NOTE — Patient Instructions (Signed)
Epidermal Cyst  An epidermal cyst is a small, painless lump under your skin. The cyst contains a grayish-white, bad-smelling substance (keratin). Do not try to pop or open an epidermal cyst yourself. What are the causes?  A blocked hair follicle.  A hair that curls and re-enters the skin instead of growing straight out of the skin.  A blocked pore.  Irritated skin.  An injury to the skin.  Certain conditions that are passed along from parent to child (inherited).  Human papillomavirus (HPV).  Long-term sun damage to the skin. What increases the risk?  Having acne.  Being overweight.  Being 30-40 years old. What are the signs or symptoms? These cysts are usually harmless, but they can get infected. Symptoms of infection may include:  Redness.  Inflammation.  Tenderness.  Warmth.  Fever.  A grayish-white, bad-smelling substance drains from the cyst.  Pus drains from the cyst. How is this treated? In many cases, epidermal cysts go away on their own without treatment. If a cyst becomes infected, treatment may include:  Opening and draining the cyst, done by a doctor. After draining, you may need minor surgery to remove the rest of the cyst.  Antibiotic medicine.  Shots of medicines (steroids) that help to reduce inflammation.  Surgery to remove the cyst. Surgery may be done if the cyst: ? Becomes large. ? Bothers you. ? Has a chance of turning into cancer.  Do not try to open a cyst yourself. Follow these instructions at home:  Take over-the-counter and prescription medicines only as told by your doctor.  If you were prescribed an antibiotic medicine, take it it as told by your doctor. Do not stop using the antibiotic even if you start to feel better.  Keep the area around your cyst clean and dry.  Wear loose, dry clothing.  Avoid touching your cyst.  Check your cyst every day for signs of infection. Check for: ? Redness, swelling, or pain. ? Fluid  or blood. ? Warmth. ? Pus or a bad smell.  Keep all follow-up visits as told by your doctor. This is important. How is this prevented?  Wear clean, dry, clothing.  Avoid wearing tight clothing.  Keep your skin clean and dry. Take showers or baths every day. Contact a doctor if:  Your cyst has symptoms of infection.  Your condition does not improve or gets worse.  You have a cyst that looks different from other cysts you have had.  You have a fever. Get help right away if:  Redness spreads from the cyst into the area close by. Summary  An epidermal cyst is a sac made of skin tissue.  If a cyst becomes infected, treatment may include surgery to open and drain the cyst, or to remove it.  Take over-the-counter and prescription medicines only as told by your doctor.  Contact a doctor if your condition is not improving or is getting worse.  Keep all follow-up visits as told by your doctor. This is important. This information is not intended to replace advice given to you by your health care provider. Make sure you discuss any questions you have with your health care provider. Document Revised: 02/19/2019 Document Reviewed: 08/07/2018 Elsevier Patient Education  2020 Elsevier Inc.  

## 2020-05-06 NOTE — Progress Notes (Signed)
Marcia Hahn; [id number]; [birth year]   HPI Patient is a 44yo BF who was referred to my care by Dr. Moshe Cipro for evaluation and treatment of a cyst on the back of her neck.  Patient states it has been swollen and tender over the past 2 or 3 weeks.  She reports that her primary care physician has stated has been present for some time.  She has had no drainage.  She denies any fever or chills.  She currently has 3 out of 10 pain when the area is palpated. Past Medical History:  Diagnosis Date  . Acne vulgaris   . Hypertension   . Mild obesity     Past Surgical History:  Procedure Laterality Date  . BREAST BIOPSY Left   . CESAREAN SECTION      Family History  Problem Relation Age of Onset  . GER disease Mother   . Diabetes Mother   . Hypertension Mother   . Heart attack Father 3  . Heart disease Father   . Migraines Sister   . Breast cancer Maternal Grandmother   . Stroke Maternal Grandmother   . Breast cancer Cousin   . Stroke Maternal Aunt     Current Outpatient Medications on File Prior to Visit  Medication Sig Dispense Refill  . allopurinol (ZYLOPRIM) 300 MG tablet Take 1 tablet (300 mg total) by mouth 2 (two) times daily. 60 tablet 2  . dasatinib (SPRYCEL) 70 MG tablet Take 1 tablet (70 mg total) by mouth daily. 30 tablet 0  . ergocalciferol (VITAMIN D2) 1.25 MG (50000 UT) capsule Take 1 capsule (50,000 Units total) by mouth once a week. One capsule once weekly 12 capsule 2  . ferrous sulfate (FEROSUL) 325 (65 FE) MG tablet Take 1 tablet (325 mg total) by mouth 2 (two) times daily with a meal. 60 tablet 3  . losartan (COZAAR) 50 MG tablet Take 1 tablet (50 mg total) by mouth daily. 90 tablet 3  . Multiple Vitamin (MULTIVITAMIN WITH MINERALS) TABS tablet Take 1 tablet by mouth daily. 30 tablet 3  . spironolactone (ALDACTONE) 25 MG tablet Take 0.5 tablets (12.5 mg total) by mouth daily. 45 tablet 3   No current facility-administered medications on file prior to visit.     Allergies  Allergen Reactions  . Amlodipine     Headache   . Tomato     Processed tomatoes in the can.     Social History   Substance and Sexual Activity  Alcohol Use Yes   Comment: occasional    Social History   Tobacco Use  Smoking Status Former Smoker  . Types: Cigarettes  . Quit date: 12/04/2012  . Years since quitting: 7.4  Smokeless Tobacco Never Used    Review of Systems  Constitutional: Negative.   HENT: Negative.   Eyes: Negative.   Respiratory: Negative.   Cardiovascular: Negative.   Gastrointestinal: Negative.   Genitourinary: Negative.   Musculoskeletal: Negative.   Skin: Negative.   Neurological: Negative.   Endo/Heme/Allergies: Negative.   Psychiatric/Behavioral: Negative.     Objective   Vitals:   05/05/20 1224  BP: 118/75  Pulse: 60  Resp: 14  Temp: 97.6 F (36.4 C)  SpO2: 99%    Physical Exam Vitals reviewed.  Constitutional:      Appearance: Normal appearance. She is not ill-appearing.  HENT:     Head: Normocephalic and atraumatic.  Neck:     Comments: 3 cm hard oval subcutaneous mass with induration and tenderness present.  It appears to be subcutaneous in nature.  A punctum is present.  No drainage is noted. Cardiovascular:     Rate and Rhythm: Normal rate and regular rhythm.     Heart sounds: Normal heart sounds. No murmur heard.  No friction rub. No gallop.   Pulmonary:     Effort: Pulmonary effort is normal. No respiratory distress.     Breath sounds: Normal breath sounds. No stridor. No wheezing, rhonchi or rales.  Musculoskeletal:     Cervical back: Normal range of motion and neck supple. Tenderness present.  Skin:    General: Skin is warm and dry.  Neurological:     Mental Status: She is alert and oriented to person, place, and time.   Primary care notes reviewed  Assessment  Infected sebaceous cyst, neck Plan   Patient is being started on Septra 1 tablet p.o. twice daily x10 days.  We will see the patient  back again in 2 weeks to possibly schedule formal excision.

## 2020-05-07 ENCOUNTER — Encounter: Payer: Self-pay | Admitting: Family Medicine

## 2020-05-07 DIAGNOSIS — Z23 Encounter for immunization: Secondary | ICD-10-CM | POA: Insufficient documentation

## 2020-05-07 NOTE — Assessment & Plan Note (Signed)
Good response to treatment and tolerates medication well. Oncology managing

## 2020-05-07 NOTE — Assessment & Plan Note (Signed)
Hyperlipidemia:Low fat diet discussed and encouraged.   Lipid Panel  Lab Results  Component Value Date   CHOL 145 10/07/2019   HDL 34 (L) 10/07/2019   LDLCALC 85 10/07/2019   TRIG 165 (H) 10/07/2019   CHOLHDL 4.3 10/07/2019    Updated lab needed at/ before next visit.

## 2020-05-07 NOTE — Assessment & Plan Note (Signed)
After obtaining informed consent, the vaccine is  administered , with no adverse effect noted at the time of administration.  

## 2020-05-07 NOTE — Assessment & Plan Note (Signed)
  Patient re-educated about  the importance of commitment to a  minimum of 150 minutes of exercise per week as able.  The importance of healthy food choices with portion control discussed, as well as eating regularly and within a 12 hour window most days. The need to choose "clean , green" food 50 to 75% of the time is discussed, as well as to make water the primary drink and set a goal of 64 ounces water daily.    Weight /BMI 05/05/2020 05/02/2020 04/28/2020  WEIGHT 204 lb 204 lb 6.4 oz 202 lb 1.6 oz  HEIGHT 5\' 6"  5\' 6"  -  BMI 32.93 kg/m2 32.99 kg/m2 31.65 kg/m2

## 2020-05-19 ENCOUNTER — Encounter: Payer: Self-pay | Admitting: General Surgery

## 2020-05-19 ENCOUNTER — Ambulatory Visit (INDEPENDENT_AMBULATORY_CARE_PROVIDER_SITE_OTHER): Payer: BC Managed Care – PPO | Admitting: General Surgery

## 2020-05-19 ENCOUNTER — Other Ambulatory Visit: Payer: Self-pay

## 2020-05-19 VITALS — BP 103/68 | HR 72 | Temp 97.2°F | Resp 16 | Ht 65.0 in | Wt 204.0 lb

## 2020-05-19 DIAGNOSIS — L723 Sebaceous cyst: Secondary | ICD-10-CM | POA: Diagnosis not present

## 2020-05-19 NOTE — Progress Notes (Signed)
Subjective:     Marcia Hahn  Here for follow-up of sebaceous cyst on her neck.  She took the antibiotics and the cyst has since resolved.  It is nontender. Objective:    BP 103/68   Pulse 72   Temp (!) 97.2 F (36.2 C) (Temporal)   Resp 16   Ht 5\' 5"  (1.651 m)   Wt 204 lb (92.5 kg)   SpO2 96%   BMI 33.95 kg/m   General:  alert, cooperative and no distress  Induration and erythema from the sebaceous cyst on the back of her neck has resolved.  No significant cystic structure is left at this time.     Assessment:    Sebaceous cyst, neck, resolved    Plan:   No need for formal excision at this time, which is fine with the patient.  She was instructed to return should the cyst recur.  Follow-up as needed.

## 2020-06-01 ENCOUNTER — Inpatient Hospital Stay (HOSPITAL_COMMUNITY): Payer: BC Managed Care – PPO

## 2020-06-01 ENCOUNTER — Other Ambulatory Visit: Payer: Self-pay

## 2020-06-01 ENCOUNTER — Inpatient Hospital Stay (HOSPITAL_COMMUNITY): Payer: BC Managed Care – PPO | Attending: Hematology | Admitting: Hematology

## 2020-06-01 VITALS — BP 124/75 | HR 63 | Temp 97.0°F | Resp 18 | Wt 206.3 lb

## 2020-06-01 DIAGNOSIS — C9210 Chronic myeloid leukemia, BCR/ABL-positive, not having achieved remission: Secondary | ICD-10-CM

## 2020-06-01 DIAGNOSIS — Z87891 Personal history of nicotine dependence: Secondary | ICD-10-CM | POA: Diagnosis not present

## 2020-06-01 DIAGNOSIS — E883 Tumor lysis syndrome: Secondary | ICD-10-CM | POA: Diagnosis not present

## 2020-06-01 DIAGNOSIS — D649 Anemia, unspecified: Secondary | ICD-10-CM | POA: Insufficient documentation

## 2020-06-01 DIAGNOSIS — I10 Essential (primary) hypertension: Secondary | ICD-10-CM | POA: Diagnosis not present

## 2020-06-01 LAB — COMPREHENSIVE METABOLIC PANEL
ALT: 23 U/L (ref 0–44)
AST: 20 U/L (ref 15–41)
Albumin: 4.2 g/dL (ref 3.5–5.0)
Alkaline Phosphatase: 47 U/L (ref 38–126)
Anion gap: 10 (ref 5–15)
BUN: 14 mg/dL (ref 6–20)
CO2: 22 mmol/L (ref 22–32)
Calcium: 8.6 mg/dL — ABNORMAL LOW (ref 8.9–10.3)
Chloride: 106 mmol/L (ref 98–111)
Creatinine, Ser: 0.97 mg/dL (ref 0.44–1.00)
GFR calc Af Amer: >60 mL/min (ref >60)
GFR calc non Af Amer: >60 mL/min (ref >60)
Glucose, Bld: 113 mg/dL — ABNORMAL HIGH (ref 70–99)
Potassium: 3.9 mmol/L (ref 3.5–5.1)
Sodium: 138 mmol/L (ref 135–145)
Total Bilirubin: 0.6 mg/dL (ref 0.3–1.2)
Total Protein: 7.3 g/dL (ref 6.5–8.1)

## 2020-06-01 LAB — CBC WITH DIFFERENTIAL/PLATELET
Abs Immature Granulocytes: 0.01 10*3/uL (ref 0.00–0.07)
Basophils Absolute: 0 10*3/uL (ref 0.0–0.1)
Basophils Relative: 1 %
Eosinophils Absolute: 0.1 10*3/uL (ref 0.0–0.5)
Eosinophils Relative: 1 %
HCT: 35.8 % — ABNORMAL LOW (ref 36.0–46.0)
Hemoglobin: 11.4 g/dL — ABNORMAL LOW (ref 12.0–15.0)
Immature Granulocytes: 0 %
Lymphocytes Relative: 30 %
Lymphs Abs: 1.5 10*3/uL (ref 0.7–4.0)
MCH: 30.1 pg (ref 26.0–34.0)
MCHC: 31.8 g/dL (ref 30.0–36.0)
MCV: 94.5 fL (ref 80.0–100.0)
Monocytes Absolute: 0.2 10*3/uL (ref 0.1–1.0)
Monocytes Relative: 4 %
Neutro Abs: 3.2 10*3/uL (ref 1.7–7.7)
Neutrophils Relative %: 64 %
Platelets: 236 10*3/uL (ref 150–400)
RBC: 3.79 MIL/uL — ABNORMAL LOW (ref 3.87–5.11)
RDW: 15.4 % (ref 11.5–15.5)
WBC: 4.9 10*3/uL (ref 4.0–10.5)
nRBC: 0 % (ref 0.0–0.2)

## 2020-06-01 LAB — LACTATE DEHYDROGENASE: LDH: 147 U/L (ref 98–192)

## 2020-06-01 LAB — URIC ACID: Uric Acid, Serum: 4.1 mg/dL (ref 2.5–7.1)

## 2020-06-01 NOTE — Patient Instructions (Signed)
[place] at [facility] Discharge Instructions  You were seen today by Dr. Delton Coombes. He went over your recent results. Finish your current bottle of allopurinol and then stop taking it. Continue taking your iron tablets twice daily until your hemoglobin reaches 12. Be vigilant for any symptoms of shortness of breath and notify the office immediately if symptoms develop. Dr. Delton Coombes will see you back in 5 weeks for labs and follow up.   Thank you for choosing [place] at [facility] to provide your oncology and hematology care.  To afford each patient quality time with our provider, please arrive at least 15 minutes before your scheduled appointment time.   If you have a lab appointment with the [facility] please come in thru the Main Entrance and check in at the main information desk  You need to re-schedule your appointment should you arrive 10 or more minutes late.  We strive to give you quality time with our providers, and arriving late affects you and other patients whose appointments are after yours.  Also, if you no show three or more times for appointments you may be dismissed from the clinic at the providers discretion.     Again, thank you for choosing [facility]  Our hope is that these requests will decrease the amount of time that you wait before being seen by our physicians.       _____________________________________________________________  Should you have questions after your visit to [facility], please contact our office at [phone] between the hours of 8:00 a.m. and 4:30 p.m.  Voicemails left after 4:00 p.m. will not be returned until the following business day.  For prescription refill requests, have your pharmacy contact our office and allow 72 hours.    Cancer Center Support Programs:   > Cancer Support Group  2nd Tuesday of the month 1pm-2pm, Journey Room

## 2020-06-01 NOTE — Progress Notes (Signed)
° °[facility] °[address] °[place], [place] [address] ° ° °CLINIC:  °Medical Oncology/Hematology ° °PCP:  °Hahn, Margaret E, MD °[address] / [place] [address]  °[phone] ° °REASON FOR VISIT:  °Follow-up for CML ° °CURRENT THERAPY: Dasatinib ° °INTERVAL HISTORY:  °Marcia Hahn, a 44 y.o. female, returns for routine follow-up for her CML. Marcia Hahn was last seen on 04/28/2020. ° °Today she reports that she is taking iron tablets twice daily; she denies constipation. Her nausea has resolved since taking the dasatinib every night. She is menstruating. ° °She has returned to work and works during the day. ° ° °REVIEW OF SYSTEMS:  °Review of Systems  °Constitutional: Positive for appetite change (mildly decreased) and fatigue (mild). Negative for chills and fever.  °Cardiovascular: Negative for leg swelling.  °Gastrointestinal: Negative for constipation and nausea.  °Skin: Negative for rash.  °Neurological: Negative for numbness.  °All other systems reviewed and are negative. ° ° °PAST MEDICAL/SURGICAL HISTORY:  °Past Medical History:  °Diagnosis Date  °• Abnormal mammogram of right breast 09/03/2018  °• Acne vulgaris   °• Hypertension   °• Mild obesity   ° °Past Surgical History:  °Procedure Laterality Date  °• BREAST BIOPSY Left   °• CESAREAN SECTION    ° ° °SOCIAL HISTORY:  °Social History  ° °Socioeconomic History  °• Marital status: Single  °  Spouse name: Not on file  °• Number of children: 2  °• Years of education: Not on file  °• Highest education level: Not on file  °Occupational History  °• Occupation: manufacturing   °Tobacco Use  °• Smoking status: Former Smoker  °  Types: Cigarettes  °  Quit date: 12/04/2012  °  Years since quitting: 7.4  °• Smokeless tobacco: Never Used  °Vaping Use  °• Vaping Use: Never used  °Substance and Sexual Activity  °• Alcohol use: Yes  °  Comment: occasional  °• Drug use: No  °• Sexual activity: Yes  °  Birth control/protection: I.U.D.    °Other Topics Concern  °• Not on file  °Social History Narrative  ° Mother of twins   ° °Social Determinants of Health  ° °Financial Resource Strain:   °• Difficulty of Paying Living Expenses:   °Food Insecurity:   °• Worried About Running Out of Food in the Last Year:   °• Ran Out of Food in the Last Year:   °Transportation Needs:   °• Lack of Transportation (Medical):   °• Lack of Transportation (Non-Medical):   °Physical Activity:   °• Days of Exercise per Week:   °• Minutes of Exercise per Session:   °Stress:   °• Feeling of Stress :   °Social Connections:   °• Frequency of Communication with Friends and Family:   °• Frequency of Social Gatherings with Friends and Family:   °• Attends Religious Services:   °• Active Member of Clubs or Organizations:   °• Attends Club or Organization Meetings:   °• Marital Status:   °Intimate Partner Violence:   °• Fear of Current or Ex-Partner:   °• Emotionally Abused:   °• Physically Abused:   °• Sexually Abused:   ° ° °FAMILY HISTORY:  °Family History  °Problem Relation Age of Onset  °• GER disease Mother   °• Diabetes Mother   °• Hypertension Mother   °• Heart attack Father 48  °• Heart disease Father   °• Migraines Sister   °• Breast   cancer Maternal Grandmother   °• Stroke Maternal Grandmother   °• Breast cancer Cousin   °• Stroke Maternal Aunt   ° ° °CURRENT MEDICATIONS:  °Current Outpatient Medications  °Medication Sig Dispense Refill  °• allopurinol (ZYLOPRIM) 300 MG tablet Take 1 tablet (300 mg total) by mouth 2 (two) times daily. 60 tablet 2  °• dasatinib (SPRYCEL) 70 MG tablet Take 1 tablet (70 mg total) by mouth daily. 30 tablet 0  °• ergocalciferol (VITAMIN D2) 1.25 MG (50000 UT) capsule Take 1 capsule (50,000 Units total) by mouth once a week. One capsule once weekly 12 capsule 2  °• ferrous sulfate (FEROSUL) 325 (65 FE) MG tablet Take 1 tablet (325 mg total) by mouth 2 (two) times daily with a meal. 60 tablet 3  °• losartan (COZAAR) 50 MG tablet Take 1 tablet (50  mg total) by mouth daily. 90 tablet 3  °• Multiple Vitamin (MULTIVITAMIN WITH MINERALS) TABS tablet Take 1 tablet by mouth daily. 30 tablet 3  °• spironolactone (ALDACTONE) 25 MG tablet Take 0.5 tablets (12.5 mg total) by mouth daily. 45 tablet 3  ° °No current facility-administered medications for this visit.  ° ° °ALLERGIES:  °Allergies  °Allergen Reactions  °• Amlodipine   °  Headache   °• Tomato   °  Processed tomatoes in the can.   ° ° °PHYSICAL EXAM:  °Performance status (ECOG): 0 - Asymptomatic ° °Vitals:  ° 06/01/20 1035  °BP: 124/75  °Pulse: 63  °Resp: 18  °Temp: (!) 97 °F (36.1 °C)  °SpO2: 100%  ° °Wt Readings from Last 3 Encounters:  °06/01/20 206 lb 4.8 oz (93.6 kg)  °05/19/20 204 lb (92.5 kg)  °05/05/20 204 lb (92.5 kg)  ° °Physical Exam °Vitals reviewed.  °Constitutional:   °   Appearance: Normal appearance. She is obese.  °Cardiovascular:  °   Rate and Rhythm: Normal rate and regular rhythm.  °   Pulses: Normal pulses.  °   Heart sounds: Normal heart sounds.  °Pulmonary:  °   Effort: Pulmonary effort is normal.  °   Breath sounds: Normal breath sounds.  °Abdominal:  °   Palpations: Abdomen is soft. There is no hepatomegaly, splenomegaly or mass.  °   Tenderness: There is no abdominal tenderness.  °Musculoskeletal:  °   Right lower leg: No edema.  °   Left lower leg: No edema.  °Neurological:  °   General: No focal deficit present.  °   Mental Status: She is alert and oriented to person, place, and time.  °Psychiatric:     °   Mood and Affect: Mood normal.     °   Behavior: Behavior normal.  ° ° ° °LABORATORY DATA:  °I have reviewed the labs as listed.  °CBC Latest Ref Rng & Units 06/01/2020 04/28/2020 04/15/2020  °WBC 4.0 - 10.5 K/uL 4.9 5.4 38.2(H)  °Hemoglobin 12.0 - 15.0 g/dL 11.4(L) 9.2(L) 8.0(L)  °Hematocrit 36 - 46 % 35.8(L) 30.2(L) 26.1(L)  °Platelets 150 - 400 K/uL 236 239 146(L)  ° °CMP Latest Ref Rng & Units 06/01/2020 04/28/2020 04/15/2020  °Glucose 70 - 99 mg/dL 113(H) 101(H) 111(H)  °BUN 6 - 20  mg/dL 14 12 16  °Creatinine 0.44 - 1.00 mg/dL 0.97 0.93 0.90  °Sodium 135 - 145 mmol/L 138 138 138  °Potassium 3.5 - 5.1 mmol/L 3.9 3.6 3.6  °Chloride 98 - 111 mmol/L 106 104 107  °CO2 22 - 32 mmol/L 22 26 23  °Calcium 8.9 -   8.9 - 10.3 mg/dL 8.6(L) 8.3(L) 8.5(L)  Total Protein 6.5 - 8.1 g/dL 7.3 6.6 6.7  Total Bilirubin 0.3 - 1.2 mg/dL 0.6 0.4 0.3  Alkaline Phos 38 - 126 U/L 47 47 43  AST 15 - 41 U/L _0 ALT 0 - 44 U/L _1 Component Value Date/Time   RBC 3.79 (L) 06/01/2020 1032   MCV 94.5 06/01/2020 1032   MCH 30.1 06/01/2020 1032   MCHC 31.8 06/01/2020 1032   RDW 15.4 06/01/2020 1032   LYMPHSABS 1.5 06/01/2020 1032   MONOABS 0.2 06/01/2020 1032   EOSABS 0.1 06/01/2020 1032   BASOSABS 0.0 06/01/2020 1032   Lab Results  Component Value Date   LDH 147 06/01/2020   LDH 884 (H) 03/08/2020   LDH 711 (H) 02/15/2020   Lab Results  Component Value Date   VD25OH 32 10/07/2019   VD25OH 23 (L) 04/01/2019   VD25OH 15 (L) 09/16/2018      DIAGNOSTIC IMAGING:  I have independently reviewed the scans and discussed with the patient.   ASSESSMENT:  1. CML in chronic phase: -Presentation with hyperleukocytosis. BCR/ABL by FISH positive. -CT chest PE protocol showed splenomegaly measuring 14 cm. -BMBX on 02/23/2020 shows hypercellular marrow with myeloid hyperplasia consistent with CML. No increase in blasts. Flow cytometry less than 1% blasts. Chromosome analysis shows Philadelphia chromosome. -Dasatinib 70 mg daily started on 04/01/2020.   PLAN:  1. CML in chronic phase: -She is tolerating dasatinib 70 mg at bedtime very well.  No more nausea reported. -I reviewed her labs.  White count is normal.  Hemoglobin improved 11.4 and platelet count is normal. -She will continue dasatinib at the same dose.  I plan to repeat her labs including BCR/ABL by quantitative PCR in 4 weeks.  I will see her back in 5 weeks for follow-up.  2. Tumor lysis syndrome: -Uric acid is  normal today.  She will continue allopurinol once daily until she finishes her current bottle and will discontinue it.  3. Hypertension: -Continue losartan 50 mg daily.  Blood pressure today is normal.  4. Normocytic anemia: -She is taking iron tablet twice daily as her ferritin was low.  Hemoglobin improved 11.4.  She will continue iron tablet twice daily.  She was told to cut it back to once a day if she experiences constipation.  Orders placed this encounter:  No orders of the defined types were placed in this encounter.    Derek Jack, MD [place] [phone]   I, Milinda Antis, am acting as a scribe for Dr. Sanda Linger.  I, Derek Jack MD, have reviewed the above documentation for accuracy and completeness, and I agree with the above.

## 2020-06-09 ENCOUNTER — Other Ambulatory Visit: Payer: Self-pay | Admitting: Family Medicine

## 2020-06-27 ENCOUNTER — Other Ambulatory Visit: Payer: Self-pay

## 2020-06-27 DIAGNOSIS — E785 Hyperlipidemia, unspecified: Secondary | ICD-10-CM

## 2020-06-27 DIAGNOSIS — E8881 Metabolic syndrome: Secondary | ICD-10-CM

## 2020-06-27 DIAGNOSIS — I10 Essential (primary) hypertension: Secondary | ICD-10-CM

## 2020-06-27 DIAGNOSIS — E559 Vitamin D deficiency, unspecified: Secondary | ICD-10-CM

## 2020-06-30 ENCOUNTER — Other Ambulatory Visit (HOSPITAL_COMMUNITY): Payer: BC Managed Care – PPO

## 2020-06-30 ENCOUNTER — Other Ambulatory Visit: Payer: Self-pay

## 2020-06-30 ENCOUNTER — Inpatient Hospital Stay (HOSPITAL_COMMUNITY): Payer: BC Managed Care – PPO | Attending: Hematology

## 2020-06-30 ENCOUNTER — Other Ambulatory Visit (HOSPITAL_COMMUNITY)
Admission: RE | Admit: 2020-06-30 | Discharge: 2020-06-30 | Disposition: A | Discharge status: Home / Self Care | Payer: BC Managed Care – PPO | Source: Ambulatory Visit | Attending: Family Medicine | Admitting: Family Medicine

## 2020-06-30 DIAGNOSIS — E8881 Metabolic syndrome: Secondary | ICD-10-CM | POA: Diagnosis not present

## 2020-06-30 DIAGNOSIS — E559 Vitamin D deficiency, unspecified: Secondary | ICD-10-CM | POA: Diagnosis not present

## 2020-06-30 DIAGNOSIS — C9210 Chronic myeloid leukemia, BCR/ABL-positive, not having achieved remission: Secondary | ICD-10-CM

## 2020-06-30 DIAGNOSIS — E785 Hyperlipidemia, unspecified: Secondary | ICD-10-CM | POA: Diagnosis not present

## 2020-06-30 DIAGNOSIS — I10 Essential (primary) hypertension: Secondary | ICD-10-CM | POA: Diagnosis not present

## 2020-06-30 LAB — LIPID PANEL
Cholesterol: 147 mg/dL (ref 0–200)
HDL: 39 mg/dL — ABNORMAL LOW (ref >40)
LDL Cholesterol: 90 mg/dL (ref 0–99)
Total CHOL/HDL Ratio: 3.8 RATIO
Triglycerides: 90 mg/dL (ref <150)
VLDL: 18 mg/dL (ref 0–40)

## 2020-06-30 LAB — CBC WITH DIFFERENTIAL/PLATELET
Abs Immature Granulocytes: 0 10*3/uL (ref 0.00–0.07)
Basophils Absolute: 0 10*3/uL (ref 0.0–0.1)
Basophils Relative: 1 %
Eosinophils Absolute: 0.1 10*3/uL (ref 0.0–0.5)
Eosinophils Relative: 1 %
HCT: 37.5 % (ref 36.0–46.0)
Hemoglobin: 11.8 g/dL — ABNORMAL LOW (ref 12.0–15.0)
Immature Granulocytes: 0 %
Lymphocytes Relative: 37 %
Lymphs Abs: 1.7 10*3/uL (ref 0.7–4.0)
MCH: 29.3 pg (ref 26.0–34.0)
MCHC: 31.5 g/dL (ref 30.0–36.0)
MCV: 93.1 fL (ref 80.0–100.0)
Monocytes Absolute: 0.3 10*3/uL (ref 0.1–1.0)
Monocytes Relative: 6 %
Neutro Abs: 2.5 10*3/uL (ref 1.7–7.7)
Neutrophils Relative %: 55 %
Platelets: 235 10*3/uL (ref 150–400)
RBC: 4.03 MIL/uL (ref 3.87–5.11)
RDW: 13.7 % (ref 11.5–15.5)
WBC: 4.5 10*3/uL (ref 4.0–10.5)
nRBC: 0 % (ref 0.0–0.2)

## 2020-06-30 LAB — COMPREHENSIVE METABOLIC PANEL
ALT: 20 U/L (ref 0–44)
AST: 18 U/L (ref 15–41)
Albumin: 4 g/dL (ref 3.5–5.0)
Alkaline Phosphatase: 48 U/L (ref 38–126)
Anion gap: 11 (ref 5–15)
BUN: 16 mg/dL (ref 6–20)
CO2: 23 mmol/L (ref 22–32)
Calcium: 8.8 mg/dL — ABNORMAL LOW (ref 8.9–10.3)
Chloride: 105 mmol/L (ref 98–111)
Creatinine, Ser: 0.93 mg/dL (ref 0.44–1.00)
GFR calc Af Amer: >60 mL/min (ref >60)
GFR calc non Af Amer: >60 mL/min (ref >60)
Glucose, Bld: 93 mg/dL (ref 70–99)
Potassium: 3.7 mmol/L (ref 3.5–5.1)
Sodium: 139 mmol/L (ref 135–145)
Total Bilirubin: 0.4 mg/dL (ref 0.3–1.2)
Total Protein: 7.2 g/dL (ref 6.5–8.1)

## 2020-06-30 LAB — IRON AND TIBC
Iron: 57 ug/dL (ref 28–170)
Saturation Ratios: 13 % (ref 10.4–31.8)
TIBC: 427 ug/dL (ref 250–450)
UIBC: 370 ug/dL

## 2020-06-30 LAB — VITAMIN D 25 HYDROXY (VIT D DEFICIENCY, FRACTURES): Vit D, 25-Hydroxy: 33.63 ng/mL (ref 30–100)

## 2020-06-30 LAB — TSH: TSH: 1.523 u[IU]/mL (ref 0.350–4.500)

## 2020-06-30 LAB — FERRITIN: Ferritin: 20 ng/mL (ref 11–307)

## 2020-06-30 LAB — LACTATE DEHYDROGENASE: LDH: 145 U/L (ref 98–192)

## 2020-07-06 LAB — BCR-ABL1, CML/ALL, PCR, QUANT: b3a2 transcript: 2.1945 %

## 2020-07-07 ENCOUNTER — Other Ambulatory Visit: Payer: Self-pay

## 2020-07-07 ENCOUNTER — Inpatient Hospital Stay (HOSPITAL_COMMUNITY): Payer: BC Managed Care – PPO | Attending: Hematology | Admitting: Hematology

## 2020-07-07 DIAGNOSIS — I10 Essential (primary) hypertension: Secondary | ICD-10-CM | POA: Diagnosis not present

## 2020-07-07 DIAGNOSIS — E669 Obesity, unspecified: Secondary | ICD-10-CM | POA: Diagnosis not present

## 2020-07-07 DIAGNOSIS — Z87891 Personal history of nicotine dependence: Secondary | ICD-10-CM | POA: Diagnosis not present

## 2020-07-07 DIAGNOSIS — R161 Splenomegaly, not elsewhere classified: Secondary | ICD-10-CM | POA: Insufficient documentation

## 2020-07-07 DIAGNOSIS — Z803 Family history of malignant neoplasm of breast: Secondary | ICD-10-CM | POA: Insufficient documentation

## 2020-07-07 DIAGNOSIS — D649 Anemia, unspecified: Secondary | ICD-10-CM | POA: Diagnosis not present

## 2020-07-07 DIAGNOSIS — C9210 Chronic myeloid leukemia, BCR/ABL-positive, not having achieved remission: Secondary | ICD-10-CM | POA: Insufficient documentation

## 2020-07-07 DIAGNOSIS — L700 Acne vulgaris: Secondary | ICD-10-CM | POA: Insufficient documentation

## 2020-07-07 DIAGNOSIS — K5900 Constipation, unspecified: Secondary | ICD-10-CM | POA: Insufficient documentation

## 2020-07-07 NOTE — Patient Instructions (Signed)
[facility] at [facility] Discharge Instructions  You were seen today by Dr. Katragadda. He went over your recent results. Dr. Katragadda will see you back in 2 months for labs and follow up.   Thank you for choosing [facility] at [facility] to provide your oncology and hematology care.  To afford each patient quality time with our provider, please arrive at least 15 minutes before your scheduled appointment time.   If you have a lab appointment with the Cancer Center please come in thru the Main Entrance and check in at the main information desk  You need to re-schedule your appointment should you arrive 10 or more minutes late.  We strive to give you quality time with our providers, and arriving late affects you and other patients whose appointments are after yours.  Also, if you no show three or more times for appointments you may be dismissed from the clinic at the providers discretion.     Again, thank you for choosing [facility].  Our hope is that these requests will decrease the amount of time that you wait before being seen by our physicians.       _____________________________________________________________  Should you have questions after your visit to [facility], please contact our office at [phone] between the hours of 8:00 a.m. and 4:30 p.m.  Voicemails left after 4:00 p.m. will not be returned until the following business day.  For prescription refill requests, have your pharmacy contact our office and allow 72 hours.    Cancer Center Support Programs:   > Cancer Support Group  2nd Tuesday of the month 1pm-2pm, Journey Room   

## 2020-07-07 NOTE — Progress Notes (Signed)
[place], [place] [address]   CLINIC:  Medical Oncology/Hematology  PCP:  Fayrene Helper, MD [address], [place] / [place] [address]  [phone]  REASON FOR VISIT:  Follow-up for CML  PRIOR THERAPY: None  CURRENT THERAPY: Dasatinib 70 mg daily  INTERVAL HISTORY:  [name], a 44 y.o. female, returns for routine follow-up for her CML. Marcia Hahn was last seen on 06/01/2020.  Today she reports feeling well and tolerating the dasatinib well. She denies having any dyspnea with exertion or leg swelling. She is having some constipation from the iron supplements, but it is tolerable.   REVIEW OF SYSTEMS:  Review of Systems  Constitutional: Negative for appetite change and fatigue.  Respiratory: Negative for shortness of breath.   Cardiovascular: Negative for leg swelling.  Gastrointestinal: Positive for constipation and nausea.  All other systems reviewed and are negative.   PAST MEDICAL/SURGICAL HISTORY:  Past Medical History:  Diagnosis Date   Abnormal mammogram of right breast 09/03/2018   Acne vulgaris    Hypertension    Mild obesity    Past Surgical History:  Procedure Laterality Date   BREAST BIOPSY Left    CESAREAN SECTION      SOCIAL HISTORY:  Social History   Socioeconomic History   Marital status: Married    Spouse name: Not on file   Number of children: 2   Years of education: Not on file   Highest education level: Not on file  Occupational History   Occupation: Psychologist, educational   Tobacco Use   Smoking status: Former Smoker    Types: Cigarettes    Quit date: 12/04/2012    Years since quitting: 7.5   Smokeless tobacco: Never Used  Vaping Use   Vaping Use: Never used  Substance and Sexual Activity   Alcohol use: Yes    Comment: occasional   Drug use: No   Sexual activity: Yes    Birth control/protection: I.U.D.  Other Topics Concern   Not on file  Social History  Narrative   Mother of twins    Social Determinants of Health   Financial Resource Strain:    Difficulty of Paying Living Expenses: Not on file  Food Insecurity:    Worried About Charity fundraiser in the Last Year: Not on file   [employer] of Food in the Last Year: Not on file  Transportation Needs:    Lack of Transportation (Medical): Not on file   Lack of Transportation (Non-Medical): Not on file  Physical Activity:    Days of Exercise per Week: Not on file   Minutes of Exercise per Session: Not on file  Stress:    Feeling of Stress : Not on file  Social Connections:    Frequency of Communication with Friends and Family: Not on file   Frequency of Social Gatherings with Friends and Family: Not on file   Attends Religious Services: Not on file   Active Member of Clubs or Organizations: Not on file   Attends Archivist Meetings: Not on file   Marital Status: Not on file  Intimate Partner Violence:    Fear of Current or Ex-Partner: Not on file   Emotionally Abused: Not on file   Physically Abused: Not on file   Sexually Abused: Not on file    FAMILY HISTORY:  Family History  Problem Relation Age of Onset   GER disease Mother    Diabetes  Mother    Hypertension Mother    Heart attack Father 85   Heart disease Father    Migraines Sister    Breast cancer Maternal Grandmother    Stroke Maternal Grandmother    Breast cancer Cousin    Stroke Maternal Aunt     CURRENT MEDICATIONS:  Current Outpatient Medications  Medication Sig Dispense Refill   allopurinol (ZYLOPRIM) 300 MG tablet Take 1 tablet (300 mg total) by mouth 2 (two) times daily. 60 tablet 2   dasatinib (SPRYCEL) 70 MG tablet Take 1 tablet (70 mg total) by mouth daily. 30 tablet 0   ergocalciferol (VITAMIN D2) 1.25 MG (50000 UT) capsule Take 1 capsule (50,000 Units total) by mouth once a week. One capsule once weekly 12 capsule 2   FEROSUL 325 (65 Fe) MG tablet TAKE 1  TABLET(325 MG) BY MOUTH THREE TIMES DAILY 90 tablet 0   losartan (COZAAR) 50 MG tablet Take 1 tablet (50 mg total) by mouth daily. 90 tablet 3   Multiple Vitamin (MULTIVITAMIN WITH MINERALS) TABS tablet Take 1 tablet by mouth daily. 30 tablet 3   spironolactone (ALDACTONE) 25 MG tablet Take 0.5 tablets (12.5 mg total) by mouth daily. 45 tablet 3   No current facility-administered medications for this visit.    ALLERGIES:  Allergies  Allergen Reactions   Amlodipine     Headache    Tomato     Processed tomatoes in the can.     PHYSICAL EXAM:  Performance status (ECOG): 0 - Asymptomatic  There were no vitals filed for this visit. Wt Readings from Last 3 Encounters:  06/01/20 206 lb 4.8 oz (93.6 kg)  05/19/20 204 lb (92.5 kg)  05/05/20 204 lb (92.5 kg)   Physical Exam Constitutional:      Appearance: Normal appearance. She is obese.  Cardiovascular:     Rate and Rhythm: Normal rate and regular rhythm.     Pulses: Normal pulses.     Heart sounds: Normal heart sounds.  Pulmonary:     Effort: Pulmonary effort is normal.     Breath sounds: Normal breath sounds.  Abdominal:     Palpations: Abdomen is soft. There is no mass.     Tenderness: There is no abdominal tenderness.  Musculoskeletal:     Right lower leg: No edema.     Left lower leg: No edema.  Neurological:     General: No focal deficit present.     Mental Status: She is alert and oriented to person, place, and time.  Psychiatric:        Mood and Affect: Mood normal.        Behavior: Behavior normal.     LABORATORY DATA:  I have reviewed the labs as listed.  CBC Latest Ref Rng & Units 06/30/2020 06/01/2020 04/28/2020  WBC 4.0 - 10.5 K/uL 4.5 4.9 5.4  Hemoglobin 12.0 - 15.0 g/dL 11.8(L) 11.4(L) 9.2(L)  Hematocrit 36 - 46 % 37.5 35.8(L) 30.2(L)  Platelets 150 - 400 K/uL 235 236 239   CMP Latest Ref Rng & Units 06/30/2020 06/01/2020 04/28/2020  Glucose 70 - 99 mg/dL 93 113(H) 101(H)  BUN 6 - 20 mg/dL _0 Creatinine 0.44 - 1.00 mg/dL 0.93 0.97 0.93  Sodium 135 - 145 mmol/L 139 138 138  Potassium 3.5 - 5.1 mmol/L 3.7 3.9 3.6  Chloride 98 - 111 mmol/L 105 106 104  CO2 22 - 32 mmol/L _1 Calcium 8.9 - 10.3 mg/dL 8.8(L)  8.6(L) 8.3(L)  Total Protein 6.5 - 8.1 g/dL 7.2 7.3 6.6  Total Bilirubin 0.3 - 1.2 mg/dL 0.4 0.6 0.4  Alkaline Phos 38 - 126 U/L 48 47 47  AST 15 - 41 U/L _0 ALT 0 - 44 U/L _1 Component Value Date/Time   RBC 4.03 06/30/2020 0841   MCV 93.1 06/30/2020 0841   MCH 29.3 06/30/2020 0841   MCHC 31.5 06/30/2020 0841   RDW 13.7 06/30/2020 0841   LYMPHSABS 1.7 06/30/2020 0841   MONOABS 0.3 06/30/2020 0841   EOSABS 0.1 06/30/2020 0841   BASOSABS 0.0 06/30/2020 0841   Lab Results  Component Value Date   LDH 145 06/30/2020   LDH 147 06/01/2020   LDH 884 (H) 03/08/2020   Lab Results  Component Value Date   VD25OH 33.63 06/30/2020   VD25OH 32 10/07/2019   VD25OH 23 (L) 04/01/2019   Lab Results  Component Value Date   TIBC 427 06/30/2020   TIBC 365 04/28/2020   FERRITIN 20 06/30/2020   FERRITIN 20 04/28/2020   FERRITIN 13 (L) 04/01/2019   IRONPCTSAT 13 06/30/2020   IRONPCTSAT 13 04/28/2020    DIAGNOSTIC IMAGING:  I have independently reviewed the scans and discussed with the patient. No results found.   ASSESSMENT:  1. CML in chronic phase: -Presentation with hyperleukocytosis. BCR/ABL by FISH positive. -CT chest PE protocol showed splenomegaly measuring 14 cm. -BMBX on 02/23/2020 shows hypercellular marrow with myeloid hyperplasia consistent with CML. No increase in blasts. Flow cytometry less than 1% blasts. Chromosome analysis shows Philadelphia chromosome. -Dasatinib 70 mg daily started on 04/01/2020. -BCR/ABL by quantitative PCR on 06/30/2020 shows B3 A2 transcript improved to 2.19 from 128 previously.  B2 A2 and E1 A2 transcripts are undetectable.   PLAN:  1. CML in chronic phase: -She is continuing to tolerate dasatinib 70  mg at bedtime very well. -We reviewed results of BCR/ABL by quantitative PCR which showed improvement in B3 A2 transcript. -I plan to continue dasatinib at the same dose because of better tolerance.  I warned her about possible pleural effusions. -She will come back in 2 months for follow-up with repeat labs.  2. Tumor lysis syndrome: -Uric acid is normal.  Allopurinol was discontinued.  3. Hypertension: -Continue losartan 50 mg daily.  4. Normocytic anemia: -Continue iron tablet twice daily.  Ferritin today is 20 and hemoglobin 11.8.  Orders placed this encounter:  No orders of the defined types were placed in this encounter.    Derek Jack, MD [place] [phone]   I, Milinda Antis, am acting as a scribe for Dr. Sanda Linger.  I, Derek Jack MD, have reviewed the above documentation for accuracy and completeness, and I agree with the above.

## 2020-07-09 ENCOUNTER — Other Ambulatory Visit (HOSPITAL_COMMUNITY): Payer: Self-pay | Admitting: Hematology

## 2020-07-09 ENCOUNTER — Other Ambulatory Visit: Payer: Self-pay | Admitting: Family Medicine

## 2020-07-26 ENCOUNTER — Ambulatory Visit: Payer: BC Managed Care – PPO | Admitting: Family Medicine

## 2020-07-26 ENCOUNTER — Telehealth: Payer: Self-pay | Admitting: Family Medicine

## 2020-07-26 NOTE — Telephone Encounter (Signed)
Pt is requesting a prescription Diflucan.  [id number] - [place], [place] Phone:  [phone]  Fax:  [phone]

## 2020-07-27 NOTE — Telephone Encounter (Signed)
Patient called in wanting a prescription for Diflucan. C/o of irritation and itching. X 3 days.

## 2020-07-28 ENCOUNTER — Other Ambulatory Visit: Payer: Self-pay | Admitting: Family Medicine

## 2020-07-28 MED ORDER — FLUCONAZOLE 150 MG PO TABS: 150.0000 mg | ORAL_TABLET | Freq: Once | ORAL | 0 refills | Status: AC

## 2020-07-28 NOTE — Telephone Encounter (Signed)
Prescribed, remind her of upcoming appt also pls

## 2020-07-28 NOTE — Telephone Encounter (Signed)
Left message letting patient prescription has been called in and reminded her of upcoming appt

## 2020-08-05 ENCOUNTER — Other Ambulatory Visit (HOSPITAL_COMMUNITY): Payer: Self-pay | Admitting: Hematology

## 2020-08-05 DIAGNOSIS — C9210 Chronic myeloid leukemia, BCR/ABL-positive, not having achieved remission: Secondary | ICD-10-CM

## 2020-08-08 ENCOUNTER — Other Ambulatory Visit: Payer: Self-pay

## 2020-08-08 ENCOUNTER — Ambulatory Visit (INDEPENDENT_AMBULATORY_CARE_PROVIDER_SITE_OTHER): Payer: BC Managed Care – PPO | Admitting: Family Medicine

## 2020-08-08 ENCOUNTER — Other Ambulatory Visit (HOSPITAL_COMMUNITY): Payer: Self-pay

## 2020-08-08 ENCOUNTER — Encounter: Payer: Self-pay | Admitting: Family Medicine

## 2020-08-08 ENCOUNTER — Other Ambulatory Visit (HOSPITAL_COMMUNITY): Payer: Self-pay | Admitting: Family Medicine

## 2020-08-08 VITALS — BP 118/78 | HR 53 | Resp 16 | Ht 66.0 in | Wt 210.0 lb

## 2020-08-08 DIAGNOSIS — J3089 Other allergic rhinitis: Secondary | ICD-10-CM

## 2020-08-08 DIAGNOSIS — C9210 Chronic myeloid leukemia, BCR/ABL-positive, not having achieved remission: Secondary | ICD-10-CM

## 2020-08-08 DIAGNOSIS — I10 Essential (primary) hypertension: Secondary | ICD-10-CM | POA: Diagnosis not present

## 2020-08-08 DIAGNOSIS — E669 Obesity, unspecified: Secondary | ICD-10-CM

## 2020-08-08 DIAGNOSIS — N920 Excessive and frequent menstruation with regular cycle: Secondary | ICD-10-CM

## 2020-08-08 DIAGNOSIS — Z1231 Encounter for screening mammogram for malignant neoplasm of breast: Secondary | ICD-10-CM

## 2020-08-08 DIAGNOSIS — Z23 Encounter for immunization: Secondary | ICD-10-CM

## 2020-08-08 DIAGNOSIS — Z01818 Encounter for other preprocedural examination: Secondary | ICD-10-CM

## 2020-08-08 MED ORDER — DASATINIB 70 MG PO TABS: 70.0000 mg | ORAL_TABLET | Freq: Every day | ORAL | 3 refills | Status: DC

## 2020-08-08 MED ORDER — LORATADINE 10 MG PO TABS: 10.0000 mg | ORAL_TABLET | Freq: Every day | ORAL | 1 refills | Status: DC

## 2020-08-08 MED ORDER — CHLORPHENIRAMINE MALEATE 4 MG PO TABS: ORAL_TABLET | ORAL | 0 refills | Status: DC

## 2020-08-08 NOTE — Patient Instructions (Addendum)
Annual exam in office with MD in early January, call if you need me before  Please schedule December mammogram at checkout Flu vaccine today  BP is 144/90 when I check please check at home and weekly, if at this level need to come back in sooner   PLEASE get covid vaccines in the next 1 to 5 weeks  You are referred urgently to [facility] re sterilization and heavy bleeding, BTL and ablation   Thankful you are on the road to cure.  Medications , loratidine and chlorpheniramine are both prescribed for allergies,  Both are over the counter, and generic  It is important that you exercise regularly at least 30 minutes 5 times a week. If you develop chest pain, have severe difficulty breathing, or feel very tired, stop exercising immediately and seek medical attention   Think about what you will eat, plan ahead. Choose " clean, green, fresh or frozen" over canned, processed or packaged foods which are more sugary, salty and fatty. 70 to 75% of food eaten should be vegetables and fruit. Three meals at set times with snacks allowed between meals, but they must be fruit or vegetables. Aim to eat over a 12 hour period , example 7 am to 7 pm, and STOP after  your last meal of the day. Drink water,generally about 64 ounces per day, no other drink is as healthy. Fruit juice is best enjoyed in a healthy way, by EATING the fruit.  Thanks for choosing [facility], we consider it a privelige to serve you.

## 2020-08-14 ENCOUNTER — Encounter: Payer: Self-pay | Admitting: Family Medicine

## 2020-08-14 DIAGNOSIS — J3089 Other allergic rhinitis: Secondary | ICD-10-CM | POA: Insufficient documentation

## 2020-08-14 NOTE — Assessment & Plan Note (Signed)
Managed by oncology with excellent response to treatment

## 2020-08-14 NOTE — Assessment & Plan Note (Signed)
Loratidine daily and chlorpheniramine , as needed, are recommended

## 2020-08-14 NOTE — Assessment & Plan Note (Signed)
  Patient re-educated about  the importance of commitment to a  minimum of 150 minutes of exercise per week as able.  The importance of healthy food choices with portion control discussed, as well as eating regularly and within a 12 hour window most days. The need to choose "clean , green" food 50 to 75% of the time is discussed, as well as to make water the primary drink and set a goal of 64 ounces water daily.    Weight /BMI 08/08/2020 06/01/2020 05/19/2020  WEIGHT 210 lb 0.3 oz 206 lb 4.8 oz 204 lb  HEIGHT 5\' 6"  - 5\' 5"   BMI 33.9 kg/m2 34.33 kg/m2 33.95 kg/m2

## 2020-08-14 NOTE — Assessment & Plan Note (Signed)
Uncontrolled on my check though noted as controlled by patient at home as well as different Providers. Homemonitor and call if elevated in next 2 to 6 weeks. DASH diet and commitment to daily physical activity for a minimum of 30 minutes discussed and encouraged, as a part of hypertension management. The importance of attaining a healthy weight is also discussed.  BP/Weight 08/08/2020 06/01/2020 05/19/2020 05/05/2020 05/02/2020 7/51/0258 03/13/7781  Systolic BP 423 536 144 315 400 867 619  Diastolic BP 78 75 68 75 76 71 57  Wt. (Lbs) 210.02 206.3 204 204 204.4 202.1 203.8  BMI 33.9 34.33 33.95 32.93 32.99 31.65 31.92

## 2020-08-14 NOTE — Assessment & Plan Note (Signed)
C/o very heavy bleeding with clots, desires ablation and sterilization refer Gyne

## 2020-08-14 NOTE — Progress Notes (Signed)
Marcia Hahn     MRN: [id number]      DOB: October 15, 1976   HPI Marcia Hahn is here for follow up and re-evaluation of chronic medical conditions, medication management and review of any available recent lab and radiology data.  Preventive health is updated, specifically  Cancer screening and Immunization.   Questions or concerns regarding consultations or procedures which the PT has had in the interim are  addressed. The PT denies any adverse reactions to current medications since the last visit.  C/o increased and uncontrolled allergies C/o heavy cycles and wants ablation and sterilization  ROS Denies recent fever or chills. Denies sinus pressure, , ear pain or sore throat.c/o excess nasal congestion with clear drainage x 3 weeks Denies chest congestion, productive cough or wheezing. Denies chest pains, palpitations and leg swelling Denies abdominal pain, nausea, vomiting,diarrhea or constipation.   Denies dysuria, frequency, hesitancy or incontinence. Denies joint pain, swelling and limitation in mobility. Denies headaches, seizures, numbness, or tingling. Denies depression, anxiety or insomnia. Denies skin break down or rash.   PE  BP 118/78   Pulse (!) 53   Resp 16   Ht 5\' 6"  (1.676 m)   Wt 210 lb 0.3 oz (95.3 kg)   BMI 33.90 kg/m   Patient alert and oriented and in no cardiopulmonary distress.  HEENT: No facial asymmetry, EOMI,     Neck supple . No sinus tenderness, TM clear, positive nasal congestion Chest: Clear to auscultation bilaterally.  CVS: S1, S2 no murmurs, no S3.Regular rate.  ABD: Soft non tender.   Ext: No edema  MS: Adequate ROM spine, shoulders, hips and knees.  Skin: Intact, no ulcerations or rash noted.  Psych: Good eye contact, normal affect. Memory intact not anxious or depressed appearing.  CNS: CN 2-12 intact, power,  normal throughout.no focal deficits noted.   Assessment & Plan  Benign essential HTN Uncontrolled on my check though  noted as controlled by patient at home as well as different Providers. Homemonitor and call if elevated in next 2 to 6 weeks. DASH diet and commitment to daily physical activity for a minimum of 30 minutes discussed and encouraged, as a part of hypertension management. The importance of attaining a healthy weight is also discussed.  BP/Weight 08/08/2020 06/01/2020 05/19/2020 05/05/2020 05/02/2020 01/05/8249 0/01/7047  Systolic BP 889 169 450 388 828 003 491  Diastolic BP 78 75 68 75 76 71 57  Wt. (Lbs) 210.02 206.3 204 204 204.4 202.1 203.8  BMI 33.9 34.33 33.95 32.93 32.99 31.65 31.92       Obesity (BMI 30.0-34.9)  Patient re-educated about  the importance of commitment to a  minimum of 150 minutes of exercise per week as able.  The importance of healthy food choices with portion control discussed, as well as eating regularly and within a 12 hour window most days. The need to choose "clean , green" food 50 to 75% of the time is discussed, as well as to make water the primary drink and set a goal of 64 ounces water daily.    Weight /BMI 08/08/2020 06/01/2020 05/19/2020  WEIGHT 210 lb 0.3 oz 206 lb 4.8 oz 204 lb  HEIGHT 5\' 6"  - 5\' 5"   BMI 33.9 kg/m2 34.33 kg/m2 33.95 kg/m2      Menorrhagia with regular cycle C/o very heavy bleeding with clots, desires ablation and sterilization refer Gyne  CML (chronic myelocytic leukemia) ([place]) Managed by oncology with excellent response to treatment  Environmental and seasonal allergies  Loratidine daily and chlorpheniramine , as needed, are recommended

## 2020-08-16 ENCOUNTER — Ambulatory Visit: Payer: BC Managed Care – PPO | Admitting: Family Medicine

## 2020-08-25 ENCOUNTER — Ambulatory Visit (INDEPENDENT_AMBULATORY_CARE_PROVIDER_SITE_OTHER): Payer: BC Managed Care – PPO | Admitting: Obstetrics & Gynecology

## 2020-08-25 ENCOUNTER — Encounter: Payer: Self-pay | Admitting: Obstetrics & Gynecology

## 2020-08-25 VITALS — BP 140/84 | HR 78 | Wt 214.0 lb

## 2020-08-25 DIAGNOSIS — Z30431 Encounter for routine checking of intrauterine contraceptive device: Secondary | ICD-10-CM

## 2020-08-25 DIAGNOSIS — N946 Dysmenorrhea, unspecified: Secondary | ICD-10-CM | POA: Diagnosis not present

## 2020-08-25 DIAGNOSIS — N920 Excessive and frequent menstruation with regular cycle: Secondary | ICD-10-CM | POA: Diagnosis not present

## 2020-08-25 MED ORDER — LEVONORGESTREL 20 MCG/24HR IU IUD
INTRAUTERINE_SYSTEM | Freq: Once | INTRAUTERINE | Status: AC
  Administered 2020-08-25: 17:00:00 1 via INTRAUTERINE

## 2020-08-25 MED ORDER — NAPROXEN SODIUM 550 MG PO TABS: 550.0000 mg | ORAL_TABLET | Freq: Two times a day (BID) | ORAL | 1 refills | Status: DC

## 2020-08-25 NOTE — Progress Notes (Signed)
Chief Complaint  Patient presents with  . Menorrhagia      44 y.o. [id number] Patient's last menstrual period was 08/11/2020. The current method of family planning is IUD.  Outpatient Encounter Medications as of 08/25/2020  Medication Sig Note  . dasatinib (SPRYCEL) 70 MG tablet Take 1 tablet (70 mg total) by mouth daily.   Marland Kitchen losartan (COZAAR) 50 MG tablet Take 1 tablet (50 mg total) by mouth daily.   Marland Kitchen spironolactone (ALDACTONE) 25 MG tablet Take 0.5 tablets (12.5 mg total) by mouth daily.   . [DISCONTINUED] chlorpheniramine (CHLOR-TRIMETON) 4 MG tablet Take one tablet by mouth once daily as needed for uncontrolled allergies.   . [DISCONTINUED] ergocalciferol (VITAMIN D2) 1.25 MG (50000 UT) capsule Take 1 capsule (50,000 Units total) by mouth once a week. One capsule once weekly 02/08/2020: Patient takes on Sundays  . [DISCONTINUED] FEROSUL 325 (65 Fe) MG tablet TAKE 1 TABLET(325 MG) BY MOUTH THREE TIMES DAILY   . [DISCONTINUED] loratadine (CLARITIN) 10 MG tablet Take 1 tablet (10 mg total) by mouth daily.   . [DISCONTINUED] Multiple Vitamin (MULTIVITAMIN WITH MINERALS) TABS tablet Take 1 tablet by mouth daily.   . [DISCONTINUED] naproxen sodium (ANAPROX DS) 550 MG tablet Take 1 tablet (550 mg total) by mouth 2 (two) times daily with a meal.   . [EXPIRED] levonorgestrel (MIRENA) 20 MCG/24HR IUD     No facility-administered encounter medications on file as of 08/25/2020.    Subjective Patient comes in with complaint of heavy vaginal bleeding which she has had essentially for years  She has an IUD in place She desires permanent sterilization Past Medical History:  Diagnosis Date  . Abnormal mammogram of right breast 09/03/2018  . Acne vulgaris   . Hypertension   . Mild obesity     Past Surgical History:  Procedure Laterality Date  . BREAST BIOPSY Left   . CESAREAN SECTION    . DILATATION AND CURETTAGE/HYSTEROSCOPY WITH MINERVA N/A 10/26/2020   Procedure: DILATATION AND  CURETTAGE/HYSTEROSCOPY WITH MINERVA ENDOMETRIAL ABLATION;  Surgeon: Florian Buff, MD;  Location: [facility];  Service: Gynecology;  Laterality: N/A;  . LAPAROSCOPIC BILATERAL SALPINGECTOMY Bilateral 10/26/2020   Procedure: LAPAROSCOPIC BILATERAL SALPINGECTOMY;  Surgeon: Florian Buff, MD;  Location: [facility];  Service: Gynecology;  Laterality: Bilateral;    OB History    Gravida  1   Para  1   Term      Preterm  1   AB      Living  2     SAB      IAB      Ectopic      Multiple  1   Live Births  2           Allergies  Allergen Reactions  . Amlodipine     Headache   . Tomato     Processed tomatoes in the can.     Social History   Socioeconomic History  . Marital status: Married    Spouse name: Not on file  . Number of children: 2  . Years of education: Not on file  . Highest education level: Not on file  Occupational History  . Occupation: Psychologist, educational   Tobacco Use  . Smoking status: Former Smoker    Types: Cigarettes    Quit date: 12/04/2012    Years since quitting: 7.9  . Smokeless tobacco: Never Used  Vaping Use  . Vaping Use: Never used  Substance and Sexual Activity  .  Alcohol use: Yes    Comment: occasional  . Drug use: No  . Sexual activity: Not Currently    Birth control/protection: Surgical    Comment: tubal and ablation  Other Topics Concern  . Not on file  Social History Narrative   Mother of twins    Social Determinants of Health   Financial Resource Strain: Low Risk   . Difficulty of Paying Living Expenses: Not hard at all  Food Insecurity: No Food Insecurity  . Worried About Charity fundraiser in the Last Year: Never true  . Ran Out of Food in the Last Year: Never true  Transportation Needs: No Transportation Needs  . Lack of Transportation (Medical): No  . Lack of Transportation (Non-Medical): No  Physical Activity: Inactive  . Days of Exercise per Week: 0 days  . Minutes of Exercise per Session: 0 min  Stress: No Stress  Concern Present  . Feeling of Stress : Not at all  Social Connections: Moderately Integrated  . Frequency of Communication with Friends and Family: More than three times a week  . Frequency of Social Gatherings with Friends and Family: Once a week  . Attends Religious Services: 1 to 4 times per year  . Active Member of Clubs or Organizations: No  . Attends Archivist Meetings: Never  . Marital Status: Married    Family History  Problem Relation Age of Onset  . GER disease Mother   . Diabetes Mother   . Hypertension Mother   . Heart attack Father 58  . Heart disease Father   . Migraines Sister   . Breast cancer Maternal Grandmother   . Stroke Maternal Grandmother   . Breast cancer Cousin   . Stroke Maternal Aunt     Medications:       Current Outpatient Medications:  .  dasatinib (SPRYCEL) 70 MG tablet, Take 1 tablet (70 mg total) by mouth daily., Disp: 30 tablet, Rfl: 3 .  losartan (COZAAR) 50 MG tablet, Take 1 tablet (50 mg total) by mouth daily., Disp: 90 tablet, Rfl: 3 .  spironolactone (ALDACTONE) 25 MG tablet, Take 0.5 tablets (12.5 mg total) by mouth daily., Disp: 45 tablet, Rfl: 3 .  Multiple Vitamins-Minerals (MULTIVITAMIN WITH MINERALS) tablet, Take 1 tablet by mouth daily., Disp: , Rfl:   Objective Blood pressure 140/84, pulse 78, weight 214 lb (97.1 kg), last menstrual period 08/11/2020.  General WDWN female NAD Vulva:  normal appearing vulva with no masses, tenderness or lesions Vagina:  normal mucosa, no discharge Cervix:  Normal no lesions Uterus:  normal size, contour, position, consistency, mobility, non-tender Adnexa: ovaries:present,  normal adnexa in size, nontender and no masses    Pertinent ROS No burning with urination, frequency or urgency No nausea, vomiting or diarrhea Nor fever chills or other constitutional symptoms    Labs or studies     Impression Diagnoses this Encounter::   ICD-10-CM   1. Menorrhagia with regular  cycle, with Paragard in place  N92.0   2. Dysmenorrhea, with Paragard in place  N94.6     Established relevant diagnosis(es):   Plan/Recommendations: Meds ordered this encounter  Medications  . levonorgestrel (MIRENA) 20 MCG/24HR IUD  . DISCONTD: naproxen sodium (ANAPROX DS) 550 MG tablet    Sig: Take 1 tablet (550 mg total) by mouth 2 (two) times daily with a meal.    Dispense:  40 tablet    Refill:  1    Labs or Scans Ordered: No orders of the  defined types were placed in this encounter.   Management:: Sonogram to evaluate pathology of endometrium and uterus Plan to proceed with hysteroscopy D&C Dmitry ablation bilateral salpingectomy  Follow up Return if symptoms worsen or fail to improve.         All questions were answered.    Florian Buff, MD 11/13/2020 8:19 PM

## 2020-09-15 ENCOUNTER — Other Ambulatory Visit: Payer: Self-pay

## 2020-09-15 ENCOUNTER — Inpatient Hospital Stay (HOSPITAL_COMMUNITY): Payer: BC Managed Care – PPO | Attending: Hematology

## 2020-09-15 DIAGNOSIS — D649 Anemia, unspecified: Secondary | ICD-10-CM | POA: Diagnosis not present

## 2020-09-15 DIAGNOSIS — E883 Tumor lysis syndrome: Secondary | ICD-10-CM | POA: Insufficient documentation

## 2020-09-15 DIAGNOSIS — C9210 Chronic myeloid leukemia, BCR/ABL-positive, not having achieved remission: Secondary | ICD-10-CM

## 2020-09-15 DIAGNOSIS — I10 Essential (primary) hypertension: Secondary | ICD-10-CM | POA: Diagnosis not present

## 2020-09-15 DIAGNOSIS — Z79899 Other long term (current) drug therapy: Secondary | ICD-10-CM | POA: Diagnosis not present

## 2020-09-15 DIAGNOSIS — K5900 Constipation, unspecified: Secondary | ICD-10-CM | POA: Insufficient documentation

## 2020-09-15 DIAGNOSIS — Z803 Family history of malignant neoplasm of breast: Secondary | ICD-10-CM | POA: Insufficient documentation

## 2020-09-15 DIAGNOSIS — E669 Obesity, unspecified: Secondary | ICD-10-CM | POA: Diagnosis not present

## 2020-09-15 DIAGNOSIS — Z87891 Personal history of nicotine dependence: Secondary | ICD-10-CM | POA: Diagnosis not present

## 2020-09-15 DIAGNOSIS — C9110 Chronic lymphocytic leukemia of B-cell type not having achieved remission: Secondary | ICD-10-CM | POA: Diagnosis not present

## 2020-09-15 DIAGNOSIS — R110 Nausea: Secondary | ICD-10-CM | POA: Insufficient documentation

## 2020-09-15 DIAGNOSIS — R161 Splenomegaly, not elsewhere classified: Secondary | ICD-10-CM | POA: Diagnosis not present

## 2020-09-15 LAB — CBC WITH DIFFERENTIAL/PLATELET
Abs Immature Granulocytes: 0.01 10*3/uL (ref 0.00–0.07)
Basophils Absolute: 0 10*3/uL (ref 0.0–0.1)
Basophils Relative: 0 %
Eosinophils Absolute: 0.6 10*3/uL — ABNORMAL HIGH (ref 0.0–0.5)
Eosinophils Relative: 9 %
HCT: 34.7 % — ABNORMAL LOW (ref 36.0–46.0)
Hemoglobin: 11.1 g/dL — ABNORMAL LOW (ref 12.0–15.0)
Immature Granulocytes: 0 %
Lymphocytes Relative: 38 %
Lymphs Abs: 2.6 10*3/uL (ref 0.7–4.0)
MCH: 30.2 pg (ref 26.0–34.0)
MCHC: 32 g/dL (ref 30.0–36.0)
MCV: 94.6 fL (ref 80.0–100.0)
Monocytes Absolute: 0.4 10*3/uL (ref 0.1–1.0)
Monocytes Relative: 6 %
Neutro Abs: 3.3 10*3/uL (ref 1.7–7.7)
Neutrophils Relative %: 47 %
Platelets: 270 10*3/uL (ref 150–400)
RBC: 3.67 MIL/uL — ABNORMAL LOW (ref 3.87–5.11)
RDW: 14.8 % (ref 11.5–15.5)
WBC: 6.8 10*3/uL (ref 4.0–10.5)
nRBC: 0 % (ref 0.0–0.2)

## 2020-09-15 LAB — COMPREHENSIVE METABOLIC PANEL
ALT: 17 U/L (ref 0–44)
AST: 18 U/L (ref 15–41)
Albumin: 3.9 g/dL (ref 3.5–5.0)
Alkaline Phosphatase: 41 U/L (ref 38–126)
Anion gap: 7 (ref 5–15)
BUN: 16 mg/dL (ref 6–20)
CO2: 25 mmol/L (ref 22–32)
Calcium: 8.5 mg/dL — ABNORMAL LOW (ref 8.9–10.3)
Chloride: 105 mmol/L (ref 98–111)
Creatinine, Ser: 0.91 mg/dL (ref 0.44–1.00)
GFR, Estimated: >60 mL/min (ref >60)
Glucose, Bld: 90 mg/dL (ref 70–99)
Potassium: 3.7 mmol/L (ref 3.5–5.1)
Sodium: 137 mmol/L (ref 135–145)
Total Bilirubin: 0.5 mg/dL (ref 0.3–1.2)
Total Protein: 6.8 g/dL (ref 6.5–8.1)

## 2020-09-21 ENCOUNTER — Other Ambulatory Visit: Payer: Self-pay | Admitting: Family Medicine

## 2020-09-21 ENCOUNTER — Other Ambulatory Visit: Payer: Self-pay

## 2020-09-21 ENCOUNTER — Inpatient Hospital Stay (HOSPITAL_BASED_OUTPATIENT_CLINIC_OR_DEPARTMENT_OTHER): Payer: BC Managed Care – PPO | Admitting: Hematology

## 2020-09-21 VITALS — BP 130/79 | HR 78 | Temp 97.6°F | Resp 18 | Wt 214.0 lb

## 2020-09-21 DIAGNOSIS — Z87891 Personal history of nicotine dependence: Secondary | ICD-10-CM | POA: Diagnosis not present

## 2020-09-21 DIAGNOSIS — D649 Anemia, unspecified: Secondary | ICD-10-CM | POA: Diagnosis not present

## 2020-09-21 DIAGNOSIS — K5900 Constipation, unspecified: Secondary | ICD-10-CM | POA: Diagnosis not present

## 2020-09-21 DIAGNOSIS — R161 Splenomegaly, not elsewhere classified: Secondary | ICD-10-CM | POA: Diagnosis not present

## 2020-09-21 DIAGNOSIS — C9210 Chronic myeloid leukemia, BCR/ABL-positive, not having achieved remission: Secondary | ICD-10-CM

## 2020-09-21 DIAGNOSIS — Z79899 Other long term (current) drug therapy: Secondary | ICD-10-CM | POA: Diagnosis not present

## 2020-09-21 DIAGNOSIS — E669 Obesity, unspecified: Secondary | ICD-10-CM | POA: Diagnosis not present

## 2020-09-21 DIAGNOSIS — Z803 Family history of malignant neoplasm of breast: Secondary | ICD-10-CM | POA: Diagnosis not present

## 2020-09-21 DIAGNOSIS — I10 Essential (primary) hypertension: Secondary | ICD-10-CM | POA: Diagnosis not present

## 2020-09-21 DIAGNOSIS — E883 Tumor lysis syndrome: Secondary | ICD-10-CM | POA: Diagnosis not present

## 2020-09-21 DIAGNOSIS — R110 Nausea: Secondary | ICD-10-CM | POA: Diagnosis not present

## 2020-09-21 DIAGNOSIS — C9110 Chronic lymphocytic leukemia of B-cell type not having achieved remission: Secondary | ICD-10-CM | POA: Diagnosis not present

## 2020-09-21 NOTE — Patient Instructions (Signed)
[place] at [facility] Discharge Instructions  You were seen today by Dr. Delton Coombes. He went over your recent results. Apply a moisturizing lotion on your dry skin as needed. Dr. Delton Coombes will see you back in 2 months for labs and follow up.   Thank you for choosing [place] at [facility] to provide your oncology and hematology care.  To afford each patient quality time with our provider, please arrive at least 15 minutes before your scheduled appointment time.   If you have a lab appointment with the [facility] please come in thru the Main Entrance and check in at the main information desk  You need to re-schedule your appointment should you arrive 10 or more minutes late.  We strive to give you quality time with our providers, and arriving late affects you and other patients whose appointments are after yours.  Also, if you no show three or more times for appointments you may be dismissed from the clinic at the providers discretion.     Again, thank you for choosing [facility]  Our hope is that these requests will decrease the amount of time that you wait before being seen by our physicians.       _____________________________________________________________  Should you have questions after your visit to [facility], please contact our office at [phone] between the hours of 8:00 a.m. and 4:30 p.m.  Voicemails left after 4:00 p.m. will not be returned until the following business day.  For prescription refill requests, have your pharmacy contact our office and allow 72 hours.    Cancer Center Support Programs:   > Cancer Support Group  2nd Tuesday of the month 1pm-2pm, Journey Room

## 2020-09-21 NOTE — Progress Notes (Signed)
[place], [place] [id number]   CLINIC:  Medical Oncology/Hematology  PCP:  Fayrene Helper, MD [address] [place] / [place] [address]  [phone]  REASON FOR VISIT:  Follow-up for CML  PRIOR THERAPY: None  CURRENT THERAPY: Dasatinib 70 mg daily  INTERVAL HISTORY:  [name], a 44 y.o. female, returns for routine follow-up for her CML. Dariann was last seen on 07/07/2020.  Today she reports feeling well. She is tolerating dasatinib well and she reports occasional nausea and constipation, but she is eating more fiber to help her BM's be more regular. She takes an iron tablet daily as well. She denies SOB, palpitations, itching or rashes.   REVIEW OF SYSTEMS:  Review of Systems  Constitutional: Negative for appetite change and fatigue.  Respiratory: Positive for cough. Negative for shortness of breath.   Cardiovascular: Negative for palpitations.  Skin: Negative for itching and rash.  All other systems reviewed and are negative.   PAST MEDICAL/SURGICAL HISTORY:  Past Medical History:  Diagnosis Date  . Abnormal mammogram of right breast 09/03/2018  . Acne vulgaris   . Hypertension   . Mild obesity    Past Surgical History:  Procedure Laterality Date  . BREAST BIOPSY Left   . CESAREAN SECTION      SOCIAL HISTORY:  Social History   Socioeconomic History  . Marital status: Married    Spouse name: Not on file  . Number of children: 2  . Years of education: Not on file  . Highest education level: Not on file  Occupational History  . Occupation: Psychologist, educational   Tobacco Use  . Smoking status: Former Smoker    Types: Cigarettes    Quit date: 12/04/2012    Years since quitting: 7.8  . Smokeless tobacco: Never Used  Vaping Use  . Vaping Use: Never used  Substance and Sexual Activity  . Alcohol use: Yes    Comment: occasional  . Drug use: No  . Sexual activity: Yes    Birth control/protection:  I.U.D.  Other Topics Concern  . Not on file  Social History Narrative   Mother of twins    Social Determinants of Health   Financial Resource Strain: Low Risk   . Difficulty of Paying Living Expenses: Not hard at all  Food Insecurity: No Food Insecurity  . Worried About Charity fundraiser in the Last Year: Never true  . Ran Out of Food in the Last Year: Never true  Transportation Needs: No Transportation Needs  . Lack of Transportation (Medical): No  . Lack of Transportation (Non-Medical): No  Physical Activity: Inactive  . Days of Exercise per Week: 0 days  . Minutes of Exercise per Session: 0 min  Stress: No Stress Concern Present  . Feeling of Stress : Not at all  Social Connections: Moderately Integrated  . Frequency of Communication with Friends and Family: More than three times a week  . Frequency of Social Gatherings with Friends and Family: Once a week  . Attends Religious Services: 1 to 4 times per year  . Active Member of Clubs or Organizations: No  . Attends Archivist Meetings: Never  . Marital Status: Married  Human resources officer Violence: Not At Risk  . Fear of Current or Ex-Partner: No  . Emotionally Abused: No  . Physically Abused: No  . Sexually Abused: No    FAMILY HISTORY:  Family History  Problem Relation Age  of Onset  . GER disease Mother   . Diabetes Mother   . Hypertension Mother   . Heart attack Father 51  . Heart disease Father   . Migraines Sister   . Breast cancer Maternal Grandmother   . Stroke Maternal Grandmother   . Breast cancer Cousin   . Stroke Maternal Aunt     CURRENT MEDICATIONS:  Current Outpatient Medications  Medication Sig Dispense Refill  . chlorpheniramine (CHLOR-TRIMETON) 4 MG tablet Take one tablet by mouth once daily as needed for uncontrolled allergies. 30 tablet 0  . dasatinib (SPRYCEL) 70 MG tablet Take 1 tablet (70 mg total) by mouth daily. 30 tablet 3  . ergocalciferol (VITAMIN D2) 1.25 MG (50000 UT)  capsule Take 1 capsule (50,000 Units total) by mouth once a week. One capsule once weekly 12 capsule 2  . FEROSUL 325 (65 Fe) MG tablet TAKE 1 TABLET(325 MG) BY MOUTH THREE TIMES DAILY 90 tablet 0  . loratadine (CLARITIN) 10 MG tablet Take 1 tablet (10 mg total) by mouth daily. 90 tablet 1  . losartan (COZAAR) 50 MG tablet Take 1 tablet (50 mg total) by mouth daily. 90 tablet 3  . Multiple Vitamin (MULTIVITAMIN WITH MINERALS) TABS tablet Take 1 tablet by mouth daily. 30 tablet 3  . naproxen sodium (ANAPROX DS) 550 MG tablet Take 1 tablet (550 mg total) by mouth 2 (two) times daily with a meal. 40 tablet 1  . spironolactone (ALDACTONE) 25 MG tablet Take 0.5 tablets (12.5 mg total) by mouth daily. 45 tablet 3   No current facility-administered medications for this visit.    ALLERGIES:  Allergies  Allergen Reactions  . Amlodipine     Headache   . Tomato     Processed tomatoes in the can.     PHYSICAL EXAM:  Performance status (ECOG): 0 - Asymptomatic  Vitals:   09/21/20 1427  BP: 130/79  Pulse: 78  Resp: 18  Temp: 97.6 F (36.4 C)  SpO2: 99%   Wt Readings from Last 3 Encounters:  09/21/20 214 lb (97.1 kg)  08/25/20 214 lb (97.1 kg)  08/08/20 210 lb 0.3 oz (95.3 kg)   Physical Exam Vitals reviewed.  Constitutional:      Appearance: Normal appearance. She is obese.  Cardiovascular:     Rate and Rhythm: Normal rate and regular rhythm.     Pulses: Normal pulses.     Heart sounds: Normal heart sounds.  Pulmonary:     Effort: Pulmonary effort is normal.     Breath sounds: Normal breath sounds.  Neurological:     General: No focal deficit present.     Mental Status: She is alert and oriented to person, place, and time.  Psychiatric:        Mood and Affect: Mood normal.        Behavior: Behavior normal.     LABORATORY DATA:  I have reviewed the labs as listed.  CBC Latest Ref Rng & Units 09/15/2020 06/30/2020 06/01/2020  WBC 4.0 - 10.5 K/uL 6.8 4.5 4.9  Hemoglobin 12.0  - 15.0 g/dL 11.1(L) 11.8(L) 11.4(L)  Hematocrit 36 - 46 % 34.7(L) 37.5 35.8(L)  Platelets 150 - 400 K/uL 270 235 236   CMP Latest Ref Rng & Units 09/15/2020 06/30/2020 06/01/2020  Glucose 70 - 99 mg/dL 90 93 113(H)  BUN 6 - 20 mg/dL _0 Creatinine 0.44 - 1.00 mg/dL 0.91 0.93 0.97  Sodium 135 - 145 mmol/L 137 139 138  Potassium  3.5 - 5.1 mmol/L 3.7 3.7 3.9  Chloride 98 - 111 mmol/L 105 105 106  CO2 22 - 32 mmol/L _0 Calcium 8.9 - 10.3 mg/dL 8.5(L) 8.8(L) 8.6(L)  Total Protein 6.5 - 8.1 g/dL 6.8 7.2 7.3  Total Bilirubin 0.3 - 1.2 mg/dL 0.5 0.4 0.6  Alkaline Phos 38 - 126 U/L 41 48 47  AST 15 - 41 U/L _1 ALT 0 - 44 U/L _2 Component Value Date/Time   RBC 3.67 (L) 09/15/2020 1107   MCV 94.6 09/15/2020 1107   MCH 30.2 09/15/2020 1107   MCHC 32.0 09/15/2020 1107   RDW 14.8 09/15/2020 1107   LYMPHSABS 2.6 09/15/2020 1107   MONOABS 0.4 09/15/2020 1107   EOSABS 0.6 (H) 09/15/2020 1107   BASOSABS 0.0 09/15/2020 1107    DIAGNOSTIC IMAGING:  I have independently reviewed the scans and discussed with the patient. No results found.   ASSESSMENT:  1. CML in chronic phase: -Presentation with hyperleukocytosis. BCR/ABL by FISH positive. -CT chest PE protocol showed splenomegaly measuring 14 cm. -BMBX on 02/23/2020 shows hypercellular marrow with myeloid hyperplasia consistent with CML. No increase in blasts. Flow cytometry less than 1% blasts. Chromosome analysis shows Philadelphia chromosome. -Dasatinib 70 mg daily started on 04/01/2020. -BCR/ABL by quantitative PCR on 06/30/2020 shows B3 A2 transcript improved to 2.19 from 128 previously.  B2 A2 and E1 A2 transcripts are undetectable.   PLAN:  1. CML in chronic phase: -She is tolerating dasatinib 70 mg daily very well.  No GI toxicities noted. -Reviewed labs from 09/15/2020 which showed normal white count 6.8 with normal differential.  Platelets are normal.  Hemoglobin 11.1.  LFTs are normal. -Physical  exam did not reveal any splenomegaly.  No evidence of fluid retention.  Plan to continue dasatinib at same dose. -Plan to repeat BCR/ABL by quantitative PCR in 2 months.  2. Tumor lysis syndrome: -Allopurinol was discontinued as uric acid normalized.  3. Hypertension: -Continue losartan 50 mg daily.  4. Normocytic anemia: -Continue iron tablet twice daily. -Hemoglobin is remaining stable between 11 and 12.  Orders placed this encounter:  Orders Placed This Encounter  Procedures  . CBC with Differential/Platelet  . Comprehensive metabolic panel  . BCR-ABL1, CML/ALL, PCR, QUANT  . Lactate dehydrogenase     Derek Jack, MD [place] [phone]   I, Milinda Antis, am acting as a scribe for Dr. Sanda Linger.  I, Derek Jack MD, have reviewed the above documentation for accuracy and completeness, and I agree with the above.

## 2020-09-26 ENCOUNTER — Encounter (HOSPITAL_COMMUNITY): Payer: Self-pay | Admitting: *Deleted

## 2020-09-26 ENCOUNTER — Encounter: Payer: Self-pay | Admitting: Family Medicine

## 2020-09-26 ENCOUNTER — Ambulatory Visit (INDEPENDENT_AMBULATORY_CARE_PROVIDER_SITE_OTHER): Payer: BC Managed Care – PPO | Admitting: Family Medicine

## 2020-09-26 ENCOUNTER — Other Ambulatory Visit: Payer: Self-pay

## 2020-09-26 VITALS — BP 119/80 | HR 76 | Resp 16 | Ht 64.0 in | Wt 214.0 lb

## 2020-09-26 DIAGNOSIS — Z1211 Encounter for screening for malignant neoplasm of colon: Secondary | ICD-10-CM

## 2020-09-26 DIAGNOSIS — M25572 Pain in left ankle and joints of left foot: Secondary | ICD-10-CM | POA: Diagnosis not present

## 2020-09-26 DIAGNOSIS — M25561 Pain in right knee: Secondary | ICD-10-CM

## 2020-09-26 DIAGNOSIS — Z0000 Encounter for general adult medical examination without abnormal findings: Secondary | ICD-10-CM | POA: Diagnosis not present

## 2020-09-26 DIAGNOSIS — G8929 Other chronic pain: Secondary | ICD-10-CM

## 2020-09-26 NOTE — Assessment & Plan Note (Signed)

## 2020-09-26 NOTE — Progress Notes (Signed)
Forms for [facility] cancer policy faxed to [employer] per patient's request.  Copy of paperwork also emailed to patient's personal email per her request.

## 2020-09-26 NOTE — Patient Instructions (Addendum)
Follow-up in office in 3 months call if you need me sooner.  Please get x-ray of your left ankle and right knee today.  Please work on weight loss by change in food choices and portion control as well as starting regular exercise.  Weight loss goal of 5 pounds per month.  You are referred for your colonoscopy which is due in April 2022.  Thankful that you are doing very well overall keep up your commitment to good health and good health habits. Joint Pain  Joint pain can be caused by many things. It is likely to go away if you follow instructions from your doctor for taking care of yourself at home. Sometimes, you may need more treatment. Follow these instructions at home: Managing pain, stiffness, and swelling   If told, put ice on the painful area. ? Put ice in a plastic bag. ? Place a towel between your skin and the bag. ? Leave the ice on for 20 minutes, 2-3 times a day.  If told, put heat on the painful area. Do this as often as told by your doctor. Use the heat source that your doctor recommends, such as a moist heat pack or a heating pad. ? Place a towel between your skin and the heat source. ? Leave the heat on for 20-30 minutes. ? Take off the heat if your skin gets bright red. This is especially important if you are unable to feel pain, heat, or cold. You may have a greater risk of getting burned.  Move your fingers or toes below the painful joint often. This helps with stiffness and swelling.  If possible, raise (elevate) the painful joint above the level of your heart while you are sitting or lying down. To do this, try putting a few pillows under the painful joint. Activity  Rest the painful joint for as long as told. Do not do things that cause pain or make your pain worse.  Begin exercising or stretching the affected area, as told by your doctor. Ask your doctor what types of exercise are safe for you. If you have an elastic bandage, sling, or splint:  Wear the  device as told by your doctor. Take it off only as told by your doctor.  Loosen the device if your fingers or toes below the joint: ? Tingle. ? Lose feeling (get numb). ? Get cold and blue.  Keep the device clean.  Ask your doctor if you should take off the device before bathing. You may need to cover it with a watertight covering when you take a bath or a shower. General instructions  Take over-the-counter and prescription medicines only as told by your doctor.  Do not use any products that contain nicotine or tobacco. These include cigarettes and e-cigarettes. If you need help quitting, ask your doctor.  Keep all follow-up visits as told by your doctor. This is important. Contact a doctor if:  You have pain that gets worse and does not get better with medicine.  Your joint pain does not get better in 3 days.  You have more bruising or swelling.  You have a fever.  You lose 10 lb (4.5 kg) or more without trying. Get help right away if:  You cannot move the joint.  Your fingers or toes tingle, lose feeling, or get cold and blue.  You have a fever along with a joint that is red, warm, and swollen. Summary  Joint pain can be caused by many things. It often  goes away if you follow instructions from your doctor for taking care of yourself at home.  Rest the painful joint for as long as told. Do not do things that cause pain or make your pain worse.  Take over-the-counter and prescription medicines only as told by your doctor. This information is not intended to replace advice given to you by your health care provider. Make sure you discuss any questions you have with your health care provider. Document Revised: 10/11/2017 Document Reviewed: 08/14/2017 Elsevier Patient Education  [place].  Preventing Osteoarthritis, Adult Osteoarthritis is a type of arthritis that affects tissue that covers the ends of bones in joints (cartilage). Cartilage acts as a cushion  between the bones and helps them move smoothly. Osteoarthritis results when cartilage in the joints gets worn down. This causes the joint to break down over time. Osteoarthritis cannot always be prevented, but you can take steps to lower your risk of developing this condition. Once you have osteoarthritis, it does not go away, so lowering your risk is important. How can this condition affect me? Osteoarthritis can affect any joint and can make movement painful. It usually affects the joints of the hands, spine, hips, knees, or toes. The knee joints are most commonly affected. The condition can cause symptoms such as:  Pain, tenderness, and stiffness in a joint, especially first thing in the morning or after a period of resting.  Trouble moving the affected joint.  A grating or scraping feeling inside the joint when using it. This condition can make it harder to do things that you need or want to do each day. Osteoarthritis in a major joint, such as your knee or hip, can make it painful to walk or exercise. If you develop osteoarthritis in your hands, you might not be able to grip items, twist your hand, or control small movements of your hands and fingers. Osteoarthritis is a degenerative disease. This means that it may get worse over time. What can increase my risk? Some factors may make you more likely to develop osteoarthritis, such as:  Being 48 years of age or older.  Being overweight.  Doing activities that put extra stress on your joints.  Injuring or overusing a joint.  Having previous surgery on a joint.  Having a family history of osteoarthritis. Some risk factors, such as your age and family history, cannot be changed. But you can take steps to control other risk factors and lower your chances of developing osteoarthritis. What actions can I take to prevent this condition? Activity      Stay active. Doing moderate exercise for 30 minutes, 5 times a week will help you  maintain a healthy weight and strengthen the muscles that support your joints. Good exercises for preventing osteoarthritis and protecting your joints include low-impact activities, such as walking, swimming, hiking, yoga, and biking.  Avoid doing too many high-impact exercises and activities that put extra stress on joints. These include activities that involve heavy lifting, gripping, bending, kneeling, and squatting. Osteoarthritis is much more common in people with jobs that require these activities. If your job involves this type of activity, learn ways to reduce the stress on your joints, such as: ? Using proper lifting techniques. ? Taking breaks as needed.  Consider working with a Music therapist when starting a new physical activity or sport. A coach or trainer can help you: ? Learn proper techniques for the activity. ? Come up with a training plan that is safe and  effective for you.  Get treatment for any joint injury. After an injury, do not return to full activity until you have been cleared by your health care provider. An injured joint that does not heal properly is much more likely to develop osteoarthritis.  Maintain good posture while standing and sitting. This reduces stress on the joints of your back, neck, and hips. ? When standing, keep your upper back and neck straight, with your shoulders pulled back. Avoid slouching. ? When sitting, keep your back straight and relax your shoulders. Do not round your shoulders or pull them backward. Lifestyle  Lose weight if you are overweight. More weight puts more stress on your joints.  Control your blood sugar. Diabetes may increase your risk of osteoarthritis. Have your blood sugar checked to make sure you are not at risk of developing diabetes. If you already have diabetes, work closely with your health care provider to keep your blood sugar at a healthy level.  Do not wear high heels. These put stress on your toes, knees, and  hips.  Do not use any products that contain nicotine or tobacco, such as cigarettes, e-cigarettes, and chewing tobacco. If you need help quitting, ask your health care provider. Nutrition   Eat a healthy diet that includes foods that are high in vitamin C, vitamin D, and omega-3 fatty acids. ? Get vitamin C from fruits and vegetables. ? Get vitamin D from fish, eggs, and dairy products. Some foods have vitamin D added to them (are fortified). Look for fortified foods like milk, orange juice, and cereals. Spending some time in the sun also increases vitamin D. ? Get omega-3 from cold-water fish, such as cod, mackerel, and sardines. Other sources include:  Fortified eggs.  Soybeans.  Nuts, such as walnuts.  Flax, chia, and hemp seeds.  Check with your health care provider before taking an over-the-counter supplement for bone or joint health, especially if you are taking any medicines. Supplements may interact with some medicines. Also, evidence that taking a supplement will reduce your risk of osteoarthritis is not strong. Where to find more information  Arthritis Foundation: www.arthritis.[place] of Rheumatology: www.rheumatology.[facility] of Arthritis and Musculoskeletal and Skin Diseases: www.niams.[employer] Contact a health care provider if:  You have pain or swelling in a joint.  You have joint stiffness or you lose full motion at the joint.  Your joint becomes large and knobby.  You have cracking or grinding in a joint.  You have a joint injury. Summary  Osteoarthritis involves the loss of tissue that covers the ends of bones in joints (cartilage). It can affect any joint and can make movement painful.  Osteoarthritis is most common in middle and older age.  Osteoarthritis cannot always be prevented, but you can take steps to lower your risk of developing this condition.  Eating a healthy diet, losing weight, and staying active may lower  your risk of osteoarthritis.  Let your health care provider know if you have a joint injury or joint pain. This information is not intended to replace advice given to you by your health care provider. Make sure you discuss any questions you have with your health care provider. Document Revised: 05/11/2019 Document Reviewed: 02/03/2019 Elsevier Patient Education  [place].

## 2020-09-27 ENCOUNTER — Encounter (INDEPENDENT_AMBULATORY_CARE_PROVIDER_SITE_OTHER): Payer: Self-pay | Admitting: *Deleted

## 2020-09-27 ENCOUNTER — Ambulatory Visit (INDEPENDENT_AMBULATORY_CARE_PROVIDER_SITE_OTHER): Payer: BC Managed Care – PPO | Admitting: Obstetrics & Gynecology

## 2020-09-27 ENCOUNTER — Encounter: Payer: Self-pay | Admitting: Obstetrics & Gynecology

## 2020-09-27 VITALS — BP 143/89 | HR 94 | Ht 65.0 in

## 2020-09-27 DIAGNOSIS — N920 Excessive and frequent menstruation with regular cycle: Secondary | ICD-10-CM

## 2020-09-27 DIAGNOSIS — N946 Dysmenorrhea, unspecified: Secondary | ICD-10-CM

## 2020-09-27 DIAGNOSIS — Z302 Encounter for sterilization: Secondary | ICD-10-CM

## 2020-09-27 NOTE — Progress Notes (Signed)
IUD Insertion Procedure Note  Pre-operative Diagnosis: Mirena IUD in place and patient desires removal  Post-operative Diagnosis: same  Indications: pt desires removal  Procedure Details  Urine pregnancy test was not done.  The risks (including infection, bleeding, pain, and uterine perforation) and benefits of the procedure were explained to the patient and Written informed consent was obtained.    Mirena IUD removed without difficulty    Condition: Stable  Complications: None  Plan:  The patient was advised to call for any fever or for prolonged or severe pain or bleeding. She was advised to use OTC analgesics as needed for mild to moderate pain.   Attending Physician Documentation: I was present for or performed the following: Removal of Mirena IUD

## 2020-09-29 ENCOUNTER — Encounter: Payer: Self-pay | Admitting: Family Medicine

## 2020-09-29 NOTE — Progress Notes (Signed)
    Marcia Hahn     MRN: [id number]      DOB: 1976/04/15  HPI: Patient is in for annual physical exam. C/o left ankle and right knee pain and stiffnes Recent labs,  are reviewed. Immunization is reviewed , and  updated if needed.   PE: BP 119/80   Pulse 76   Resp 16   Ht 5\' 4"  (1.626 m)   Wt 214 lb 0.6 oz (97.1 kg)   SpO2 96%   BMI 36.74 kg/m  Pleasant  female, alert and oriented x 3, in no cardio-pulmonary distress. Afebrile. HEENT No facial trauma or asymetry. Sinuses non tender.  Extra occullar muscles intact.. External ears normal, . Neck: supple, no adenopathy,JVD or thyromegaly.No bruits.  Chest: Clear to ascultation bilaterally.No crackles or wheezes. Non tender to palpation  Breast: Asymptomatic.Not examined Normal mammogram in 2020 and has upcoming appointment  Cardiovascular system; Heart sounds normal,  S1 and  S2 ,no S3.  No murmur, or thrill. Apical beat not displaced Peripheral pulses normal.  Abdomen: Soft, non tender, no organomegaly or masses. No bruits. Bowel sounds normal. No guarding, tenderness or rebound.   GU: Asymptomatic, not examined  Musculoskeletal exam:  and ankle. No deformity ,swelling or crepitus noted. No muscle wasting or atrophy.   Neurologic: Cranial nerves 2 to 12 intact. Power, tone ,sensation and reflexes normal throughout. No disturbance in gait. No tremor.  Skin: Intact, no ulceration, erythema , scaling or rash noted. Pigmentation normal throughout  Psych; Normal mood and affect. Judgement and concentration normal   Assessment & Plan:  Annual physical exam Annual exam as documented. Counseling done  re healthy lifestyle involving commitment to 150 minutes exercise per week, heart healthy diet, and attaining healthy weight.The importance of adequate sleep also discussed. Regular seat belt use and home safety, is also discussed. Changes in health habits are decided on by the patient with goals and time  frames  set for achieving them. Immunization and cancer screening needs are specifically addressed at this visit.

## 2020-10-12 DIAGNOSIS — Z029 Encounter for administrative examinations, unspecified: Secondary | ICD-10-CM

## 2020-10-19 ENCOUNTER — Encounter: Payer: Self-pay | Admitting: *Deleted

## 2020-10-21 NOTE — Patient Instructions (Addendum)
Marcia Hahn  10/21/2020     @PREFPERIOPPHARMACY @   Your procedure is scheduled on  10/26/2020.  Report to [facility] at  [email or web address].  Call this number if you have problems the morning of surgery:  [phone]   Remember:  You may have clear liquids until you drink your carb drink at 0645. Then nothing else to eat or drink.                         Take these medicines the morning of surgery with A SIP OF WATER  sprycel.    Do not wear jewelry, make-up or nail polish.  Do not wear lotions, powders, or perfumes. Please wear deodorant and brush your teeth.  Do not shave 48 hours prior to surgery.  Men may shave face and neck.  Do not bring valuables to the hospital.  [facility] is not responsible for any belongings or valuables.  Contacts, dentures or bridgework may not be worn into surgery.  Leave your suitcase in the car.  After surgery it may be brought to your room.  For patients admitted to the hospital, discharge time will be determined by your treatment team.  Patients discharged the day of surgery will not be allowed to drive home.   Name and phone number of your driver:   family Special instructions:  DO NOT smoke the morning of your procedure.  Please read over the following fact sheets that you were given. Anesthesia Post-op Instructions and Care and Recovery After Surgery       Salpingectomy, Care After This sheet gives you information about how to care for yourself after your procedure. Your health care provider may also give you more specific instructions. If you have problems or questions, contact your health care provider. What can I expect after the procedure? After the procedure, it is common to have:  Pain in your abdomen.  Some light vaginal bleeding (spotting) for a few days.  Tiredness. Your recovery time will vary depending on which method your surgeon used for your surgery. Follow these instructions at home: Incision  care   Follow instructions from your health care provider about how to take care of your incisions. Make sure you: ? Wash your hands with soap and water before and after you change your bandage (dressing). If soap and water are not available, use hand sanitizer. ? Change or remove your dressing as told by your health care provider. ? Leave any stitches (sutures), skin glue, or adhesive strips in place. These skin closures may need to stay in place for 2 weeks or longer. If adhesive strip edges start to loosen and curl up, you may trim the loose edges. Do not remove adhesive strips completely unless your health care provider tells you to do that.  Keep your dressing clean and dry.  Check your incision area every day for signs of infection. Check for: ? Redness, swelling, or pain that gets worse. ? Fluid or blood. ? Warmth. ? Pus or a bad smell. Activity  Rest as told by your health care provider.  Avoid sitting for a long time without moving. Get up to take short walks every 1-2 hours. This is important to improve blood flow and breathing. Ask for help if you feel weak or unsteady.  Return to your normal activities as told by your health care provider. Ask your health care provider  what activities are safe for you.  Do not drive until your health care provider says that it is safe.  Do not lift anything that is heavier than 10 lb (4.5 kg), or the limit that you are told, until your health care provider says that it is safe. This may be 2-6 weeks depending on your surgery.  Until your health care provider approves: ? Do not douche. ? Do not use tampons. ? Do not have sex. Medicines  Take over-the-counter and prescription medicines only as told by your health care provider.  Ask your health care provider if the medicine prescribed to you: ? Requires you to avoid driving or using heavy machinery. ? Can cause constipation. You may need to take actions to prevent or treat constipation,  such as:  Drink enough fluid to keep your urine pale yellow.  Take over-the-counter or prescription medicines.  Eat foods that are high in fiber, such as beans, whole grains, and fresh fruits and vegetables.  Limit foods that are high in fat and processed sugars, such as fried or sweet foods. General instructions  Wear compression stockings as told by your health care provider. These stockings help to prevent blood clots and reduce swelling in your legs.  Do not use any products that contain nicotine or tobacco, such as cigarettes, e-cigarettes, and chewing tobacco. If you need help quitting, ask your health care provider.  Do not take baths, swim, or use a hot tub until your health care provider approves. You may take showers.  Keep all follow-up visits as told by your health care provider. This is important. Contact a health care provider if you have:  Pain when you urinate.  Redness, swelling, or pain around an incision.  Fluid or blood coming from an incision.  Pus or a bad smell coming from an incision.  An incision that feels warm to the touch.  A fever.  Abdominal pain that gets worse or does not get better with medicine.  An incision that starts to break open.  A rash.  Light-headedness.  Nausea and vomiting. Get help right away if you:  Have pain in your chest or leg.  Develop shortness of breath.  Faint.  Have increased or heavy vaginal bleeding, such as soaking a pad in an hour. Summary  After the procedure, it is common to feel tired, have some pain in your abdomen, and have some light vaginal bleeding for a few days.  Follow instructions from your health care provider about how to take care of your incisions.  Return to your normal activities as told by your health care provider. Ask your health care provider what activities are safe for you.  Do not douche, use tampons, or have sex until your health care provider approves.  Keep all follow-up  visits as told by your health care provider. This information is not intended to replace advice given to you by your health care provider. Make sure you discuss any questions you have with your health care provider. Document Revised: 10/20/2018 Document Reviewed: 10/20/2018 Elsevier Patient Education  [place].  Dilation and Curettage or Vacuum Curettage, Care After These instructions give you information about caring for yourself after your procedure. Your doctor may also give you more specific instructions. Call your doctor if you have any problems or questions after your procedure. Follow these instructions at home: Activity  Do not drive or use heavy machinery while taking prescription pain medicine.  For 24 hours after your procedure, avoid  driving.  Take short walks often, followed by rest periods. Ask your doctor what activities are safe for you. After one or two days, you may be able to return to your normal activities.  Do not lift anything that is heavier than 10 lb (4.5 kg) until your doctor approves.  For at least 2 weeks, or as long as told by your doctor: ? Do not douche. ? Do not use tampons. ? Do not have sex. General instructions   Take over-the-counter and prescription medicines only as told by your doctor. This is very important if you take blood thinning medicine.  Do not take baths, swim, or use a hot tub until your doctor approves. Take showers instead of baths.  Wear compression stockings as told by your doctor.  It is up to you to get the results of your procedure. Ask your doctor when your results will be ready.  Keep all follow-up visits as told by your doctor. This is important. Contact a doctor if:  You have very bad cramps that get worse or do not get better with medicine.  You have very bad pain in your belly (abdomen).  You cannot drink fluids without throwing up (vomiting).  You get pain in a different part of the area between your  belly and thighs (pelvis).  You have bad-smelling discharge from your vagina.  You have a rash. Get help right away if:  You are bleeding a lot from your vagina. A lot of bleeding means soaking more than one sanitary pad in an hour, for 2 hours in a row.  You have clumps of blood (blood clots) coming from your vagina.  You have a fever or chills.  Your belly feels very tender or hard.  You have chest pain.  You have trouble breathing.  You cough up blood.  You feel dizzy.  You feel light-headed.  You pass out (faint).  You have pain in your neck or shoulder area. Summary  Take short walks often, followed by rest periods. Ask your doctor what activities are safe for you. After one or two days, you may be able to return to your normal activities.  Do not lift anything that is heavier than 10 lb (4.5 kg) until your doctor approves.  Do not take baths, swim, or use a hot tub until your doctor approves. Take showers instead of baths.  Contact your doctor if you have any symptoms of infection, like bad-smelling discharge from your vagina. This information is not intended to replace advice given to you by your health care provider. Make sure you discuss any questions you have with your health care provider. Document Revised: 10/11/2017 Document Reviewed: 07/16/2016 Elsevier Patient Education  2020 [employer] Anesthesia, Adult, Care After This sheet gives you information about how to care for yourself after your procedure. Your health care provider may also give you more specific instructions. If you have problems or questions, contact your health care provider. What can I expect after the procedure? After the procedure, the following side effects are common:  Pain or discomfort at the IV site.  Nausea.  Vomiting.  Sore throat.  Trouble concentrating.  Feeling cold or chills.  Weak or tired.  Sleepiness and fatigue.  Soreness and body aches. These  side effects can affect parts of the body that were not involved in surgery. Follow these instructions at home:  For at least 24 hours after the procedure:  Have a responsible adult stay with you. It  is important to have someone help care for you until you are awake and alert.  Rest as needed.  Do not: ? Participate in activities in which you could fall or become injured. ? Drive. ? Use heavy machinery. ? Drink alcohol. ? Take sleeping pills or medicines that cause drowsiness. ? Make important decisions or sign legal documents. ? Take care of children on your own. Eating and drinking  Follow any instructions from your health care provider about eating or drinking restrictions.  When you feel hungry, start by eating small amounts of foods that are soft and easy to digest (bland), such as toast. Gradually return to your regular diet.  Drink enough fluid to keep your urine pale yellow.  If you vomit, rehydrate by drinking water, juice, or clear broth. General instructions  If you have sleep apnea, surgery and certain medicines can increase your risk for breathing problems. Follow instructions from your health care provider about wearing your sleep device: ? Anytime you are sleeping, including during daytime naps. ? While taking prescription pain medicines, sleeping medicines, or medicines that make you drowsy.  Return to your normal activities as told by your health care provider. Ask your health care provider what activities are safe for you.  Take over-the-counter and prescription medicines only as told by your health care provider.  If you smoke, do not smoke without supervision.  Keep all follow-up visits as told by your health care provider. This is important. Contact a health care provider if:  You have nausea or vomiting that does not get better with medicine.  You cannot eat or drink without vomiting.  You have pain that does not get better with medicine.  You are  unable to pass urine.  You develop a skin rash.  You have a fever.  You have redness around your IV site that gets worse. Get help right away if:  You have difficulty breathing.  You have chest pain.  You have blood in your urine or stool, or you vomit blood. Summary  After the procedure, it is common to have a sore throat or nausea. It is also common to feel tired.  Have a responsible adult stay with you for the first 24 hours after general anesthesia. It is important to have someone help care for you until you are awake and alert.  When you feel hungry, start by eating small amounts of foods that are soft and easy to digest (bland), such as toast. Gradually return to your regular diet.  Drink enough fluid to keep your urine pale yellow.  Return to your normal activities as told by your health care provider. Ask your health care provider what activities are safe for you. This information is not intended to replace advice given to you by your health care provider. Make sure you discuss any questions you have with your health care provider. Document Revised: 11/01/2017 Document Reviewed: 06/14/2017 Elsevier Patient Education  [place]. How to Use Chlorhexidine for Bathing Chlorhexidine gluconate (CHG) is a germ-killing (antiseptic) solution that is used to clean the skin. It can get rid of the bacteria that normally live on the skin and can keep them away for about 24 hours. To clean your skin with CHG, you may be given:  A CHG solution to use in the shower or as part of a sponge bath.  A prepackaged cloth that contains CHG. Cleaning your skin with CHG may help lower the risk for infection:  While you are staying  in the intensive care unit of the hospital.  If you have a vascular access, such as a central line, to provide short-term or long-term access to your veins.  If you have a catheter to drain urine from your bladder.  If you are on a ventilator. A ventilator  is a machine that helps you breathe by moving air in and out of your lungs.  After surgery. What are the risks? Risks of using CHG include:  A skin reaction.  Hearing loss, if CHG gets in your ears.  Eye injury, if CHG gets in your eyes and is not rinsed out.  The CHG product catching fire. Make sure that you avoid smoking and flames after applying CHG to your skin. Do not use CHG:  If you have a chlorhexidine allergy or have previously reacted to chlorhexidine.  On babies younger than 71 months of age. How to use CHG solution  Use CHG only as told by your health care provider, and follow the instructions on the label.  Use the full amount of CHG as directed. Usually, this is one bottle. During a shower Follow these steps when using CHG solution during a shower (unless your health care provider gives you different instructions): 1. Start the shower. 2. Use your normal soap and shampoo to wash your face and hair. 3. Turn off the shower or move out of the shower stream. 4. Pour the CHG onto a clean washcloth. Do not use any type of brush or rough-edged sponge. 5. Starting at your neck, lather your body down to your toes. Make sure you follow these instructions: ? If you will be having surgery, pay special attention to the part of your body where you will be having surgery. Scrub this area for at least 1 minute. ? Do not use CHG on your head or face. If the solution gets into your ears or eyes, rinse them well with water. ? Avoid your genital area. ? Avoid any areas of skin that have broken skin, cuts, or scrapes. ? Scrub your back and under your arms. Make sure to wash skin folds. 6. Let the lather sit on your skin for 1-2 minutes or as long as told by your health care provider. 7. Thoroughly rinse your entire body in the shower. Make sure that all body creases and crevices are rinsed well. 8. Dry off with a clean towel. Do not put any substances on your body afterward--such as  powder, lotion, or perfume--unless you are told to do so by your health care provider. Only use lotions that are recommended by the manufacturer. 9. Put on clean clothes or pajamas. 10. If it is the night before your surgery, sleep in clean sheets.  During a sponge bath Follow these steps when using CHG solution during a sponge bath (unless your health care provider gives you different instructions): 1. Use your normal soap and shampoo to wash your face and hair. 2. Pour the CHG onto a clean washcloth. 3. Starting at your neck, lather your body down to your toes. Make sure you follow these instructions: ? If you will be having surgery, pay special attention to the part of your body where you will be having surgery. Scrub this area for at least 1 minute. ? Do not use CHG on your head or face. If the solution gets into your ears or eyes, rinse them well with water. ? Avoid your genital area. ? Avoid any areas of skin that have broken skin, cuts, or  scrapes. ? Scrub your back and under your arms. Make sure to wash skin folds. 4. Let the lather sit on your skin for 1-2 minutes or as long as told by your health care provider. 5. Using a different clean, wet washcloth, thoroughly rinse your entire body. Make sure that all body creases and crevices are rinsed well. 6. Dry off with a clean towel. Do not put any substances on your body afterward--such as powder, lotion, or perfume--unless you are told to do so by your health care provider. Only use lotions that are recommended by the manufacturer. 7. Put on clean clothes or pajamas. 8. If it is the night before your surgery, sleep in clean sheets. How to use CHG prepackaged cloths  Only use CHG cloths as told by your health care provider, and follow the instructions on the label.  Use the CHG cloth on clean, dry skin.  Do not use the CHG cloth on your head or face unless your health care provider tells you to.  When washing with the CHG  cloth: ? Avoid your genital area. ? Avoid any areas of skin that have broken skin, cuts, or scrapes. Before surgery Follow these steps when using a CHG cloth to clean before surgery (unless your health care provider gives you different instructions): 1. Using the CHG cloth, vigorously scrub the part of your body where you will be having surgery. Scrub using a back-and-forth motion for 3 minutes. The area on your body should be completely wet with CHG when you are done scrubbing. 2. Do not rinse. Discard the cloth and let the area air-dry. Do not put any substances on the area afterward, such as powder, lotion, or perfume. 3. Put on clean clothes or pajamas. 4. If it is the night before your surgery, sleep in clean sheets.  For general bathing Follow these steps when using CHG cloths for general bathing (unless your health care provider gives you different instructions). 1. Use a separate CHG cloth for each area of your body. Make sure you wash between any folds of skin and between your fingers and toes. Wash your body in the following order, switching to a new cloth after each step: ? The front of your neck, shoulders, and chest. ? Both of your arms, under your arms, and your hands. ? Your stomach and groin area, avoiding the genitals. ? Your right leg and foot. ? Your left leg and foot. ? The back of your neck, your back, and your buttocks. 2. Do not rinse. Discard the cloth and let the area air-dry. Do not put any substances on your body afterward--such as powder, lotion, or perfume--unless you are told to do so by your health care provider. Only use lotions that are recommended by the manufacturer. 3. Put on clean clothes or pajamas. Contact a health care provider if:  Your skin gets irritated after scrubbing.  You have questions about using your solution or cloth. Get help right away if:  Your eyes become very red or swollen.  Your eyes itch badly.  Your skin itches badly and is  red or swollen.  Your hearing changes.  You have trouble seeing.  You have swelling or tingling in your mouth or throat.  You have trouble breathing.  You swallow any chlorhexidine. Summary  Chlorhexidine gluconate (CHG) is a germ-killing (antiseptic) solution that is used to clean the skin. Cleaning your skin with CHG may help to lower your risk for infection.  You may be given CHG  to use for bathing. It may be in a bottle or in a prepackaged cloth to use on your skin. Carefully follow your health care provider's instructions and the instructions on the product label.  Do not use CHG if you have a chlorhexidine allergy.  Contact your health care provider if your skin gets irritated after scrubbing. This information is not intended to replace advice given to you by your health care provider. Make sure you discuss any questions you have with your health care provider. Document Revised: 01/15/2019 Document Reviewed: 09/26/2017 Elsevier Patient Education  [place].

## 2020-10-24 ENCOUNTER — Other Ambulatory Visit: Payer: Self-pay | Admitting: Obstetrics & Gynecology

## 2020-10-25 ENCOUNTER — Encounter (HOSPITAL_COMMUNITY)
Admission: RE | Admit: 2020-10-25 | Discharge: 2020-10-25 | Disposition: A | Discharge status: Home / Self Care | Payer: BC Managed Care – PPO | Source: Ambulatory Visit | Attending: Obstetrics & Gynecology | Admitting: Obstetrics & Gynecology

## 2020-10-25 ENCOUNTER — Other Ambulatory Visit: Payer: Self-pay

## 2020-10-25 ENCOUNTER — Other Ambulatory Visit (HOSPITAL_COMMUNITY)
Admission: RE | Admit: 2020-10-25 | Discharge: 2020-10-25 | Disposition: A | Discharge status: Home / Self Care | Payer: BC Managed Care – PPO | Source: Ambulatory Visit | Attending: Obstetrics & Gynecology | Admitting: Obstetrics & Gynecology

## 2020-10-25 ENCOUNTER — Encounter (HOSPITAL_COMMUNITY): Payer: Self-pay

## 2020-10-25 DIAGNOSIS — Z20822 Contact with and (suspected) exposure to covid-19: Secondary | ICD-10-CM | POA: Diagnosis not present

## 2020-10-25 DIAGNOSIS — Z01812 Encounter for preprocedural laboratory examination: Secondary | ICD-10-CM | POA: Diagnosis not present

## 2020-10-25 LAB — URINALYSIS, ROUTINE W REFLEX MICROSCOPIC
Bilirubin Urine: NEGATIVE
Glucose, UA: NEGATIVE mg/dL
Ketones, ur: NEGATIVE mg/dL
Leukocytes,Ua: NEGATIVE
Nitrite: NEGATIVE
Protein, ur: >=300 mg/dL — AB
Specific Gravity, Urine: 1.022 (ref 1.005–1.030)
pH: 6 (ref 5.0–8.0)

## 2020-10-25 LAB — COMPREHENSIVE METABOLIC PANEL
ALT: 22 U/L (ref 0–44)
AST: 22 U/L (ref 15–41)
Albumin: 3.8 g/dL (ref 3.5–5.0)
Alkaline Phosphatase: 39 U/L (ref 38–126)
Anion gap: 7 (ref 5–15)
BUN: 12 mg/dL (ref 6–20)
CO2: 25 mmol/L (ref 22–32)
Calcium: 8.4 mg/dL — ABNORMAL LOW (ref 8.9–10.3)
Chloride: 104 mmol/L (ref 98–111)
Creatinine, Ser: 0.97 mg/dL (ref 0.44–1.00)
GFR, Estimated: >60 mL/min (ref >60)
Glucose, Bld: 96 mg/dL (ref 70–99)
Potassium: 3.6 mmol/L (ref 3.5–5.1)
Sodium: 136 mmol/L (ref 135–145)
Total Bilirubin: 0.4 mg/dL (ref 0.3–1.2)
Total Protein: 6.7 g/dL (ref 6.5–8.1)

## 2020-10-25 LAB — CBC
HCT: 35 % — ABNORMAL LOW (ref 36.0–46.0)
Hemoglobin: 11.4 g/dL — ABNORMAL LOW (ref 12.0–15.0)
MCH: 31.7 pg (ref 26.0–34.0)
MCHC: 32.6 g/dL (ref 30.0–36.0)
MCV: 97.2 fL (ref 80.0–100.0)
Platelets: 243 10*3/uL (ref 150–400)
RBC: 3.6 MIL/uL — ABNORMAL LOW (ref 3.87–5.11)
RDW: 13.7 % (ref 11.5–15.5)
WBC: 6.6 10*3/uL (ref 4.0–10.5)
nRBC: 0 % (ref 0.0–0.2)

## 2020-10-25 LAB — RAPID HIV SCREEN (HIV 1/2 AB+AG)
HIV 1/2 Antibodies: NONREACTIVE
HIV-1 P24 Antigen - HIV24: NONREACTIVE

## 2020-10-25 LAB — SARS CORONAVIRUS 2 (TAT 6-24 HRS): SARS Coronavirus 2: NEGATIVE

## 2020-10-25 LAB — HCG, QUANTITATIVE, PREGNANCY: hCG, Beta Chain, Quant, S: <1 m[IU]/mL (ref <5)

## 2020-10-26 ENCOUNTER — Ambulatory Visit (HOSPITAL_COMMUNITY): Payer: BC Managed Care – PPO | Admitting: Anesthesiology

## 2020-10-26 ENCOUNTER — Encounter (HOSPITAL_COMMUNITY)
Admission: RE | Disposition: A | Discharge status: Home / Self Care | Payer: Self-pay | Source: Home / Self Care | Attending: Obstetrics & Gynecology

## 2020-10-26 ENCOUNTER — Ambulatory Visit (HOSPITAL_COMMUNITY)
Admission: RE | Admit: 2020-10-26 | Discharge: 2020-10-26 | Disposition: A | Discharge status: Home / Self Care | Payer: BC Managed Care – PPO | Attending: Obstetrics & Gynecology | Admitting: Obstetrics & Gynecology

## 2020-10-26 ENCOUNTER — Encounter (HOSPITAL_COMMUNITY): Payer: Self-pay | Admitting: Obstetrics & Gynecology

## 2020-10-26 DIAGNOSIS — Z87891 Personal history of nicotine dependence: Secondary | ICD-10-CM | POA: Insufficient documentation

## 2020-10-26 DIAGNOSIS — N840 Polyp of corpus uteri: Secondary | ICD-10-CM | POA: Insufficient documentation

## 2020-10-26 DIAGNOSIS — Z4003 Encounter for prophylactic removal of fallopian tube(s): Secondary | ICD-10-CM | POA: Diagnosis not present

## 2020-10-26 DIAGNOSIS — Z302 Encounter for sterilization: Secondary | ICD-10-CM | POA: Diagnosis not present

## 2020-10-26 DIAGNOSIS — N946 Dysmenorrhea, unspecified: Secondary | ICD-10-CM

## 2020-10-26 DIAGNOSIS — I10 Essential (primary) hypertension: Secondary | ICD-10-CM | POA: Diagnosis not present

## 2020-10-26 DIAGNOSIS — Z888 Allergy status to other drugs, medicaments and biological substances status: Secondary | ICD-10-CM | POA: Insufficient documentation

## 2020-10-26 DIAGNOSIS — N920 Excessive and frequent menstruation with regular cycle: Secondary | ICD-10-CM | POA: Diagnosis not present

## 2020-10-26 DIAGNOSIS — Z91018 Allergy to other foods: Secondary | ICD-10-CM | POA: Diagnosis not present

## 2020-10-26 DIAGNOSIS — N921 Excessive and frequent menstruation with irregular cycle: Secondary | ICD-10-CM | POA: Insufficient documentation

## 2020-10-26 HISTORY — PX: LAPAROSCOPIC BILATERAL SALPINGECTOMY: SHX5889

## 2020-10-26 HISTORY — PX: DILATATION AND CURETTAGE/HYSTEROSCOPY WITH MINERVA: SHX6851

## 2020-10-26 SURGERY — DILATATION AND CURETTAGE/HYSTEROSCOPY WITH MINERVA
Anesthesia: General

## 2020-10-26 MED ORDER — KETOROLAC TROMETHAMINE 30 MG/ML IJ SOLN
INTRAMUSCULAR | Status: AC
  Filled 2020-10-26: qty 1

## 2020-10-26 MED ORDER — CEFAZOLIN SODIUM-DEXTROSE 2-4 GM/100ML-% IV SOLN
2.0000 g | INTRAVENOUS | Status: AC
  Administered 2020-10-26: 12:00:00 2 g via INTRAVENOUS

## 2020-10-26 MED ORDER — POVIDONE-IODINE 10 % EX SWAB: 2.0000 "application " | Freq: Once | CUTANEOUS | Status: DC

## 2020-10-26 MED ORDER — CEFAZOLIN SODIUM-DEXTROSE 2-4 GM/100ML-% IV SOLN
INTRAVENOUS | Status: AC
  Filled 2020-10-26: qty 100

## 2020-10-26 MED ORDER — FENTANYL CITRATE (PF) 250 MCG/5ML IJ SOLN
INTRAMUSCULAR | Status: AC
  Filled 2020-10-26: qty 5

## 2020-10-26 MED ORDER — ONDANSETRON HCL 4 MG/2ML IJ SOLN: 4.0000 mg | Freq: Once | INTRAMUSCULAR | Status: DC | PRN

## 2020-10-26 MED ORDER — BUPIVACAINE LIPOSOME 1.3 % IJ SUSP: 20.0000 mL | Freq: Once | INTRAMUSCULAR | Status: DC

## 2020-10-26 MED ORDER — ALBUTEROL SULFATE HFA 108 (90 BASE) MCG/ACT IN AERS
INHALATION_SPRAY | RESPIRATORY_TRACT | Status: DC | PRN
  Administered 2020-10-26 (×2): 2 via RESPIRATORY_TRACT

## 2020-10-26 MED ORDER — LACTATED RINGERS IV SOLN
INTRAVENOUS | Status: DC
  Administered 2020-10-26: 11:00:00 1000 mL via INTRAVENOUS

## 2020-10-26 MED ORDER — ONDANSETRON 8 MG PO TBDP: 8.0000 mg | ORAL_TABLET | Freq: Three times a day (TID) | ORAL | 0 refills | Status: DC | PRN

## 2020-10-26 MED ORDER — PROPOFOL 10 MG/ML IV BOLUS
INTRAVENOUS | Status: DC | PRN
  Administered 2020-10-26: 200 mg via INTRAVENOUS
  Administered 2020-10-26: 50 mg via INTRAVENOUS

## 2020-10-26 MED ORDER — DEXAMETHASONE SODIUM PHOSPHATE 10 MG/ML IJ SOLN
INTRAMUSCULAR | Status: DC | PRN
  Administered 2020-10-26: 10 mg via INTRAVENOUS

## 2020-10-26 MED ORDER — HYDROCODONE-ACETAMINOPHEN 5-325 MG PO TABS: 1.0000 | ORAL_TABLET | Freq: Four times a day (QID) | ORAL | 0 refills | Status: DC | PRN

## 2020-10-26 MED ORDER — DEXAMETHASONE SODIUM PHOSPHATE 10 MG/ML IJ SOLN
INTRAMUSCULAR | Status: AC
  Filled 2020-10-26: qty 2

## 2020-10-26 MED ORDER — LIDOCAINE HCL (PF) 2 % IJ SOLN
INTRAMUSCULAR | Status: AC
  Filled 2020-10-26: qty 5

## 2020-10-26 MED ORDER — FENTANYL CITRATE (PF) 100 MCG/2ML IJ SOLN: 25.0000 ug | INTRAMUSCULAR | Status: DC | PRN

## 2020-10-26 MED ORDER — ONDANSETRON HCL 4 MG/2ML IJ SOLN
INTRAMUSCULAR | Status: AC
  Filled 2020-10-26: qty 4

## 2020-10-26 MED ORDER — LIDOCAINE HCL (PF) 2 % IJ SOLN
INTRAMUSCULAR | Status: AC
  Filled 2020-10-26: qty 10

## 2020-10-26 MED ORDER — 0.9 % SODIUM CHLORIDE (POUR BTL) OPTIME
TOPICAL | Status: DC | PRN
  Administered 2020-10-26: 13:00:00 1000 mL

## 2020-10-26 MED ORDER — FENTANYL CITRATE (PF) 100 MCG/2ML IJ SOLN
INTRAMUSCULAR | Status: DC | PRN
  Administered 2020-10-26 (×2): 50 ug via INTRAVENOUS
  Administered 2020-10-26: 100 ug via INTRAVENOUS
  Administered 2020-10-26: 25 ug via INTRAVENOUS
  Administered 2020-10-26 (×2): 50 ug via INTRAVENOUS

## 2020-10-26 MED ORDER — BUPIVACAINE LIPOSOME 1.3 % IJ SUSP
INTRAMUSCULAR | Status: DC | PRN
  Administered 2020-10-26: 20 mL

## 2020-10-26 MED ORDER — PROPOFOL 10 MG/ML IV BOLUS
INTRAVENOUS | Status: AC
  Filled 2020-10-26: qty 20

## 2020-10-26 MED ORDER — ROCURONIUM BROMIDE 10 MG/ML (PF) SYRINGE
PREFILLED_SYRINGE | INTRAVENOUS | Status: DC | PRN
  Administered 2020-10-26: 50 mg via INTRAVENOUS
  Administered 2020-10-26: 10 mg via INTRAVENOUS

## 2020-10-26 MED ORDER — ROCURONIUM BROMIDE 10 MG/ML (PF) SYRINGE
PREFILLED_SYRINGE | INTRAVENOUS | Status: AC
  Filled 2020-10-26: qty 10

## 2020-10-26 MED ORDER — ORAL CARE MOUTH RINSE: 15.0000 mL | Freq: Once | OROMUCOSAL | Status: AC

## 2020-10-26 MED ORDER — KETOROLAC TROMETHAMINE 10 MG PO TABS: 10.0000 mg | ORAL_TABLET | Freq: Three times a day (TID) | ORAL | 0 refills | Status: DC | PRN

## 2020-10-26 MED ORDER — LIDOCAINE HCL (CARDIAC) PF 100 MG/5ML IV SOSY
PREFILLED_SYRINGE | INTRAVENOUS | Status: DC | PRN
  Administered 2020-10-26: 40 mg via INTRATRACHEAL
  Administered 2020-10-26: 80 mg via INTRATRACHEAL

## 2020-10-26 MED ORDER — ALBUTEROL SULFATE HFA 108 (90 BASE) MCG/ACT IN AERS
INHALATION_SPRAY | RESPIRATORY_TRACT | Status: AC
  Filled 2020-10-26: qty 6.7

## 2020-10-26 MED ORDER — FENTANYL CITRATE (PF) 100 MCG/2ML IJ SOLN
INTRAMUSCULAR | Status: AC
  Filled 2020-10-26: qty 2

## 2020-10-26 MED ORDER — CHLORHEXIDINE GLUCONATE 0.12 % MT SOLN
15.0000 mL | Freq: Once | OROMUCOSAL | Status: AC
  Administered 2020-10-26: 11:00:00 15 mL via OROMUCOSAL

## 2020-10-26 MED ORDER — SUGAMMADEX SODIUM 500 MG/5ML IV SOLN
INTRAVENOUS | Status: DC | PRN
  Administered 2020-10-26: 200 mg via INTRAVENOUS

## 2020-10-26 MED ORDER — BUPIVACAINE LIPOSOME 1.3 % IJ SUSP
INTRAMUSCULAR | Status: AC
  Filled 2020-10-26: qty 20

## 2020-10-26 MED ORDER — SODIUM CHLORIDE 0.9 % IR SOLN
Status: DC | PRN
  Administered 2020-10-26: 1000 mL

## 2020-10-26 MED ORDER — KETOROLAC TROMETHAMINE 30 MG/ML IJ SOLN
30.0000 mg | Freq: Once | INTRAMUSCULAR | Status: AC
  Administered 2020-10-26: 11:00:00 30 mg via INTRAVENOUS

## 2020-10-26 MED ORDER — ONDANSETRON HCL 4 MG/2ML IJ SOLN
INTRAMUSCULAR | Status: DC | PRN
  Administered 2020-10-26: 4 mg via INTRAVENOUS

## 2020-10-26 SURGICAL SUPPLY — 55 items
ADH SKN CLS APL DERMABOND .7 (GAUZE/BANDAGES/DRESSINGS) ×2
APPLIER CLIP 5 13 M/L LIGAMAX5 (MISCELLANEOUS)
APR CLP MED LRG 5 ANG JAW (MISCELLANEOUS)
BAG HAMPER (MISCELLANEOUS) ×4 IMPLANT
BLADE SURG SZ11 CARB STEEL (BLADE) ×4 IMPLANT
CATH ROBINSON RED A/P 14FR (CATHETERS) ×4 IMPLANT
CLIP APPLIE 5 13 M/L LIGAMAX5 (MISCELLANEOUS) IMPLANT
CLOTH BEACON ORANGE TIMEOUT ST (SAFETY) ×4 IMPLANT
COVER LIGHT HANDLE STERIS (MISCELLANEOUS) ×8 IMPLANT
COVER WAND RF STERILE (DRAPES) ×8 IMPLANT
DERMABOND ADVANCED (GAUZE/BANDAGES/DRESSINGS) ×2
DERMABOND ADVANCED .7 DNX12 (GAUZE/BANDAGES/DRESSINGS) ×2 IMPLANT
ELECT REM PT RETURN 9FT ADLT (ELECTROSURGICAL) ×4
ELECTRODE REM PT RTRN 9FT ADLT (ELECTROSURGICAL) ×2 IMPLANT
GAUZE 4X4 16PLY RFD (DISPOSABLE) ×8 IMPLANT
GLOVE BIOGEL PI IND STRL 7.0 (GLOVE) ×12 IMPLANT
GLOVE BIOGEL PI IND STRL 8 (GLOVE) ×4 IMPLANT
GLOVE BIOGEL PI INDICATOR 7.0 (GLOVE) ×12
GLOVE BIOGEL PI INDICATOR 8 (GLOVE) ×4
GLOVE ECLIPSE 8.0 STRL XLNG CF (GLOVE) ×8 IMPLANT
GOWN STRL REUS W/TWL LRG LVL3 (GOWN DISPOSABLE) ×8 IMPLANT
GOWN STRL REUS W/TWL XL LVL3 (GOWN DISPOSABLE) ×8 IMPLANT
HANDPIECE ABLA MINERVA ENDO (MISCELLANEOUS) ×4 IMPLANT
INST SET HYSTEROSCOPY (KITS) ×4 IMPLANT
INST SET LAPROSCOPIC GYN AP (KITS) ×4 IMPLANT
IV NS 1000ML (IV SOLUTION) ×4
IV NS 1000ML BAXH (IV SOLUTION) ×2 IMPLANT
KIT TURNOVER CYSTO (KITS) ×4 IMPLANT
MANIFOLD NEPTUNE II (INSTRUMENTS) ×4 IMPLANT
MARKER SKIN DUAL TIP RULER LAB (MISCELLANEOUS) ×4 IMPLANT
NEEDLE HYPO 18GX1.5 BLUNT FILL (NEEDLE) ×4 IMPLANT
NEEDLE HYPO 21X1.5 SAFETY (NEEDLE) ×4 IMPLANT
NEEDLE INSUFFLATION 14GA 120MM (NEEDLE) ×4 IMPLANT
NS IRRIG 1000ML POUR BTL (IV SOLUTION) ×4 IMPLANT
PACK BASIC III (CUSTOM PROCEDURE TRAY) ×4
PACK PERI GYN (CUSTOM PROCEDURE TRAY) ×4 IMPLANT
PACK SRG BSC III STRL LF ECLPS (CUSTOM PROCEDURE TRAY) ×2 IMPLANT
PAD ARMBOARD 7.5X6 YLW CONV (MISCELLANEOUS) ×4 IMPLANT
PAD TELFA 3X4 1S STER (GAUZE/BANDAGES/DRESSINGS) ×4 IMPLANT
SET BASIN LINEN APH (SET/KITS/TRAYS/PACK) ×4 IMPLANT
SET IRRIG Y TYPE TUR BLADDER L (SET/KITS/TRAYS/PACK) ×4 IMPLANT
SET TUBE SMOKE EVAC HIGH FLOW (TUBING) ×4 IMPLANT
SHEARS HARMONIC ACE PLUS 36CM (ENDOMECHANICALS) ×4 IMPLANT
SHEET LAVH (DRAPES) ×4 IMPLANT
SLEEVE ENDOPATH XCEL 5M (ENDOMECHANICALS) ×4 IMPLANT
SOL ANTI FOG 6CC (MISCELLANEOUS) ×2 IMPLANT
SOLUTION ANTI FOG 6CC (MISCELLANEOUS) ×2
SUT VICRYL 0 UR6 27IN ABS (SUTURE) ×4 IMPLANT
SUT VICRYL AB 3-0 FS1 BRD 27IN (SUTURE) ×8 IMPLANT
SYR 10ML LL (SYRINGE) ×4 IMPLANT
SYR 20ML LL LF (SYRINGE) ×8 IMPLANT
TROCAR ENDO BLADELESS 11MM (ENDOMECHANICALS) ×4 IMPLANT
TROCAR XCEL NON-BLD 5MMX100MML (ENDOMECHANICALS) ×4 IMPLANT
WARMER LAPAROSCOPE (MISCELLANEOUS) ×4 IMPLANT
YANKAUER SUCT BULB TIP 10FT TU (MISCELLANEOUS) ×4 IMPLANT

## 2020-10-26 NOTE — Discharge Instructions (Signed)
Endometrial Ablation Endometrial ablation is a procedure that destroys the thin inner layer of the lining of the uterus (endometrium). This procedure may be done:  To stop heavy periods.  To stop bleeding that is causing anemia.  To control irregular bleeding.  To treat bleeding caused by small tumors (fibroids) in the endometrium. This procedure is often an alternative to major surgery, such as removal of the uterus and cervix (hysterectomy). As a result of this procedure:  You may not be able to have children. However, if you are premenopausal (you have not gone through menopause): ? You may still have a small chance of getting pregnant. ? You will need to use a reliable method of birth control after the procedure to prevent pregnancy.  You may stop having a menstrual period, or you may have only a small amount of bleeding during your period. Menstruation may return several years after the procedure. Tell a health care provider about:  Any allergies you have.  All medicines you are taking, including vitamins, herbs, eye drops, creams, and over-the-counter medicines.  Any problems you or family members have had with the use of anesthetic medicines.  Any blood disorders you have.  Any surgeries you have had.  Any medical conditions you have. What are the risks? Generally, this is a safe procedure. However, problems may occur, including:  A hole (perforation) in the uterus or bowel.  Infection of the uterus, bladder, or vagina.  Bleeding.  Damage to other structures or organs.  An air bubble in the lung (air embolus).  Problems with pregnancy after the procedure.  Failure of the procedure.  Decreased ability to diagnose cancer in the endometrium. What happens before the procedure?  You will have tests of your endometrium to make sure there are no pre-cancerous cells or cancer cells present.  You may have an ultrasound of the uterus.  You may be given medicines to  thin the endometrium.  Ask your health care provider about: ? Changing or stopping your regular medicines. This is especially important if you take diabetes medicines or blood thinners. ? Taking medicines such as aspirin and ibuprofen. These medicines can thin your blood. Do not take these medicines before your procedure if your doctor tells you not to.  Plan to have someone take you home from the hospital or clinic. What happens during the procedure?   You will lie on an exam table with your feet and legs supported as in a pelvic exam.  To lower your risk of infection: ? Your health care team will wash or sanitize their hands and put on germ-free (sterile) gloves. ? Your genital area will be washed with soap.  An IV tube will be inserted into one of your veins.  You will be given a medicine to help you relax (sedative).  A surgical instrument with a light and camera (resectoscope) will be inserted into your vagina and moved into your uterus. This allows your surgeon to see inside your uterus.  Endometrial tissue will be removed using one of the following methods: ? Radiofrequency. This method uses a radiofrequency-alternating electric current to remove the endometrium. ? Cryotherapy. This method uses extreme cold to freeze the endometrium. ? Heated-free liquid. This method uses a heated saltwater (saline) solution to remove the endometrium. ? Microwave. This method uses high-energy microwaves to heat up the endometrium and remove it. ? Thermal balloon. This method involves inserting a catheter with a balloon tip into the uterus. The balloon tip is filled with   heated fluid to remove the endometrium. The procedure may vary among health care providers and hospitals. What happens after the procedure?  Your blood pressure, heart rate, breathing rate, and blood oxygen level will be monitored until the medicines you were given have worn off.  As tissue healing occurs, you may notice  vaginal bleeding for 4-6 weeks after the procedure. You may also experience: ? Cramps. ? Thin, watery vaginal discharge that is light pink or brown in color. ? A need to urinate more frequently than usual. ? Nausea.  Do not drive for 24 hours if you were given a sedative.  Do not have sex or insert anything into your vagina until your health care provider approves. Summary  Endometrial ablation is done to treat the many causes of heavy menstrual bleeding.  The procedure may be done only after medications have been tried to control the bleeding.  Plan to have someone take you home from the hospital or clinic. This information is not intended to replace advice given to you by your health care provider. Make sure you discuss any questions you have with your health care provider. Document Revised: 04/15/2018 Document Reviewed: 11/15/2016 Elsevier Patient Education  [place]. Laparoscopic Tubal Ligation, Care After This sheet gives you information about how to care for yourself after your procedure. Your health care provider may also give you more specific instructions. If you have problems or questions, contact your health care provider. What can I expect after the procedure? After the procedure, it is common to have:  A sore throat.  Discomfort in your shoulder.  Mild discomfort or cramping in your abdomen.  Gas pains.  Pain or soreness in the area where the surgical incision was made.  A bloated feeling.  Tiredness.  Nausea.  Vomiting. Follow these instructions at home: Medicines  Take over-the-counter and prescription medicines only as told by your health care provider.  Do not take aspirin because it can cause bleeding.  Ask your health care provider if the medicine prescribed to you: ? Requires you to avoid driving or using heavy machinery. ? Can cause constipation. You may need to take actions to prevent or treat constipation, such as:  Drink enough  fluid to keep your urine pale yellow.  Take over-the-counter or prescription medicines.  Eat foods that are high in fiber, such as beans, whole grains, and fresh fruits and vegetables.  Limit foods that are high in fat and processed sugars, such as fried or sweet foods. Incision care      Follow instructions from your health care provider about how to take care of your incision. Make sure you: ? Wash your hands with soap and water before and after you change your bandage (dressing). If soap and water are not available, use hand sanitizer. ? Change your dressing as told by your health care provider. ? Leave stitches (sutures), skin glue, or adhesive strips in place. These skin closures may need to stay in place for 2 weeks or longer. If adhesive strip edges start to loosen and curl up, you may trim the loose edges. Do not remove adhesive strips completely unless your health care provider tells you to do that.  Check your incision area every day for signs of infection. Check for: ? Redness, swelling, or pain. ? Fluid or blood. ? Warmth. ? Pus or a bad smell. Activity  Rest as told by your health care provider.  Avoid sitting for a long time without moving. Get up to take  short walks every 1-2 hours. This is important to improve blood flow and breathing. Ask for help if you feel weak or unsteady.  Return to your normal activities as told by your health care provider. Ask your health care provider what activities are safe for you. General instructions  Do not take baths, swim, or use a hot tub until your health care provider approves. Ask your health care provider if you may take showers. You may only be allowed to take sponge baths.  Have someone help you with your daily household tasks for the first few days.  Keep all follow-up visits as told by your health care provider. This is important. Contact a health care provider if:  You have redness, swelling, or pain around your  incision.  Your incision feels warm to the touch.  You have pus or a bad smell coming from your incision.  The edges of your incision break open after the sutures have been removed.  Your pain does not improve after 2-3 days.  You have a rash.  You repeatedly become dizzy or light-headed.  Your pain medicine is not helping. Get help right away if you:  Have a fever.  Faint.  Have increasing pain in your abdomen.  Have severe pain in one or both of your shoulders.  Have fluid or blood coming from your sutures or from your vagina.  Have shortness of breath or difficulty breathing.  Have chest pain or leg pain.  Have ongoing nausea, vomiting, or diarrhea. Summary  After the procedure, it is common to have mild discomfort or cramping in your abdomen.  Take over-the-counter and prescription medicines only as told by your health care provider.  Watch for symptoms that should prompt you to call your health care provider.  Keep all follow-up visits as told by your health care provider. This is important. This information is not intended to replace advice given to you by your health care provider. Make sure you discuss any questions you have with your health care provider. Document Revised: 04/07/2019 Document Reviewed: 09/23/2018 Elsevier Patient Education  2020 [place] [employer] [place] Sunday October 30, 2020. DO NOT USE ANY ADDITIONAL NUMBING MEDICATIONS UNTIL AFTER Sunday October 30, 2020  Bupivacaine Liposomal Suspension for Injection What is this medicine? BUPIVACAINE LIPOSOMAL (bue PIV a kane LIP oh som al) is an anesthetic. It causes loss of feeling in the skin or other tissues. It is used to prevent and to treat pain from some procedures. This medicine may be used for other purposes; ask your health care provider or pharmacist if you have questions. COMMON BRAND NAME(S): EXPAREL What should I tell my health care provider  before I take this medicine? They need to know if you have any of these conditions:  G6PD deficiency  heart disease  kidney disease  liver disease  low blood pressure  lung or breathing disease, like asthma  an unusual or allergic reaction to bupivacaine, other medicines, foods, dyes, or preservatives  pregnant or trying to get pregnant  breast-feeding How should I use this medicine? This medicine is for injection into the affected area. It is given by a health care professional in a hospital or clinic setting. Talk to your pediatrician regarding the use of this medicine in children. Special care may be needed. Overdosage: If you think you have taken too much of this medicine contact a poison control center or emergency room at once. NOTE: This medicine is only  for you. Do not share this medicine with others. What if I miss a dose? This does not apply. What may interact with this medicine? This medicine may interact with the following medications:  acetaminophen  certain antibiotics like dapsone, nitrofurantoin, aminosalicylic acid, sulfonamides  certain medicines for seizures like phenobarbital, phenytoin, valproic acid  chloroquine  cyclophosphamide  flutamide  hydroxyurea  ifosfamide  metoclopramide  nitric oxide  nitroglycerin  nitroprusside  nitrous oxide  other local anesthetics like lidocaine, pramoxine, tetracaine  primaquine  quinine  rasburicase  sulfasalazine This list may not describe all possible interactions. Give your health care provider a list of all the medicines, herbs, non-prescription drugs, or dietary supplements you use. Also tell them if you smoke, drink alcohol, or use illegal drugs. Some items may interact with your medicine. What should I watch for while using this medicine? Your condition will be monitored carefully while you are receiving this medicine. Be careful to avoid injury while the area is numb, and you are not  aware of pain. What side effects may I notice from receiving this medicine? Side effects that you should report to your doctor or health care professional as soon as possible:  allergic reactions like skin rash, itching or hives, swelling of the face, lips, or tongue  seizures  signs and symptoms of a dangerous change in heartbeat or heart rhythm like chest pain; dizziness; fast, irregular heartbeat; palpitations; feeling faint or lightheaded; falls; breathing problems  signs and symptoms of methemoglobinemia such as pale, gray, or blue colored skin; headache; fast heartbeat; shortness of breath; feeling faint or lightheaded, falls; tiredness Side effects that usually do not require medical attention (report to your doctor or health care professional if they continue or are bothersome):  anxious  back pain  changes in taste  changes in vision  constipation  dizziness  fever  nausea, vomiting This list may not describe all possible side effects. Call your doctor for medical advice about side effects. You may report side effects to FDA at 1-800-FDA-1088. Where should I keep my medicine? This drug is given in a hospital or clinic and will not be stored at home. NOTE: This sheet is a summary. It may not cover all possible information. If you have questions about this medicine, talk to your doctor, pharmacist, or health care provider.  2020 Elsevier/Gold Standard (2019-08-11 10:48:23)    Ketorolac tablets What is this medicine? KETOROLAC (kee toe ROLE ak) is a non-steroidal anti-inflammatory drug (NSAID). It is used for a short while to treat moderate to severe pain, including pain after surgery. It should not be used for more than 5 days. This medicine may be used for other purposes; ask your health care provider or pharmacist if you have questions. COMMON BRAND NAME(S): Toradol What should I tell my health care provider before I take this medicine? They need to know if you  have any of these conditions:  bleeding disorders  cigarette smoker  coronary artery bypass graft (CABG) surgery within the past 2 weeks  drink more than 3 alcohol-containing drinks a day  heart disease  high blood pressure  history of stomach bleeding  kidney disease  liver disease  lung or breathing disease, like asthma  stomach or intestine problems  an unusual or allergic reaction to ketorolac, aspirin, other NSAIDs, other medicines, foods, dyes, or preservatives  pregnant or trying to get pregnant  breast-feeding How should I use this medicine? Take this medicine by mouth with a full glass of water.  Follow the directions on the prescription label. Take your medicine at regular intervals. Do not take your medicine more often than directed. Do not take more than the recommended dose. A special MedGuide will be given to you by the pharmacist with each prescription and refill. Be sure to read this information carefully each time. Talk to your pediatrician regarding the use of this medicine in children. While this drug may be prescribed for children as young as 19 years of age for selected conditions, precautions do apply. Patients over 69 years old may have a stronger reaction and need a smaller dose. Overdosage: If you think you have taken too much of this medicine contact a poison control center or emergency room at once. NOTE: This medicine is only for you. Do not share this medicine with others. What if I miss a dose? If you miss a dose, take it as soon as you can. If it is almost time for your next dose, take only that dose. Do not take double or extra doses. What may interact with this medicine? Do not take this medicine with any of the following medications:  aspirin and aspirin-like medicines  cidofovir  methotrexate  NSAIDs, medicines for pain and inflammation, like ibuprofen or naproxen  pemetrexed  probenecid This medicine may also interact with the  following medications:  alcohol  alendronate  alprazolam  carbamazepine  cyclosporine  diuretics  flavocoxid  fluoxetine  ginkgo  lithium  medicines for high blood pressure like enalapril  medicines that affect platelets like pentoxifylline  medicines that treat or prevent blood clots like heparin, warfarin  muscle relaxants  phenytoin  steroid medicines like prednisone or cortisone  thiothixene This list may not describe all possible interactions. Give your health care provider a list of all the medicines, herbs, non-prescription drugs, or dietary supplements you use. Also tell them if you smoke, drink alcohol, or use illegal drugs. Some items may interact with your medicine. What should I watch for while using this medicine? Tell your doctor or healthcare provider if your pain does not get better. Talk to your doctor before taking another medicine for pain. Do not treat yourself. This medicine may cause serious skin reactions. They can happen weeks to months after starting the medicine. Contact your healthcare provider right away if you notice fevers or flu-like symptoms with a rash. The rash may be red or purple and then turn into blisters or peeling of the skin. Or, you might notice a red rash with swelling of the face, lips or lymph nodes in your neck or under your arms. This medicine does not prevent heart attack or stroke. In fact, this medicine may increase the chance of a heart attack or stroke. The chance may increase with longer use of this medicine and in people who have heart disease. If you take aspirin to prevent heart attack or stroke, talk with your doctor or healthcare provider. Do not take medicines such as ibuprofen and naproxen with this medicine. Side effects such as stomach upset, nausea, or ulcers may be more likely to occur. Many medicines available without a prescription should not be taken with this medicine. This medicine can cause ulcers and  bleeding in the stomach and intestines at any time during treatment. Do not smoke cigarettes or drink alcohol. These increase irritation to your stomach and can make it more susceptible to damage from this medicine. Ulcers and bleeding can happen without warning symptoms and can cause death. You may get drowsy or dizzy.  Do not drive, use machinery, or do anything that needs mental alertness until you know how this medicine affects you. Do not stand or sit up quickly, especially if you are an older patient. This reduces the risk of dizzy or fainting spells. This medicine can cause you to bleed more easily. Try to avoid damage to your teeth and gums when you brush or floss your teeth. What side effects may I notice from receiving this medicine? Side effects that you should report to your doctor or health care professional as soon as possible:  allergic reactions like skin rash, itching or hives, swelling of the face, lips, or tongue  breathing problems  high blood pressure  nausea, vomiting  redness, blistering, peeling or loosening of the skin, including inside the mouth  severe stomach pain  signs and symptoms of bleeding such as bloody or black, tarry stools; red or dark-brown urine; spitting up blood or brown material that looks like coffee grounds; red spots on the skin; unusual bruising or bleeding from the eye, gums, or nose  signs and symptoms of a stroke like changes in vision; confusion; trouble speaking or understanding; severe headaches; sudden numbness or weakness of the face, arm or leg; trouble walking; dizziness; loss of balance or coordination  trouble passing urine or change in the amount of urine  unexplained weight gain or swelling  unusually weak or tired  yellowing of eyes or skin Side effects that usually do not require medical attention (report to your doctor or health care professional if they continue or are  bothersome):  diarrhea  dizziness  headache  heartburn This list may not describe all possible side effects. Call your doctor for medical advice about side effects. You may report side effects to FDA at 1-800-FDA-1088. Where should I keep my medicine? Keep out of the reach of children. Store at room temperature between 20 and 25 degrees C (68 and 77 degrees F). Throw away any unused medicine after the expiration date. NOTE: This sheet is a summary. It may not cover all possible information. If you have questions about this medicine, talk to your doctor, pharmacist, or health care provider.  2020 Elsevier/Gold Standard (2019-01-14 15:15:50)    Acetaminophen; Hydrocodone tablets or capsules What is this medicine? ACETAMINOPHEN; HYDROCODONE (a set a MEE noe fen; hye droe KOE done) is a pain reliever. It is used to treat moderate to severe pain. This medicine may be used for other purposes; ask your health care provider or pharmacist if you have questions. COMMON BRAND NAME(S): Anexsia, Bancap HC, Ceta-Plus, Co-Gesic, Comfortpak, Dolagesic, [employer], DuoCet, Hydrocet, Hydrogesic, [place], Lorcet HD, Lorcet Plus, Lortab, Margesic H, Maxidone, [place], Polygesic, [place], [place], [place], Vicodin, Vicodin ES, Vicodin HP, Charlane Ferretti What should I tell my health care provider before I take this medicine? They need to know if you have any of these conditions:  brain tumor  Crohn's disease, inflammatory bowel disease, or ulcerative colitis  drug abuse or addiction  head injury  heart or circulation problems  if you often drink alcohol  kidney disease or problems going to the bathroom  liver disease  lung disease, asthma, or breathing problems  an unusual or allergic reaction to acetaminophen, hydrocodone, other opioid analgesics, other medicines, foods, dyes, or preservatives  pregnant or trying to get pregnant  breast-feeding How should I use this medicine? Take  this medicine by mouth with a glass of water. Follow the directions on the prescription label. You can take it with or without  food. If it upsets your stomach, take it with food. Do not take your medicine more often than directed. A special MedGuide will be given to you by the pharmacist with each prescription and refill. Be sure to read this information carefully each time. Talk to your pediatrician regarding the use of this medicine in children. Special care may be needed. Overdosage: If you think you have taken too much of this medicine contact a poison control center or emergency room at once. NOTE: This medicine is only for you. Do not share this medicine with others. What if I miss a dose? If you miss a dose, take it as soon as you can. If it is almost time for your next dose, take only that dose. Do not take double or extra doses. What may interact with this medicine? This medicine may interact with the following medications:  alcohol  antiviral medicines for HIV or AIDS  atropine  antihistamines for allergy, cough and cold  certain antibiotics like erythromycin, clarithromycin  certain medicines for anxiety or sleep  certain medicines for bladder problems like oxybutynin, tolterodine  certain medicines for depression like amitriptyline, fluoxetine, sertraline  certain medicines for fungal infections like ketoconazole and itraconazole  certain medicines for Parkinson's disease like benztropine, trihexyphenidyl  certain medicines for seizures like carbamazepine, phenobarbital, phenytoin, primidone  certain medicines for stomach problems like dicyclomine, hyoscyamine  certain medicines for travel sickness like scopolamine  general anesthetics like halothane, isoflurane, methoxyflurane, propofol  ipratropium  local anesthetics like lidocaine, pramoxine, tetracaine  MAOIs like Carbex, Eldepryl, Marplan, Nardil, and Parnate  medicines that relax muscles for  surgery  other medicines with acetaminophen  other narcotic medicines for pain or cough  phenothiazines like chlorpromazine, mesoridazine, prochlorperazine, thioridazine  rifampin This list may not describe all possible interactions. Give your health care provider a list of all the medicines, herbs, non-prescription drugs, or dietary supplements you use. Also tell them if you smoke, drink alcohol, or use illegal drugs. Some items may interact with your medicine. What should I watch for while using this medicine? Tell your health care provider if your pain does not go away, if it gets worse, or if you have new or a different type of pain. You may develop tolerance to this drug. Tolerance means that you will need a higher dose of the drug for pain relief. Tolerance is normal and is expected if you take this drug for a long time. There are different types of narcotic drugs (opioids) for pain. If you take more than one type at the same time, you may have more side effects. Give your health care provider a list of all drugs you use. He or she will tell you how much drug to take. Do not take more drug than directed. Get emergency help right away if you have problems breathing. Do not suddenly stop taking your drug because you may develop a severe reaction. Your body becomes used to the drug. This does NOT mean you are addicted. Addiction is a behavior related to getting and using a drug for a nonmedical reason. If you have pain, you have a medical reason to take pain drug. Your health care provider will tell you how much drug to take. If your health care provider wants you to stop the drug, the dose will be slowly lowered over time to avoid any side effects. Talk to your health care provider about naloxone and how to get it. Naloxone is an emergency drug used for an opioid  overdose. An overdose can happen if you take too much opioid. It can also happen if an opioid is taken with some other drugs or  substances, like alcohol. Know the symptoms of an overdose, like trouble breathing, unusually tired or sleepy, or not being able to respond or wake up. Make sure to tell caregivers and close contacts where it is stored. Make sure they know how to use it. After naloxone is given, you must get emergency help right away. Naloxone is a temporary treatment. Repeat doses may be needed. Do not take other drugs that contain acetaminophen with this drug. Many non-prescription drugs contain acetaminophen. Always read labels carefully. If you have questions, ask your health care provider. If you take too much acetaminophen, get medical help right away. Too much acetaminophen can be very dangerous and cause liver damage. Even if you do not have symptoms, it is important to get help right away. You may get drowsy or dizzy. Do not drive, use machinery, or do anything that needs mental alertness until you know how this drug affects you. Do not stand up or sit up quickly, especially if you are an older patient. This reduces the risk of dizzy or fainting spells. Alcohol may interfere with the effect of this drug. Avoid alcoholic drinks. This drug will cause constipation. If you do not have a bowel movement for 3 days, call your health care provider. Your mouth may get dry. Chewing sugarless gum or sucking hard candy and drinking plenty of water may help. Contact your health care provider if the problem does not go away or is severe. What side effects may I notice from receiving this medicine? Side effects that you should report to your doctor or health care professional as soon as possible:  allergic reactions like skin rash, itching or hives, swelling of the face, lips, or tongue  breathing problems  confusion  redness, blistering, peeling or loosening of the skin, including inside the mouth  signs and symptoms of low blood pressure like dizziness; feeling faint or lightheaded, falls; unusually weak or  tired  trouble passing urine or change in the amount of urine  yellowing of the eyes or skin Side effects that usually do not require medical attention (report to your doctor or health care professional if they continue or are bothersome):  constipation  dry mouth  nausea, vomiting  tiredness This list may not describe all possible side effects. Call your doctor for medical advice about side effects. You may report side effects to FDA at 1-800-FDA-1088. Where should I keep my medicine? Keep out of the reach of children. This medicine can be abused. Keep your medicine in a safe place to protect it from theft. Do not share this medicine with anyone. Selling or giving away this medicine is dangerous and against the law. Store at room temperature between 15 and 30 degrees C (59 and 86 degrees F). This medicine may cause harm and death if it is taken by other adults, children, or pets. Return medicine that has not been used to an official disposal site. Contact the DEA at 205-882-9545 or your city/county government to find a site. If you cannot return the medicine, flush it down the toilet. Do not use the medicine after the expiration date. NOTE: This sheet is a summary. It may not cover all possible information. If you have questions about this medicine, talk to your doctor, pharmacist, or health care provider.  2020 Elsevier/Gold Standard (2019-06-08 12:05:41)   Ondansetron oral dissolving  tablet What is this medicine? ONDANSETRON (on DAN se tron) is used to treat nausea and vomiting caused by chemotherapy. It is also used to prevent or treat nausea and vomiting after surgery. This medicine may be used for other purposes; ask your health care provider or pharmacist if you have questions. COMMON BRAND NAME(S): Zofran ODT What should I tell my health care provider before I take this medicine? They need to know if you have any of these conditions:  heart disease  history of irregular  heartbeat  liver disease  low levels of magnesium or potassium in the blood  an unusual or allergic reaction to ondansetron, granisetron, other medicines, foods, dyes, or preservatives  pregnant or trying to get pregnant  breast-feeding How should I use this medicine? These tablets are made to dissolve in the mouth. Do not try to push the tablet through the foil backing. With dry hands, peel away the foil backing and gently remove the tablet. Place the tablet in the mouth and allow it to dissolve, then swallow. While you may take these tablets with water, it is not necessary to do so. Talk to your pediatrician regarding the use of this medicine in children. Special care may be needed. Overdosage: If you think you have taken too much of this medicine contact a poison control center or emergency room at once. NOTE: This medicine is only for you. Do not share this medicine with others. What if I miss a dose? If you miss a dose, take it as soon as you can. If it is almost time for your next dose, take only that dose. Do not take double or extra doses. What may interact with this medicine? Do not take this medicine with any of the following medications:  apomorphine  certain medicines for fungal infections like fluconazole, itraconazole, ketoconazole, posaconazole, voriconazole  cisapride  dronedarone  pimozide  thioridazine This medicine may also interact with the following medications:  carbamazepine  certain medicines for depression, anxiety, or psychotic disturbances  fentanyl  linezolid  MAOIs like Carbex, Eldepryl, Marplan, Nardil, and Parnate  methylene blue (injected into a vein)  other medicines that prolong the QT interval (cause an abnormal heart rhythm) like dofetilide, ziprasidone  phenytoin  rifampicin  tramadol This list may not describe all possible interactions. Give your health care provider a list of all the medicines, herbs, non-prescription drugs,  or dietary supplements you use. Also tell them if you smoke, drink alcohol, or use illegal drugs. Some items may interact with your medicine. What should I watch for while using this medicine? Check with your doctor or health care professional as soon as you can if you have any sign of an allergic reaction. What side effects may I notice from receiving this medicine? Side effects that you should report to your doctor or health care professional as soon as possible:  allergic reactions like skin rash, itching or hives, swelling of the face, lips, or tongue  breathing problems  confusion  dizziness  fast or irregular heartbeat  feeling faint or lightheaded, falls  fever and chills  loss of balance or coordination  seizures  sweating  swelling of the hands and feet  tightness in the chest  tremors  unusually weak or tired Side effects that usually do not require medical attention (report to your doctor or health care professional if they continue or are bothersome):  constipation or diarrhea  headache This list may not describe all possible side effects. Call your doctor for  medical advice about side effects. You may report side effects to FDA at 1-800-FDA-1088. Where should I keep my medicine? Keep out of the reach of children. Store between 2 and 30 degrees C (36 and 86 degrees F). Throw away any unused medicine after the expiration date. NOTE: This sheet is a summary. It may not cover all possible information. If you have questions about this medicine, talk to your doctor, pharmacist, or health care provider.  2020 Elsevier/Gold Standard (2018-10-21 07:14:10)     General Anesthesia, Adult, Care After This sheet gives you information about how to care for yourself after your procedure. Your health care provider may also give you more specific instructions. If you have problems or questions, contact your health care provider. What can I expect after the  procedure? After the procedure, the following side effects are common:  Pain or discomfort at the IV site.  Nausea.  Vomiting.  Sore throat.  Trouble concentrating.  Feeling cold or chills.  Weak or tired.  Sleepiness and fatigue.  Soreness and body aches. These side effects can affect parts of the body that were not involved in surgery. Follow these instructions at home:  For at least 24 hours after the procedure:  Have a responsible adult stay with you. It is important to have someone help care for you until you are awake and alert.  Rest as needed.  Do not: ? Participate in activities in which you could fall or become injured. ? Drive. ? Use heavy machinery. ? Drink alcohol. ? Take sleeping pills or medicines that cause drowsiness. ? Make important decisions or sign legal documents. ? Take care of children on your own. Eating and drinking  Follow any instructions from your health care provider about eating or drinking restrictions.  When you feel hungry, start by eating small amounts of foods that are soft and easy to digest (bland), such as toast. Gradually return to your regular diet.  Drink enough fluid to keep your urine pale yellow.  If you vomit, rehydrate by drinking water, juice, or clear broth. General instructions  If you have sleep apnea, surgery and certain medicines can increase your risk for breathing problems. Follow instructions from your health care provider about wearing your sleep device: ? Anytime you are sleeping, including during daytime naps. ? While taking prescription pain medicines, sleeping medicines, or medicines that make you drowsy.  Return to your normal activities as told by your health care provider. Ask your health care provider what activities are safe for you.  Take over-the-counter and prescription medicines only as told by your health care provider.  If you smoke, do not smoke without supervision.  Keep all follow-up  visits as told by your health care provider. This is important. Contact a health care provider if:  You have nausea or vomiting that does not get better with medicine.  You cannot eat or drink without vomiting.  You have pain that does not get better with medicine.  You are unable to pass urine.  You develop a skin rash.  You have a fever.  You have redness around your IV site that gets worse. Get help right away if:  You have difficulty breathing.  You have chest pain.  You have blood in your urine or stool, or you vomit blood. Summary  After the procedure, it is common to have a sore throat or nausea. It is also common to feel tired.  Have a responsible adult stay with you for the first  24 hours after general anesthesia. It is important to have someone help care for you until you are awake and alert.  When you feel hungry, start by eating small amounts of foods that are soft and easy to digest (bland), such as toast. Gradually return to your regular diet.  Drink enough fluid to keep your urine pale yellow.  Return to your normal activities as told by your health care provider. Ask your health care provider what activities are safe for you. This information is not intended to replace advice given to you by your health care provider. Make sure you discuss any questions you have with your health care provider. Document Revised: 11/01/2017 Document Reviewed: 06/14/2017 Elsevier Patient Education  [place].

## 2020-10-26 NOTE — Transfer of Care (Signed)
Immediate Anesthesia Transfer of Care Note  Patient: Marcia Hahn  Procedure(s) Performed: DILATATION AND CURETTAGE/HYSTEROSCOPY WITH MINERVA ENDOMETRIAL ABLATION (N/A ) LAPAROSCOPIC BILATERAL SALPINGECTOMY (Bilateral )  Patient Location: PACU  Anesthesia Type:General  Level of Consciousness: awake and alert   Airway & Oxygen Therapy: Patient Spontanous Breathing and Patient connected to nasal cannula oxygen  Post-op Assessment: Report given to RN and Post -op Vital signs reviewed and stable  Post vital signs: Reviewed and stable  Last Vitals:  Vitals Value Taken Time  BP 125/71 10/26/20 1335  Temp    Pulse 92 10/26/20 1335  Resp 21 10/26/20 1335  SpO2 98 % 10/26/20 1335  Vitals shown include unvalidated device data.  Last Pain:  Vitals:   10/26/20 0952  TempSrc: Oral  PainSc: 0-No pain      Patients Stated Pain Goal: 8 (60/47/99 8721)  Complications: No complications documented.

## 2020-10-26 NOTE — H&P (Signed)
Preoperative History and Physical  Marcia Hahn is a 44 y.o. [id number] with No LMP recorded. admitted for a hysteroscopy uterine curettage Minerva endometrial ablation and laparoscopic bilateral salpingectomy for permanent sterilization.  We placed an IUD for management of both but she decided she wanted to proceed with more definitive surgical management.  PMH:    Past Medical History:  Diagnosis Date  . Abnormal mammogram of right breast 09/03/2018  . Acne vulgaris   . Hypertension   . Mild obesity     PSH:     Past Surgical History:  Procedure Laterality Date  . BREAST BIOPSY Left   . CESAREAN SECTION      POb/GynH:      OB History    Gravida  1   Para  1   Term      Preterm  1   AB      Living  2     SAB      IAB      Ectopic      Multiple  1   Live Births  2           SH:   Social History   Tobacco Use  . Smoking status: Former Smoker    Types: Cigarettes    Quit date: 12/04/2012    Years since quitting: 7.8  . Smokeless tobacco: Never Used  Vaping Use  . Vaping Use: Never used  Substance Use Topics  . Alcohol use: Yes    Comment: occasional  . Drug use: No    FH:    Family History  Problem Relation Age of Onset  . GER disease Mother   . Diabetes Mother   . Hypertension Mother   . Heart attack Father 87  . Heart disease Father   . Migraines Sister   . Breast cancer Maternal Grandmother   . Stroke Maternal Grandmother   . Breast cancer Cousin   . Stroke Maternal Aunt      Allergies:  Allergies  Allergen Reactions  . Amlodipine     Headache   . Tomato     Processed tomatoes in the can.     Medications:       Current Facility-Administered Medications:  .  bupivacaine liposome (EXPAREL) 1.3 % injection 266 mg, 20 mL, Infiltration, Once, Lanaiya Lantry H, MD .  ceFAZolin (ANCEF) IVPB 2g/100 mL premix, 2 g, Intravenous, On Call to OR, Florian Buff, MD .  lactated ringers infusion, , Intravenous, Continuous, [place],  Coralie Keens, MD, Last Rate: 10 mL/hr at 10/26/20 1125, Continued from Pre-op at 10/26/20 1125 .  povidone-iodine 10 % swab 2 application, 2 application, Topical, Once, Barbee Mamula, Mertie Clause, MD  Review of Systems:   Review of Systems  Constitutional: Negative for fever, chills, weight loss, malaise/fatigue and diaphoresis.  HENT: Negative for hearing loss, ear pain, nosebleeds, congestion, sore throat, neck pain, tinnitus and ear discharge.   Eyes: Negative for blurred vision, double vision, photophobia, pain, discharge and redness.  Respiratory: Negative for cough, hemoptysis, sputum production, shortness of breath, wheezing and stridor.   Cardiovascular: Negative for chest pain, palpitations, orthopnea, claudication, leg swelling and PND.  Gastrointestinal: Positive for abdominal pain. Negative for heartburn, nausea, vomiting, diarrhea, constipation, blood in stool and melena.  Genitourinary: Negative for dysuria, urgency, frequency, hematuria and flank pain.  Musculoskeletal: Negative for myalgias, back pain, joint pain and falls.  Skin: Negative for itching and rash.  Neurological: Negative for dizziness, tingling, tremors, sensory change,  speech change, focal weakness, seizures, loss of consciousness, weakness and headaches.  Endo/Heme/Allergies: Negative for environmental allergies and polydipsia. Does not bruise/bleed easily.  Psychiatric/Behavioral: Negative for depression, suicidal ideas, hallucinations, memory loss and substance abuse. The patient is not nervous/anxious and does not have insomnia.      PHYSICAL EXAM:  Blood pressure 132/74, temperature 98.4 F (36.9 C), temperature source Oral, resp. rate 16, SpO2 97 %.    Vitals reviewed. Constitutional: She is oriented to person, place, and time. She appears well-developed and well-nourished.  HENT:  Head: Normocephalic and atraumatic.  Right Ear: External ear normal.  Left Ear: External ear normal.  Nose: Nose normal.   Mouth/Throat: Oropharynx is clear and moist.  Eyes: Conjunctivae and EOM are normal. Pupils are equal, round, and reactive to light. Right eye exhibits no discharge. Left eye exhibits no discharge. No scleral icterus.  Neck: Normal range of motion. Neck supple. No tracheal deviation present. No thyromegaly present.  Cardiovascular: Normal rate, regular rhythm, normal heart sounds and intact distal pulses.  Exam reveals no gallop and no friction rub.   No murmur heard. Respiratory: Effort normal and breath sounds normal. No respiratory distress. She has no wheezes. She has no rales. She exhibits no tenderness.  GI: Soft. Bowel sounds are normal. She exhibits no distension and no mass. There is tenderness. There is no rebound and no guarding.  Genitourinary:       Vulva is normal without lesions Vagina is pink moist without discharge Cervix normal in appearance and pap is normal Uterus is normal size, contour, position, consistency, mobility, non-tender Adnexa is negative with normal sized ovaries by sonogram  Musculoskeletal: Normal range of motion. She exhibits no edema and no tenderness.  Neurological: She is alert and oriented to person, place, and time. She has normal reflexes. She displays normal reflexes. No cranial nerve deficit. She exhibits normal muscle tone. Coordination normal.  Skin: Skin is warm and dry. No rash noted. No erythema. No pallor.  Psychiatric: She has a normal mood and affect. Her behavior is normal. Judgment and thought content normal.    Labs: Results for orders placed or performed during the hospital encounter of 10/25/20 (from the past 336 hour(s))  SARS CORONAVIRUS 2 (TAT 6-24 HRS) Nasopharyngeal Nasopharyngeal Swab   Collection Time: 10/25/20 11:08 AM   Specimen: Nasopharyngeal Swab  Result Value Ref Range   SARS Coronavirus 2 NEGATIVE NEGATIVE  CBC   Collection Time: 10/25/20 11:08 AM  Result Value Ref Range   WBC 6.6 4.0 - 10.5 K/uL   RBC 3.60 (L)  3.87 - 5.11 MIL/uL   Hemoglobin 11.4 (L) 12.0 - 15.0 g/dL   HCT 35.0 (L) 36.0 - 46.0 %   MCV 97.2 80.0 - 100.0 fL   MCH 31.7 26.0 - 34.0 pg   MCHC 32.6 30.0 - 36.0 g/dL   RDW 13.7 11.5 - 15.5 %   Platelets 243 150 - 400 K/uL   nRBC 0.0 0.0 - 0.2 %  Comprehensive metabolic panel   Collection Time: 10/25/20 11:08 AM  Result Value Ref Range   Sodium 136 135 - 145 mmol/L   Potassium 3.6 3.5 - 5.1 mmol/L   Chloride 104 98 - 111 mmol/L   CO2 25 22 - 32 mmol/L   Glucose, Bld 96 70 - 99 mg/dL   BUN 12 6 - 20 mg/dL   Creatinine, Ser 0.97 0.44 - 1.00 mg/dL   Calcium 8.4 (L) 8.9 - 10.3 mg/dL   Total Protein 6.7 6.5 -  8.1 g/dL   Albumin 3.8 3.5 - 5.0 g/dL   AST 22 15 - 41 U/L   ALT 22 0 - 44 U/L   Alkaline Phosphatase 39 38 - 126 U/L   Total Bilirubin 0.4 0.3 - 1.2 mg/dL   GFR, Estimated >60 >60 mL/min   Anion gap 7 5 - 15  hCG, quantitative, pregnancy   Collection Time: 10/25/20 11:08 AM  Result Value Ref Range   hCG, Beta Chain, Quant, S <1 <5 mIU/mL  Rapid HIV screen (HIV 1/2 Ab+Ag)   Collection Time: 10/25/20 11:08 AM  Result Value Ref Range   HIV-1 P24 Antigen - HIV24 NON REACTIVE NON REACTIVE   HIV 1/2 Antibodies NON REACTIVE NON REACTIVE   Interpretation (HIV Ag Ab)      A non reactive test result means that HIV 1 or HIV 2 antibodies and HIV 1 p24 antigen were not detected in the specimen.  Urinalysis, Routine w reflex microscopic Urine, Clean Catch   Collection Time: 10/25/20 11:08 AM  Result Value Ref Range   Color, Urine YELLOW YELLOW   APPearance HAZY (A) CLEAR   Specific Gravity, Urine 1.022 1.005 - 1.030   pH 6.0 5.0 - 8.0   Glucose, UA NEGATIVE NEGATIVE mg/dL   Hgb urine dipstick SMALL (A) NEGATIVE   Bilirubin Urine NEGATIVE NEGATIVE   Ketones, ur NEGATIVE NEGATIVE mg/dL   Protein, ur >=300 (A) NEGATIVE mg/dL   Nitrite NEGATIVE NEGATIVE   Leukocytes,Ua NEGATIVE NEGATIVE   RBC / HPF 0-5 0 - 5 RBC/hpf   WBC, UA 0-5 0 - 5 WBC/hpf   Bacteria, UA RARE (A) NONE SEEN    Squamous Epithelial / LPF 11-20 0 - 5   Mucus PRESENT     EKG: Orders placed or performed during the hospital encounter of 02/08/20  . EKG 12-Lead  . EKG 12-Lead  . EKG    Imaging Studies: No results found.    Assessment: Menometrorrhagia Dysmenorrhea Desires permanent sterilization, opts for bilateral salpingectomy   Patient Active Problem List   Diagnosis Date Noted  . Environmental and seasonal allergies 08/14/2020  . Localized swelling, mass and lump, neck 05/02/2020  . CML (chronic myelocytic leukemia) ([place]) 03/08/2020  . Benign essential HTN 02/08/2020  . Anemia 02/08/2020  . IDA (iron deficiency anemia) 06/12/2017  . Dyslipidemia (high LDL; low HDL) 06/12/2017  . Metabolic syndrome X 51/76/1607  . Annual physical exam 06/11/2017  . Menorrhagia with regular cycle 03/07/2015  . Osteochondral defect of ankle 12/11/2012  . Prediabetes 12/10/2012  . Vitamin D deficiency 11/17/2012  . Obesity (BMI 30.0-34.9) 08/14/2009  . Chest pain 09/03/2008    Plan: Hysteroscopy uterine curettage Minerva endometrial ablation Laparoscopic Bilateral salpingectomy for permanent sterilization  Florian Buff 10/26/2020 11:30 AM

## 2020-10-26 NOTE — Op Note (Signed)
Preoperative diagnosis:  1.   Menometrorrhagia                                         2.  Dysmenorrhea   Postoperative diagnoses: Same as above   Procedure: Hysteroscopy, uterine curettage, endometrial ablation using Minerva  Surgeon: Florian Buff   Anesthesia: Laryngeal mask airway  Findings: The endometrium was normal. There were no fibroid or other abnormalities.  Description of operation: The patient was taken to the operating room and placed in the supine position. She underwent general anesthesia using the laryngeal mask airway. She was placed in the dorsal lithotomy position and prepped and draped in the usual sterile fashion. A Graves speculum was placed and the anterior cervical lip was grasped with a single-tooth tenaculum. The cervix was dilated serially to allow passage of the hysteroscope. Diagnostic hysteroscopy was performed and was found to be normal. A vigorous uterine curettage was then performed and all tissue sent to pathology for evaluation.  I then proceeded to perform the Minerva endometrial ablation.   The uterus sounded to 9.5 cm The handpiece was attached to the Minerva power source/machine and the handpiece passed the checklist. The array was squeezed down to remove all of the air present.  The array was then place into the endometrial cavity and deployed to a length of 6.5 cm. The handpiece confirmed appropriate width by being in the green portion of the visual dial. The cervical cuff was then inflated to the point the CO2 indicator was in the green. The endometrial integrity check was then performed and integrity sequence was confirmed x 2. The heating was then begun and carried out for a total of 2 minutes(which is standard therapy time). When the plasma cycle was finished,  the cervical cuff was deflated and the array was removed with tissue present on the silicon membrane. There was appropriate post Minerva bleeding and uterine discharge.     All of the  equipment worked well throughout the procedure.  The patient was awakened from anesthesia and taken to the recovery room in good stable condition all counts were correct. She received 2 g of Ancef and 30 mg of Toradol preoperatively. She will be discharged from the recovery room and followed up in the office in 1- 2 weeks.   She can expect 4 weeks of post procedure bloody watery discharge  Florian Buff, MD  10/26/2020 1:31 PM   Preoperative Diagnosis:  1.  Multiparous female desires permanent sterilization                                          2.  Elects to have bilateral salpingectomy for ovarian cancer prophylaxis  Postoperative Diagnosis:  Same as above  Procedure:  Laparoscopic Bilateral Salpingectomy for the purpose of permanent sterilization  Surgeon:  Jacelyn Grip MD  Anaesthesia: general  Findings:  Patient had normal pelvic anatomy and no intraperitoneal abnormalities.  Description of Operation:  Patient was taken to the OR and placed into supine position where she underwent general anaesthesia.   She was placed in the dorsal lithotomy position and prepped and draped in the usual sterile fashion.   An incision was made in the umbilicus and dissection taken down to the rectus fascia. A Veres needle  was used to insufflate the periotneal cavity. An 11 mm non bladed video laparoscope trocar was then placed under direct visualization without difficulty.   The above noted findings were observed.   Two additional 5 mm non bladed trocars were placed in the right and left lower quadrants under direct visualization without difficulty.   The Harmonic scalpel was employed and a salpingectomy of both the right and left tubes was performed.   The tubes were removed from the peritoneal cavity and sent to pathology.   There was good hemostasis bilaterally.   The fascia, peritoneum and subcutaneous tissue were closed using 0 vicryl.   All 3 skin incisions were closed using 3-0  vicryl in a subcuticular fashion.  Exparel 266 mg 20 cc was injected in the 3 incisional/trocar sites.  The patient was awakened from anaesthesia and taken to the PACU with all counts being correct x 3.   The patient received  2 gram of ancef andToradol 30 mg IV preoperatively.  Mertie Clause Elisama Thissen 10/26/2020 1:32 PM

## 2020-10-26 NOTE — Anesthesia Procedure Notes (Signed)
Procedure Name: Intubation Date/Time: 10/26/2020 11:58 AM Performed by: Karna Dupes, CRNA Pre-anesthesia Checklist: Patient identified, Emergency Drugs available, Suction available and Patient being monitored Patient Re-evaluated:Patient Re-evaluated prior to induction Oxygen Delivery Method: Circle system utilized Preoxygenation: Pre-oxygenation with 100% oxygen Induction Type: IV induction Ventilation: Mask ventilation without difficulty Laryngoscope Size: Mac and 3 Grade View: Grade I Tube type: Oral Tube size: 7.0 mm Number of attempts: 1 Airway Equipment and Method: Stylet and Oral airway Placement Confirmation: ETT inserted through vocal cords under direct vision,  positive ETCO2 and breath sounds checked- equal and bilateral Secured at: 21 cm Tube secured with: Tape Dental Injury: Teeth and Oropharynx as per pre-operative assessment

## 2020-10-26 NOTE — Anesthesia Postprocedure Evaluation (Signed)
Anesthesia Post Note  Patient: Marcia Hahn  Procedure(s) Performed: DILATATION AND CURETTAGE/HYSTEROSCOPY WITH MINERVA ENDOMETRIAL ABLATION (N/A ) LAPAROSCOPIC BILATERAL SALPINGECTOMY (Bilateral )  Patient location during evaluation: PACU Anesthesia Type: General Level of consciousness: awake and alert and patient cooperative Pain management: satisfactory to patient Vital Signs Assessment: post-procedure vital signs reviewed and stable Respiratory status: spontaneous breathing Cardiovascular status: stable Postop Assessment: no apparent nausea or vomiting Anesthetic complications: no   No complications documented.   Last Vitals:  Vitals:   10/26/20 1345 10/26/20 1400  BP: 128/73 (!) 138/92  Pulse: 89 85  Resp: 13 15  Temp:    SpO2: 99% 100%    Last Pain:  Vitals:   10/26/20 1400  TempSrc:   PainSc: 0-No pain                 Ahilyn Nell

## 2020-10-26 NOTE — Anesthesia Preprocedure Evaluation (Signed)
Anesthesia Evaluation  Patient identified by MRN, date of birth, ID band Patient awake    Reviewed: Allergy & Precautions, H&P , NPO status , Patient's Chart, lab work & pertinent test results, reviewed documented beta blocker date and time   Airway Mallampati: II  TM Distance: >3 FB Neck ROM: full    Dental no notable dental hx. (+) Teeth Intact   Pulmonary neg pulmonary ROS, former smoker,    Pulmonary exam normal breath sounds clear to auscultation       Cardiovascular Exercise Tolerance: Good hypertension, negative cardio ROS   Rhythm:regular Rate:Normal     Neuro/Psych negative neurological ROS  negative psych ROS   GI/Hepatic negative GI ROS, Neg liver ROS,   Endo/Other  Morbid obesity  Renal/GU negative Renal ROS  negative genitourinary   Musculoskeletal   Abdominal   Peds  Hematology  (+) Blood dyscrasia, anemia ,   Anesthesia Other Findings   Reproductive/Obstetrics negative OB ROS                             Anesthesia Physical Anesthesia Plan  ASA: III  Anesthesia Plan: General   Post-op Pain Management:    Induction:   PONV Risk Score and Plan: Ondansetron  Airway Management Planned:   Additional Equipment:   Intra-op Plan:   Post-operative Plan:   Informed Consent: I have reviewed the patients History and Physical, chart, labs and discussed the procedure including the risks, benefits and alternatives for the proposed anesthesia with the patient or authorized representative who has indicated his/her understanding and acceptance.     Dental Advisory Given  Plan Discussed with: CRNA  Anesthesia Plan Comments:         Anesthesia Quick Evaluation

## 2020-10-27 ENCOUNTER — Encounter (HOSPITAL_COMMUNITY): Payer: Self-pay | Admitting: Obstetrics & Gynecology

## 2020-10-27 DIAGNOSIS — L218 Other seborrheic dermatitis: Secondary | ICD-10-CM | POA: Diagnosis not present

## 2020-10-27 DIAGNOSIS — L708 Other acne: Secondary | ICD-10-CM | POA: Diagnosis not present

## 2020-10-27 DIAGNOSIS — L700 Acne vulgaris: Secondary | ICD-10-CM | POA: Diagnosis not present

## 2020-10-27 DIAGNOSIS — L818 Other specified disorders of pigmentation: Secondary | ICD-10-CM | POA: Diagnosis not present

## 2020-10-28 LAB — SURGICAL PATHOLOGY

## 2020-10-31 ENCOUNTER — Encounter: Payer: Self-pay | Admitting: Podiatry

## 2020-10-31 ENCOUNTER — Other Ambulatory Visit: Payer: Self-pay

## 2020-10-31 ENCOUNTER — Ambulatory Visit (HOSPITAL_COMMUNITY)
Admission: RE | Admit: 2020-10-31 | Discharge: 2020-10-31 | Disposition: A | Discharge status: Home / Self Care | Payer: BC Managed Care – PPO | Source: Ambulatory Visit | Attending: Family Medicine | Admitting: Family Medicine

## 2020-10-31 ENCOUNTER — Ambulatory Visit (INDEPENDENT_AMBULATORY_CARE_PROVIDER_SITE_OTHER): Payer: BC Managed Care – PPO | Admitting: Podiatry

## 2020-10-31 DIAGNOSIS — M1990 Unspecified osteoarthritis, unspecified site: Secondary | ICD-10-CM

## 2020-10-31 DIAGNOSIS — B351 Tinea unguium: Secondary | ICD-10-CM

## 2020-10-31 DIAGNOSIS — L600 Ingrowing nail: Secondary | ICD-10-CM

## 2020-10-31 DIAGNOSIS — M2010 Hallux valgus (acquired), unspecified foot: Secondary | ICD-10-CM | POA: Insufficient documentation

## 2020-10-31 DIAGNOSIS — Z1231 Encounter for screening mammogram for malignant neoplasm of breast: Secondary | ICD-10-CM | POA: Insufficient documentation

## 2020-10-31 DIAGNOSIS — M79675 Pain in left toe(s): Secondary | ICD-10-CM | POA: Diagnosis not present

## 2020-10-31 DIAGNOSIS — M76829 Posterior tibial tendinitis, unspecified leg: Secondary | ICD-10-CM

## 2020-10-31 DIAGNOSIS — M7660 Achilles tendinitis, unspecified leg: Secondary | ICD-10-CM | POA: Insufficient documentation

## 2020-10-31 HISTORY — DX: Unspecified osteoarthritis, unspecified site: M19.90

## 2020-10-31 HISTORY — DX: Posterior tibial tendinitis, unspecified leg: M76.829

## 2020-10-31 NOTE — Patient Instructions (Signed)

## 2020-11-02 ENCOUNTER — Encounter: Payer: Self-pay | Admitting: Podiatry

## 2020-11-02 DIAGNOSIS — B351 Tinea unguium: Secondary | ICD-10-CM | POA: Diagnosis not present

## 2020-11-03 NOTE — Progress Notes (Signed)
Subjective:   Patient ID: Marcia Hahn, female   DOB: 44 y.o.   MRN: [id number]   HPI 44 year old female presents the office today for concerns of ingrown toenail left big toe, medial aspect. She states that she does get pedicures.. Is been sore and tender but denies any pus or any red streaks. She said no recent treatment otherwise and she has no other concerns today.   Review of Systems  All other systems reviewed and are negative.  Past Medical History:  Diagnosis Date  . Abnormal mammogram of right breast 09/03/2018  . Acne vulgaris   . Hypertension   . Mild obesity     Past Surgical History:  Procedure Laterality Date  . BREAST BIOPSY Left   . CESAREAN SECTION    . DILATATION AND CURETTAGE/HYSTEROSCOPY WITH MINERVA N/A 10/26/2020   Procedure: DILATATION AND CURETTAGE/HYSTEROSCOPY WITH MINERVA ENDOMETRIAL ABLATION;  Surgeon: Florian Buff, MD;  Location: [facility];  Service: Gynecology;  Laterality: N/A;  . LAPAROSCOPIC BILATERAL SALPINGECTOMY Bilateral 10/26/2020   Procedure: LAPAROSCOPIC BILATERAL SALPINGECTOMY;  Surgeon: Florian Buff, MD;  Location: [facility];  Service: Gynecology;  Laterality: Bilateral;     Current Outpatient Medications:  .  clobetasol (TEMOVATE) 0.05 % external solution, Apply topically 2 (two) times daily as needed., Disp: , Rfl:  .  dasatinib (SPRYCEL) 70 MG tablet, Take 1 tablet (70 mg total) by mouth daily., Disp: 30 tablet, Rfl: 3 .  HYDROcodone-acetaminophen (NORCO/VICODIN) 5-325 MG tablet, Take 1 tablet by mouth every 6 (six) hours as needed., Disp: 15 tablet, Rfl: 0 .  ketorolac (TORADOL) 10 MG tablet, Take 1 tablet (10 mg total) by mouth every 8 (eight) hours as needed., Disp: 15 tablet, Rfl: 0 .  losartan (COZAAR) 50 MG tablet, Take 1 tablet (50 mg total) by mouth daily., Disp: 90 tablet, Rfl: 3 .  Multiple Vitamins-Minerals (MULTIVITAMIN WITH MINERALS) tablet, Take 1 tablet by mouth daily., Disp: , Rfl:  .  ondansetron (ZOFRAN ODT) 8 MG  disintegrating tablet, Take 1 tablet (8 mg total) by mouth every 8 (eight) hours as needed for nausea or vomiting., Disp: 8 tablet, Rfl: 0 .  spironolactone (ALDACTONE) 25 MG tablet, Take 0.5 tablets (12.5 mg total) by mouth daily., Disp: 45 tablet, Rfl: 3  Allergies  Allergen Reactions  . Amlodipine     Headache   . Tomato     Processed tomatoes in the can.           Objective:  Physical Exam  General: AAO x3, NAD  Dermatological: Incurvation present left hallux, medial aspect of the toenail with localized edema and erythema but there is no drainage or pus or any signs of infection erythema slightly more from inflammation as opposed to infection. There is no open lesions. The nail is hypertrophic, dystrophic with yellow-brown discoloration.  Vascular: Dorsalis Pedis artery and Posterior Tibial artery pedal pulses are 2/4 bilateral with immedate capillary fill time. There is no pain with calf compression, swelling, warmth, erythema.   Neruologic: Grossly intact via light touch bilateral.   Musculoskeletal: No gross boney pedal deformities bilateral. No pain, crepitus, or limitation noted with foot and ankle range of motion bilateral. Muscular strength 5/5 in all groups tested bilateral.  Gait: Unassisted, Nonantalgic.       Assessment:   44 year old female right medial hallux ingrown toe    Plan:  -Treatment options discussed including all alternatives, risks, and complications -Etiology of symptoms were discussed -At this time, the patient is requesting partial  nail removal with chemical matricectomy to the symptomatic portion of the nail. Risks and complications were discussed with the patient for which they understand and written consent was obtained. Under sterile conditions a total of 3 mL of a mixture of 2% lidocaine plain and 0.5% Marcaine plain was infiltrated in a hallux block fashion. Once anesthetized, the skin was prepped in sterile fashion. A tourniquet was then  applied. Next the medial aspect of hallux nail border was then sharply excised making sure to remove the entire offending nail border. Once the nails were ensured to be removed area was debrided and the underlying skin was intact. There is no purulence identified in the procedure. Next phenol was then applied under standard conditions and copiously irrigated. Silvadene was applied. A dry sterile dressing was applied. After application of the dressing the tourniquet was removed and there is found to be an immediate capillary refill time to the digit. The patient tolerated the procedure well any complications. Post procedure instructions were discussed the patient for which he verbally understood. Follow-up in one week for nail check or sooner if any problems are to arise. Discussed signs/symptoms of infection and directed to call the office immediately should any occur or go directly to the emergency room. In the meantime, encouraged to call the office with any questions, concerns, changes symptoms. -Nail sent for culture to [facility] labs.   Trula Slade DPM

## 2020-11-08 ENCOUNTER — Telehealth (INDEPENDENT_AMBULATORY_CARE_PROVIDER_SITE_OTHER): Payer: BC Managed Care – PPO | Admitting: Obstetrics & Gynecology

## 2020-11-08 ENCOUNTER — Encounter: Payer: Self-pay | Admitting: Obstetrics & Gynecology

## 2020-11-08 VITALS — Ht 66.0 in | Wt 213.6 lb

## 2020-11-08 DIAGNOSIS — Z9889 Other specified postprocedural states: Secondary | ICD-10-CM

## 2020-11-08 NOTE — Progress Notes (Signed)
Video visit HPI: Patient returns for routine postoperative follow-up having undergone hysteroscopy endometrial ablation and laparoscopic bilateral salpingectomy on 10/26/20.  The patient's immediate postoperative recovery has been unremarkable. Since hospital discharge the patient reports no problems.   Current Outpatient Medications: dasatinib (SPRYCEL) 70 MG tablet, Take 1 tablet (70 mg total) by mouth daily., Disp: 30 tablet, Rfl: 3 losartan (COZAAR) 50 MG tablet, Take 1 tablet (50 mg total) by mouth daily., Disp: 90 tablet, Rfl: 3 Multiple Vitamins-Minerals (MULTIVITAMIN WITH MINERALS) tablet, Take 1 tablet by mouth daily., Disp: , Rfl:  spironolactone (ALDACTONE) 25 MG tablet, Take 0.5 tablets (12.5 mg total) by mouth daily., Disp: 45 tablet, Rfl: 3  No current facility-administered medications for this visit.    Height 5\' 6"  (1.676 m), weight 213 lb 9.6 oz (96.9 kg).  Physical Exam: By video abdominal exam is normal  Diagnostic Tests:   Pathology: benign  Impression:   ICD-10-CM   1. Post-operative state, S/P endometrial ablation and laparoscopic bilateral salpingectomy 10/26/20  Z98.890       Plan:     Follow up: No follow-ups on file.   10/28/20, MD

## 2020-11-13 ENCOUNTER — Encounter: Payer: Self-pay | Admitting: Obstetrics & Gynecology

## 2020-11-14 ENCOUNTER — Encounter: Payer: Self-pay | Admitting: Podiatry

## 2020-11-14 ENCOUNTER — Other Ambulatory Visit: Payer: Self-pay

## 2020-11-14 ENCOUNTER — Ambulatory Visit (INDEPENDENT_AMBULATORY_CARE_PROVIDER_SITE_OTHER): Payer: BC Managed Care – PPO | Admitting: Podiatry

## 2020-11-14 DIAGNOSIS — L600 Ingrowing nail: Secondary | ICD-10-CM

## 2020-11-14 DIAGNOSIS — B351 Tinea unguium: Secondary | ICD-10-CM

## 2020-11-14 NOTE — Patient Instructions (Signed)

## 2020-11-21 ENCOUNTER — Other Ambulatory Visit: Payer: Self-pay

## 2020-11-21 ENCOUNTER — Inpatient Hospital Stay (HOSPITAL_COMMUNITY): Payer: BC Managed Care – PPO | Attending: Hematology

## 2020-11-21 ENCOUNTER — Encounter (HOSPITAL_COMMUNITY): Payer: Self-pay

## 2020-11-21 DIAGNOSIS — I10 Essential (primary) hypertension: Secondary | ICD-10-CM | POA: Diagnosis not present

## 2020-11-21 DIAGNOSIS — E883 Tumor lysis syndrome: Secondary | ICD-10-CM | POA: Insufficient documentation

## 2020-11-21 DIAGNOSIS — Z79899 Other long term (current) drug therapy: Secondary | ICD-10-CM | POA: Insufficient documentation

## 2020-11-21 DIAGNOSIS — C9210 Chronic myeloid leukemia, BCR/ABL-positive, not having achieved remission: Secondary | ICD-10-CM | POA: Diagnosis not present

## 2020-11-21 DIAGNOSIS — D649 Anemia, unspecified: Secondary | ICD-10-CM | POA: Diagnosis not present

## 2020-11-21 LAB — CBC WITH DIFFERENTIAL/PLATELET
Abs Immature Granulocytes: 0.02 10*3/uL (ref 0.00–0.07)
Basophils Absolute: 0 10*3/uL (ref 0.0–0.1)
Basophils Relative: 1 %
Eosinophils Absolute: 0.2 10*3/uL (ref 0.0–0.5)
Eosinophils Relative: 3 %
HCT: 37.4 % (ref 36.0–46.0)
Hemoglobin: 11.8 g/dL — ABNORMAL LOW (ref 12.0–15.0)
Immature Granulocytes: 0 %
Lymphocytes Relative: 31 %
Lymphs Abs: 2 10*3/uL (ref 0.7–4.0)
MCH: 31 pg (ref 26.0–34.0)
MCHC: 31.6 g/dL (ref 30.0–36.0)
MCV: 98.2 fL (ref 80.0–100.0)
Monocytes Absolute: 0.4 10*3/uL (ref 0.1–1.0)
Monocytes Relative: 6 %
Neutro Abs: 3.9 10*3/uL (ref 1.7–7.7)
Neutrophils Relative %: 59 %
Platelets: 290 10*3/uL (ref 150–400)
RBC: 3.81 MIL/uL — ABNORMAL LOW (ref 3.87–5.11)
RDW: 13 % (ref 11.5–15.5)
WBC: 6.5 10*3/uL (ref 4.0–10.5)
nRBC: 0 % (ref 0.0–0.2)

## 2020-11-21 LAB — LACTATE DEHYDROGENASE: LDH: 187 U/L (ref 98–192)

## 2020-11-21 LAB — COMPREHENSIVE METABOLIC PANEL
ALT: 21 U/L (ref 0–44)
AST: 19 U/L (ref 15–41)
Albumin: 4.1 g/dL (ref 3.5–5.0)
Alkaline Phosphatase: 43 U/L (ref 38–126)
Anion gap: 9 (ref 5–15)
BUN: 12 mg/dL (ref 6–20)
CO2: 23 mmol/L (ref 22–32)
Calcium: 8.9 mg/dL (ref 8.9–10.3)
Chloride: 105 mmol/L (ref 98–111)
Creatinine, Ser: 0.99 mg/dL (ref 0.44–1.00)
GFR, Estimated: >60 mL/min (ref >60)
Glucose, Bld: 80 mg/dL (ref 70–99)
Potassium: 3.5 mmol/L (ref 3.5–5.1)
Sodium: 137 mmol/L (ref 135–145)
Total Bilirubin: 0.2 mg/dL — ABNORMAL LOW (ref 0.3–1.2)
Total Protein: 7 g/dL (ref 6.5–8.1)

## 2020-11-21 NOTE — Progress Notes (Signed)
Subjective: Marcia Hahn is a 45 y.o.  female returns to office today for follow up evaluation after having left Hallux medial partial nail avulsion performed. Patient has been soaking using epsom salts and applying topical antibiotic covered with bandaid daily.  States that she has been feeling well denies any drainage or pus or any pain.  Patient denies fevers, chills, nausea, vomiting. Denies any calf pain, chest pain, SOB.   Objective:  General: Well developed, nourished, in no acute distress, alert and oriented x3   Dermatology: Skin is warm, dry and supple bilateral. LEFT hallux nail border appears to be clean, dry, with surrounding scab. There is no surrounding erythema, edema, drainage/purulence. The remaining nails appear unremarkable at this time. There are no other lesions or other signs of infection present.  Neurovascular status: Intact. No lower extremity swelling; No pain with calf compression bilateral.  Musculoskeletal: No tenderness to palpation of the left hallux nail fold. Muscular strength within normal limits bilateral.   Assesement and Plan: S/p partial nail avulsion, doing well.   -Continue soaking in epsom salts twice a day followed by antibiotic ointment and a band-aid. Can leave uncovered at night. Continue this until completely healed.  -If the area has not healed in 2 weeks, call the office for follow-up appointment, or sooner if any problems arise.  -Monitor for any signs/symptoms of infection. Call the office immediately if any occur or go directly to the emergency room. Call with any questions/concerns. -Awaiting culture  Celesta Gentile, DPM

## 2020-11-28 ENCOUNTER — Ambulatory Visit (HOSPITAL_COMMUNITY): Payer: BC Managed Care – PPO | Admitting: Hematology

## 2020-11-28 LAB — BCR-ABL1, CML/ALL, PCR, QUANT: Interpretation (BCRAL):: NEGATIVE

## 2020-11-29 ENCOUNTER — Other Ambulatory Visit (HOSPITAL_COMMUNITY): Payer: Self-pay | Admitting: Hematology

## 2020-11-29 DIAGNOSIS — C9210 Chronic myeloid leukemia, BCR/ABL-positive, not having achieved remission: Secondary | ICD-10-CM

## 2020-11-30 ENCOUNTER — Encounter: Payer: Self-pay | Admitting: Podiatry

## 2020-12-06 DIAGNOSIS — Z20822 Contact with and (suspected) exposure to covid-19: Secondary | ICD-10-CM | POA: Diagnosis not present

## 2020-12-13 ENCOUNTER — Other Ambulatory Visit: Payer: Self-pay

## 2020-12-13 ENCOUNTER — Inpatient Hospital Stay (HOSPITAL_COMMUNITY): Payer: BC Managed Care – PPO | Attending: Hematology | Admitting: Hematology

## 2020-12-13 VITALS — BP 140/80 | HR 75 | Temp 97.0°F | Resp 18 | Wt 213.6 lb

## 2020-12-13 DIAGNOSIS — R531 Weakness: Secondary | ICD-10-CM | POA: Diagnosis not present

## 2020-12-13 DIAGNOSIS — C9110 Chronic lymphocytic leukemia of B-cell type not having achieved remission: Secondary | ICD-10-CM | POA: Insufficient documentation

## 2020-12-13 DIAGNOSIS — Z87891 Personal history of nicotine dependence: Secondary | ICD-10-CM | POA: Diagnosis not present

## 2020-12-13 DIAGNOSIS — Z803 Family history of malignant neoplasm of breast: Secondary | ICD-10-CM | POA: Diagnosis not present

## 2020-12-13 DIAGNOSIS — I10 Essential (primary) hypertension: Secondary | ICD-10-CM | POA: Insufficient documentation

## 2020-12-13 DIAGNOSIS — Z79899 Other long term (current) drug therapy: Secondary | ICD-10-CM | POA: Diagnosis not present

## 2020-12-13 DIAGNOSIS — R5383 Other fatigue: Secondary | ICD-10-CM | POA: Diagnosis not present

## 2020-12-13 DIAGNOSIS — D649 Anemia, unspecified: Secondary | ICD-10-CM | POA: Diagnosis not present

## 2020-12-13 DIAGNOSIS — E669 Obesity, unspecified: Secondary | ICD-10-CM | POA: Insufficient documentation

## 2020-12-13 DIAGNOSIS — C9210 Chronic myeloid leukemia, BCR/ABL-positive, not having achieved remission: Secondary | ICD-10-CM | POA: Diagnosis not present

## 2020-12-13 NOTE — Patient Instructions (Signed)
[place] at [facility] Discharge Instructions  You were seen today by Dr. Delton Coombes. He went over your recent results. Continue taking Sprycel 70 mg daily. Dr. Delton Coombes will see you back in 3 months for labs and follow up.   Thank you for choosing [place] at [facility] to provide your oncology and hematology care.  To afford each patient quality time with our provider, please arrive at least 15 minutes before your scheduled appointment time.   If you have a lab appointment with the [facility] please come in thru the Main Entrance and check in at the main information desk  You need to re-schedule your appointment should you arrive 10 or more minutes late.  We strive to give you quality time with our providers, and arriving late affects you and other patients whose appointments are after yours.  Also, if you no show three or more times for appointments you may be dismissed from the clinic at the providers discretion.     Again, thank you for choosing [facility].  Our hope is that these requests will decrease the amount of time that you wait before being seen by our physicians.       _____________________________________________________________  Should you have questions after your visit to [facility], please contact our office at [phone] between the hours of 8:00 a.m. and 4:30 p.m.  Voicemails left after 4:00 p.m. will not be returned until the following business day.  For prescription refill requests, have your pharmacy contact our office and allow 72 hours.    Cancer Center Support Programs:   > Cancer Support Group  2nd Tuesday of the month 1pm-2pm, Journey Room

## 2020-12-13 NOTE — Progress Notes (Signed)
[place], [place] [address]   CLINIC:  Medical Oncology/Hematology  PCP:  Fayrene Helper, MD [address] [place] / [place] [address]  [phone]  REASON FOR VISIT:  Follow-up for CML  PRIOR THERAPY: None  CURRENT THERAPY: Dasatinib 70 mg daily  INTERVAL HISTORY:  Marcia Hahn, a 45 y.o. female, returns for routine follow-up for her CML. Marcia Hahn was last seen on 09/21/2020.  Today she reports feeling well. She is tolerating dasatinib 70 mg well, though she reports noticing that she is becoming more forgetful, such as random chores that need to be done; she denies having family history of dementia. She is no longer taking iron tablets since having her D&C with curettage.   REVIEW OF SYSTEMS:  Review of Systems  Constitutional: Positive for fatigue (75%). Negative for appetite change.  Musculoskeletal: Positive for myalgias (4/10 generalized pain).  Neurological: Positive for dizziness.       Memory loss  All other systems reviewed and are negative.   PAST MEDICAL/SURGICAL HISTORY:  Past Medical History:  Diagnosis Date  . Abnormal mammogram of right breast 09/03/2018  . Acne vulgaris   . Hypertension   . Mild obesity    Past Surgical History:  Procedure Laterality Date  . BREAST BIOPSY Left   . CESAREAN SECTION    . DILATATION AND CURETTAGE/HYSTEROSCOPY WITH MINERVA N/A 10/26/2020   Procedure: DILATATION AND CURETTAGE/HYSTEROSCOPY WITH MINERVA ENDOMETRIAL ABLATION;  Surgeon: Florian Buff, MD;  Location: [facility];  Service: Gynecology;  Laterality: N/A;  . LAPAROSCOPIC BILATERAL SALPINGECTOMY Bilateral 10/26/2020   Procedure: LAPAROSCOPIC BILATERAL SALPINGECTOMY;  Surgeon: Florian Buff, MD;  Location: [facility];  Service: Gynecology;  Laterality: Bilateral;    SOCIAL HISTORY:  Social History   Socioeconomic History  . Marital status: Married    Spouse name: Not on file  . Number of children: 2  . Years  of education: Not on file  . Highest education level: Not on file  Occupational History  . Occupation: Psychologist, educational   Tobacco Use  . Smoking status: Former Smoker    Types: Cigarettes    Quit date: 12/04/2012    Years since quitting: 8.0  . Smokeless tobacco: Never Used  Vaping Use  . Vaping Use: Never used  Substance and Sexual Activity  . Alcohol use: Yes    Comment: occasional  . Drug use: No  . Sexual activity: Not Currently    Birth control/protection: Surgical    Comment: tubal and ablation  Other Topics Concern  . Not on file  Social History Narrative   Mother of twins    Social Determinants of Health   Financial Resource Strain: Low Risk   . Difficulty of Paying Living Expenses: Not hard at all  Food Insecurity: No Food Insecurity  . Worried About Charity fundraiser in the Last Year: Never true  . Ran Out of Food in the Last Year: Never true  Transportation Needs: No Transportation Needs  . Lack of Transportation (Medical): No  . Lack of Transportation (Non-Medical): No  Physical Activity: Inactive  . Days of Exercise per Week: 0 days  . Minutes of Exercise per Session: 0 min  Stress: No Stress Concern Present  . Feeling of Stress : Not at all  Social Connections: Moderately Integrated  . Frequency of Communication with Friends and Family: More than three times a week  . Frequency of Social Gatherings with Friends and Family:  Once a week  . Attends Religious Services: 1 to 4 times per year  . Active Member of Clubs or Organizations: No  . Attends Archivist Meetings: Never  . Marital Status: Married  Human resources officer Violence: Not At Risk  . Fear of Current or Ex-Partner: No  . Emotionally Abused: No  . Physically Abused: No  . Sexually Abused: No    FAMILY HISTORY:  Family History  Problem Relation Age of Onset  . GER disease Mother   . Diabetes Mother   . Hypertension Mother   . Heart attack Father 61  . Heart disease Father   .  Migraines Sister   . Breast cancer Maternal Grandmother   . Stroke Maternal Grandmother   . Breast cancer Cousin   . Stroke Maternal Aunt     CURRENT MEDICATIONS:  Current Outpatient Medications  Medication Sig Dispense Refill  . losartan (COZAAR) 50 MG tablet Take 1 tablet (50 mg total) by mouth daily. 90 tablet 3  . Multiple Vitamins-Minerals (MULTIVITAMIN WITH MINERALS) tablet Take 1 tablet by mouth daily.    Marland Kitchen spironolactone (ALDACTONE) 25 MG tablet Take 0.5 tablets (12.5 mg total) by mouth daily. 45 tablet 3  . SPRYCEL 70 MG tablet TAKE 1 TABLET (70 MG TOTAL) BY MOUTH DAILY. 30 tablet 0   No current facility-administered medications for this visit.    ALLERGIES:  Allergies  Allergen Reactions  . Amlodipine     Headache   . Tomato     Processed tomatoes in the can.     PHYSICAL EXAM:  Performance status (ECOG): 0 - Asymptomatic  Vitals:   12/13/20 1047  BP: 140/80  Pulse: 75  Resp: 18  Temp: (!) 97 F (36.1 C)  SpO2: 98%   Wt Readings from Last 3 Encounters:  12/13/20 213 lb 9.6 oz (96.9 kg)  11/08/20 213 lb 9.6 oz (96.9 kg)  10/25/20 215 lb (97.5 kg)   Physical Exam Vitals reviewed.  Constitutional:      Appearance: Normal appearance. She is obese.  Cardiovascular:     Rate and Rhythm: Normal rate and regular rhythm.     Pulses: Normal pulses.     Heart sounds: Normal heart sounds.  Pulmonary:     Effort: Pulmonary effort is normal.     Breath sounds: Normal breath sounds.  Neurological:     General: No focal deficit present.     Mental Status: She is alert and oriented to person, place, and time.  Psychiatric:        Mood and Affect: Mood normal.        Behavior: Behavior normal.     LABORATORY DATA:  I have reviewed the labs as listed.  CBC Latest Ref Rng & Units 11/21/2020 10/25/2020 09/15/2020  WBC 4.0 - 10.5 K/uL 6.5 6.6 6.8  Hemoglobin 12.0 - 15.0 g/dL 11.8(L) 11.4(L) 11.1(L)  Hematocrit 36.0 - 46.0 % 37.4 35.0(L) 34.7(L)  Platelets 150 -  400 K/uL 290 243 270   CMP Latest Ref Rng & Units 11/21/2020 10/25/2020 09/15/2020  Glucose 70 - 99 mg/dL 80 96 90  BUN 6 - 20 mg/dL _0 Creatinine 0.44 - 1.00 mg/dL 0.99 0.97 0.91  Sodium 135 - 145 mmol/L 137 136 137  Potassium 3.5 - 5.1 mmol/L 3.5 3.6 3.7  Chloride 98 - 111 mmol/L 105 104 105  CO2 22 - 32 mmol/L _1 Calcium 8.9 - 10.3 mg/dL 8.9 8.4(L) 8.5(L)  Total Protein 6.5 -  8.1 g/dL 7.0 6.7 6.8  Total Bilirubin 0.3 - 1.2 mg/dL 0.2(L) 0.4 0.5  Alkaline Phos 38 - 126 U/L 43 39 41  AST 15 - 41 U/L _0 ALT 0 - 44 U/L _1 Component Value Date/Time   RBC 3.81 (L) 11/21/2020 1430   MCV 98.2 11/21/2020 1430   MCH 31.0 11/21/2020 1430   MCHC 31.6 11/21/2020 1430   RDW 13.0 11/21/2020 1430   LYMPHSABS 2.0 11/21/2020 1430   MONOABS 0.4 11/21/2020 1430   EOSABS 0.2 11/21/2020 1430   BASOSABS 0.0 11/21/2020 1430   Lab Results  Component Value Date   LDH 187 11/21/2020   LDH 145 06/30/2020   LDH 147 06/01/2020    DIAGNOSTIC IMAGING:  I have independently reviewed the scans and discussed with the patient. No results found.   ASSESSMENT:  1. CML in chronic phase: -Presentation with hyperleukocytosis. BCR/ABL by FISH positive. -CT chest PE protocol showed splenomegaly measuring 14 cm. -BMBX on 02/23/2020 shows hypercellular marrow with myeloid hyperplasia consistent with CML. No increase in blasts. Flow cytometry less than 1% blasts. Chromosome analysis shows Philadelphia chromosome. -Dasatinib 70 mg daily started on 04/01/2020. -BCR/ABL by quantitative PCR on 06/30/2020 shows B3 A2 transcript improved to 2.19 from 128 previously.B2 A2 andE1A2 transcripts are undetectable. -BCR/ABL by quantitative PCR on 11/21/2020 -.   PLAN:  1. CML in chronic phase: -She is tolerating dasatinib 70 mg daily very well. -Physical examination did not reveal any palpable splenomegaly. -Reviewed results of BCR/ABL by quantitative PCR from 11/21/2020, negative.   She achieved this within 7 months of starting dasatinib.  This is really great news. -Reviewed CBC which is within normal limits.  LFTs are also normal. -Recommend follow-up in 3 months with repeat labs including PCR test. -She reported slight memory problems.  This is not a common side effect of dasatinib but was reported in case reports.  We will keep a close eye on it.  2. Hypertension: -Continue losartan daily.  Continue spironolactone 12.5 mg daily.  3. Normocytic anemia: -She stopped taking iron tablet since she had curettaged.  Hemoglobin is stable at 11.8.  Orders placed this encounter:  Orders Placed This Encounter  Procedures  . CBC with Differential/Platelet  . Comprehensive metabolic panel  . Lactate dehydrogenase  . BCR-ABL1, CML/ALL, PCR, QUANT     Derek Jack, MD [place] [phone]   I, Milinda Antis, am acting as a scribe for Dr. Sanda Linger.  I, Derek Jack MD, have reviewed the above documentation for accuracy and completeness, and I agree with the above.

## 2020-12-15 DIAGNOSIS — J019 Acute sinusitis, unspecified: Secondary | ICD-10-CM | POA: Diagnosis not present

## 2020-12-15 DIAGNOSIS — H9202 Otalgia, left ear: Secondary | ICD-10-CM | POA: Diagnosis not present

## 2020-12-15 DIAGNOSIS — H6122 Impacted cerumen, left ear: Secondary | ICD-10-CM | POA: Diagnosis not present

## 2020-12-20 ENCOUNTER — Telehealth: Payer: Self-pay

## 2020-12-20 NOTE — Telephone Encounter (Signed)
Patient called today and said she has called in the past needing records from 1/58-7/27 for [employer].  I called her back and gave her the HIM address, fax and email and let her know that request goes to [facility] HIM department.

## 2020-12-29 ENCOUNTER — Other Ambulatory Visit (HOSPITAL_COMMUNITY): Payer: Self-pay | Admitting: Hematology

## 2020-12-29 DIAGNOSIS — C9210 Chronic myeloid leukemia, BCR/ABL-positive, not having achieved remission: Secondary | ICD-10-CM

## 2021-01-02 ENCOUNTER — Other Ambulatory Visit: Payer: Self-pay

## 2021-01-02 ENCOUNTER — Telehealth (INDEPENDENT_AMBULATORY_CARE_PROVIDER_SITE_OTHER): Payer: BC Managed Care – PPO | Admitting: Family Medicine

## 2021-01-02 ENCOUNTER — Encounter: Payer: Self-pay | Admitting: Family Medicine

## 2021-01-02 VITALS — Ht 66.0 in | Wt 208.0 lb

## 2021-01-02 DIAGNOSIS — I10 Essential (primary) hypertension: Secondary | ICD-10-CM

## 2021-01-02 DIAGNOSIS — Z1211 Encounter for screening for malignant neoplasm of colon: Secondary | ICD-10-CM

## 2021-01-02 DIAGNOSIS — E559 Vitamin D deficiency, unspecified: Secondary | ICD-10-CM

## 2021-01-02 DIAGNOSIS — J3089 Other allergic rhinitis: Secondary | ICD-10-CM

## 2021-01-02 DIAGNOSIS — E785 Hyperlipidemia, unspecified: Secondary | ICD-10-CM

## 2021-01-02 DIAGNOSIS — E669 Obesity, unspecified: Secondary | ICD-10-CM

## 2021-01-02 NOTE — Progress Notes (Signed)
Virtual Visit via Telephone Note  I connected with Delyla L Gaeta on 01/02/21 at  3:00 PM EST by telephone and verified that I am speaking with the correct person using two identifiers.  Location: Patient: work Provider: office   I discussed the limitations, risks, security and privacy concerns of performing an evaluation and management service by telephone and the availability of in person appointments. I also discussed with the patient that there may be a patient responsible charge related to this service. The patient expressed understanding and agreed to proceed.   History of Present Illness: F/U chronic problems and address any new or current concerns. Review and update medications and allergies. Review recent lab and radiologic data . Update routine health maintainace. Review an encourage improved health habits to include nutrition, exercise and  sleep .  Denies recent fever or chills. 2 week h/o sinus drainage at times yellow Denies chest congestion, productive cough or wheezing. Denies chest pains, palpitations and leg swelling Denies abdominal pain, nausea, vomiting,diarrhea or constipation.   Denies dysuria, frequency, hesitancy or incontinence. Denies joint pain, swelling and limitation in mobility. Denies headaches, seizures, numbness, or tingling. Denies depression, anxiety or insomnia. Denies skin break down or rash.       Observations/Objective: Ht 5\' 6"  (1.676 m)   Wt 208 lb (94.3 kg)   BMI 33.57 kg/m  Good communication with no confusion and intact memory. Alert and oriented x 3 No signs of respiratory distress during speech    Assessment and Plan: Environmental and seasonal allergies Currently having flare of symptoms, OTC zyrtec, claritin or allegra advise for as needed use  Benign essential HTN DASH diet and commitment to daily physical activity for a minimum of 30 minutes discussed and encouraged, as a part of hypertension management. The  importance of attaining a healthy weight is also discussed.  BP/Weight 01/02/2021 12/13/2020 11/08/2020 10/26/2020 10/25/2020 09/27/2020 89/[birth year]/1941  Systolic BP - 740 - 814 481 856 314  Diastolic BP - 80 - 95 87 89 80  Wt. (Lbs) 208 213.6 213.6 - 215 - 214.04  BMI 33.57 34.48 34.48 - 34.7 35.62 36.74       Obesity (BMI 30.0-34.9)  Patient re-educated about  the importance of commitment to a  minimum of 150 minutes of exercise per week as able.  The importance of healthy food choices with portion control discussed, as well as eating regularly and within a 12 hour window most days. The need to choose "clean , green" food 50 to 75% of the time is discussed, as well as to make water the primary drink and set a goal of 64 ounces water daily.    Weight /BMI 01/02/2021 12/13/2020 11/08/2020  WEIGHT 208 lb 213 lb 9.6 oz 213 lb 9.6 oz  HEIGHT 5\' 6"  - 5\' 6"   BMI 33.57 kg/m2 34.48 kg/m2 34.48 kg/m2      Dyslipidemia (high LDL; low HDL) Hyperlipidemia:Low fat diet discussed and encouraged.   Lipid Panel  Lab Results  Component Value Date   CHOL 147 06/30/2020   HDL 39 (L) 06/30/2020   LDLCALC 90 06/30/2020   TRIG 90 06/30/2020   CHOLHDL 3.8 06/30/2020  Updated lab needed at/ before next visit.        Follow Up Instructions:    I discussed the assessment and treatment plan with the patient. The patient was provided an opportunity to ask questions and all were answered. The patient agreed with the plan and demonstrated an understanding of the instructions.  The patient was advised to call back or seek an in-person evaluation if the symptoms worsen or if the condition fails to improve as anticipated.  I provided 18 minutes of non-face-to-face time during this encounter.   Tula Nakayama, MD

## 2021-01-02 NOTE — Patient Instructions (Addendum)
F/U end August , call if you need me sooner  Please get fasting lipid, TSH and vit D 1 week before appointment  You are referred for colonoscopy for colon cancer screening, this will not be before your 19 th birthday, the GI office will reach out to you  For allergy symptoms you may use OTC zyrtec, claritin or allegra one daily as needed, until symptoms improve  Congrats on weight loss through dietary change, keep it up!  Start daily exercise for 30 minutes total, you may split it up, aim for 5 days per week   Calorie Counting for Weight Loss Calories are units of energy. Your body needs a certain number of calories from food to keep going throughout the day. When you eat or drink more calories than your body needs, your body stores the extra calories mostly as fat. When you eat or drink fewer calories than your body needs, your body burns fat to get the energy it needs. Calorie counting means keeping track of how many calories you eat and drink each day. Calorie counting can be helpful if you need to lose weight. If you eat fewer calories than your body needs, you should lose weight. Ask your health care provider what a healthy weight is for you. For calorie counting to work, you will need to eat the right number of calories each day to lose a healthy amount of weight per week. A dietitian can help you figure out how many calories you need in a day and will suggest ways to reach your calorie goal.  A healthy amount of weight to lose each week is usually 1-2 lb (0.5-0.9 kg). This usually means that your daily calorie intake should be reduced by 500-750 calories.  Eating 1,200-1,500 calories a day can help most women lose weight.  Eating 1,500-1,800 calories a day can help most men lose weight. What do I need to know about calorie counting? Work with your health care provider or dietitian to determine how many calories you should get each day. To meet your daily calorie goal, you will need  to:  Find out how many calories are in each food that you would like to eat. Try to do this before you eat.  Decide how much of the food you plan to eat.  Keep a food log. Do this by writing down what you ate and how many calories it had. To successfully lose weight, it is important to balance calorie counting with a healthy lifestyle that includes regular activity. Where do I find calorie information? The number of calories in a food can be found on a Nutrition Facts label. If a food does not have a Nutrition Facts label, try to look up the calories online or ask your dietitian for help. Remember that calories are listed per serving. If you choose to have more than one serving of a food, you will have to multiply the calories per serving by the number of servings you plan to eat. For example, the label on a package of bread might say that a serving size is 1 slice and that there are 90 calories in a serving. If you eat 1 slice, you will have eaten 90 calories. If you eat 2 slices, you will have eaten 180 calories.   How do I keep a food log? After each time that you eat, record the following in your food log as soon as possible:  What you ate. Be sure to include toppings, sauces, and  other extras on the food.  How much you ate. This can be measured in cups, ounces, or number of items.  How many calories were in each food and drink.  The total number of calories in the food you ate. Keep your food log near you, such as in a pocket-sized notebook or on an app or website on your mobile phone. Some programs will calculate calories for you and show you how many calories you have left to meet your daily goal. What are some portion-control tips?  Know how many calories are in a serving. This will help you know how many servings you can have of a certain food.  Use a measuring cup to measure serving sizes. You could also try weighing out portions on a kitchen scale. With time, you will be able to  estimate serving sizes for some foods.  Take time to put servings of different foods on your favorite plates or in your favorite bowls and cups so you know what a serving looks like.  Try not to eat straight from a food's packaging, such as from a bag or box. Eating straight from the package makes it hard to see how much you are eating and can lead to overeating. Put the amount you would like to eat in a cup or on a plate to make sure you are eating the right portion.  Use smaller plates, glasses, and bowls for smaller portions and to prevent overeating.  Try not to multitask. For example, avoid watching TV or using your computer while eating. If it is time to eat, sit down at a table and enjoy your food. This will help you recognize when you are full. It will also help you be more mindful of what and how much you are eating. What are tips for following this plan? Reading food labels  Check the calorie count compared with the serving size. The serving size may be smaller than what you are used to eating.  Check the source of the calories. Try to choose foods that are high in protein, fiber, and vitamins, and low in saturated fat, trans fat, and sodium. Shopping  Read nutrition labels while you shop. This will help you make healthy decisions about which foods to buy.  Pay attention to nutrition labels for low-fat or fat-free foods. These foods sometimes have the same number of calories or more calories than the full-fat versions. They also often have added sugar, starch, or salt to make up for flavor that was removed with the fat.  Make a grocery list of lower-calorie foods and stick to it. Cooking  Try to cook your favorite foods in a healthier way. For example, try baking instead of frying.  Use low-fat dairy products. Meal planning  Use more fruits and vegetables. One-half of your plate should be fruits and vegetables.  Include lean proteins, such as chicken, [place], and  fish. Lifestyle Each week, aim to do one of the following:  150 minutes of moderate exercise, such as walking.  75 minutes of vigorous exercise, such as running. General information  Know how many calories are in the foods you eat most often. This will help you calculate calorie counts faster.  Find a way of tracking calories that works for you. Get creative. Try different apps or programs if writing down calories does not work for you. What foods should I eat?  Eat nutritious foods. It is better to have a nutritious, high-calorie food, such as an avocado, than  a food with few nutrients, such as a bag of potato chips.  Use your calories on foods and drinks that will fill you up and will not leave you hungry soon after eating. ? Examples of foods that fill you up are nuts and nut butters, vegetables, lean proteins, and high-fiber foods such as whole grains. High-fiber foods are foods with more than 5 g of fiber per serving.  Pay attention to calories in drinks. Low-calorie drinks include water and unsweetened drinks. The items listed above may not be a complete list of foods and beverages you can eat. Contact a dietitian for more information.   What foods should I limit? Limit foods or drinks that are not good sources of vitamins, minerals, or protein or that are high in unhealthy fats. These include:  Candy.  Other sweets.  Sodas, specialty coffee drinks, alcohol, and juice. The items listed above may not be a complete list of foods and beverages you should avoid. Contact a dietitian for more information. How do I count calories when eating out?  Pay attention to portions. Often, portions are much larger when eating out. Try these tips to keep portions smaller: ? Consider sharing a meal instead of getting your own. ? If you get your own meal, eat only half of it. Before you start eating, ask for a container and put half of your meal into it. ? When available, consider ordering  smaller portions from the menu instead of full portions.  Pay attention to your food and drink choices. Knowing the way food is cooked and what is included with the meal can help you eat fewer calories. ? If calories are listed on the menu, choose the lower-calorie options. ? Choose dishes that include vegetables, fruits, whole grains, low-fat dairy products, and lean proteins. ? Choose items that are boiled, broiled, grilled, or steamed. Avoid items that are buttered, battered, fried, or served with cream sauce. Items labeled as crispy are usually fried, unless stated otherwise. ? Choose water, low-fat milk, unsweetened iced tea, or other drinks without added sugar. If you want an alcoholic beverage, choose a lower-calorie option, such as a glass of wine or light beer. ? Ask for dressings, sauces, and syrups on the side. These are usually high in calories, so you should limit the amount you eat. ? If you want a salad, choose a garden salad and ask for grilled meats. Avoid extra toppings such as bacon, cheese, or fried items. Ask for the dressing on the side, or ask for olive oil and vinegar or lemon to use as dressing.  Estimate how many servings of a food you are given. Knowing serving sizes will help you be aware of how much food you are eating at restaurants. Where to find more information  Centers for Disease Control and Prevention: http://www.wolf.info/  [place]. Department of Agriculture: http://www.wilson-mendoza.org/ Summary  Calorie counting means keeping track of how many calories you eat and drink each day. If you eat fewer calories than your body needs, you should lose weight.  A healthy amount of weight to lose per week is usually 1-2 lb (0.5-0.9 kg). This usually means reducing your daily calorie intake by 500-750 calories.  The number of calories in a food can be found on a Nutrition Facts label. If a food does not have a Nutrition Facts label, try to look up the calories online or ask your dietitian for  help.  Use smaller plates, glasses, and bowls for smaller portions and to prevent  overeating.  Use your calories on foods and drinks that will fill you up and not leave you hungry shortly after a meal. This information is not intended to replace advice given to you by your health care provider. Make sure you discuss any questions you have with your health care provider. Document Revised: 12/10/2019 Document Reviewed: 12/10/2019 Elsevier Patient Education  2021 [employer].

## 2021-01-03 ENCOUNTER — Encounter: Payer: Self-pay | Admitting: Internal Medicine

## 2021-01-05 ENCOUNTER — Encounter: Payer: Self-pay | Admitting: Family Medicine

## 2021-01-05 NOTE — Assessment & Plan Note (Signed)
DASH diet and commitment to daily physical activity for a minimum of 30 minutes discussed and encouraged, as a part of hypertension management. The importance of attaining a healthy weight is also discussed.  BP/Weight 01/02/2021 12/13/2020 11/08/2020 10/26/2020 10/25/2020 09/27/2020 44/31/5400  Systolic BP - 867 - 619 [phone]  Diastolic BP - 80 - 95 87 89 80  Wt. (Lbs) 208 213.6 213.6 - 215 - 214.04  BMI 33.57 34.48 34.48 - 34.7 35.62 36.74

## 2021-01-05 NOTE — Assessment & Plan Note (Signed)
  Patient re-educated about  the importance of commitment to a  minimum of 150 minutes of exercise per week as able.  The importance of healthy food choices with portion control discussed, as well as eating regularly and within a 12 hour window most days. The need to choose "clean , green" food 50 to 75% of the time is discussed, as well as to make water the primary drink and set a goal of 64 ounces water daily.    Weight /BMI 01/02/2021 12/13/2020 11/08/2020  WEIGHT 208 lb 213 lb 9.6 oz 213 lb 9.6 oz  HEIGHT 5\' 6"  - 5\' 6"   BMI 33.57 kg/m2 34.48 kg/m2 34.48 kg/m2

## 2021-01-05 NOTE — Assessment & Plan Note (Signed)
Hyperlipidemia:Low fat diet discussed and encouraged.   Lipid Panel  Lab Results  Component Value Date   CHOL 147 06/30/2020   HDL 39 (L) 06/30/2020   LDLCALC 90 06/30/2020   TRIG 90 06/30/2020   CHOLHDL 3.8 06/30/2020     Updated lab needed at/ before next visit.  

## 2021-01-05 NOTE — Assessment & Plan Note (Signed)
Currently having flare of symptoms, OTC zyrtec, claritin or allegra advise for as needed use

## 2021-01-24 ENCOUNTER — Ambulatory Visit (INDEPENDENT_AMBULATORY_CARE_PROVIDER_SITE_OTHER): Payer: Self-pay | Admitting: *Deleted

## 2021-01-24 ENCOUNTER — Other Ambulatory Visit: Payer: Self-pay

## 2021-01-24 VITALS — Ht 66.0 in | Wt 212.8 lb

## 2021-01-24 DIAGNOSIS — Z1211 Encounter for screening for malignant neoplasm of colon: Secondary | ICD-10-CM

## 2021-01-24 MED ORDER — NA SULFATE-K SULFATE-MG SULF 17.5-3.13-1.6 GM/177ML PO SOLN: 1.0000 | Freq: Once | ORAL | 0 refills | Status: AC

## 2021-01-24 NOTE — Patient Instructions (Addendum)
Marcia Hahn  [birth year] MRN: [id number]    Remember Covid test: 03/15/2021 at 3:45 PM.   Procedure Date:  03/17/2021 Time to register: You will receive a call from the hospital a few days before your procedure. Place to register: [facility] Scheduled provider: Dr. Abbey Chatters    PREPARATION FOR COLONOSCOPY WITH SUPREP BOWEL PREP KIT  Note: Suprep Bowel Prep Kit is a split-dose (2day) regimen. Consumption of BOTH 6-ounce bottles is required for a complete prep.  Please notify us immediately if you are diabetic, take iron supplements, or if you are on Coumadin or any other blood thinners.  Please hold the following medications: n/a                                                                                                                                                  2 DAYS BEFORE PROCEDURE:  DATE: 03/15/2021   DAY: Wednesday Begin clear liquid diet AFTER your lunch meal. NO SOLID FOODS after this point.  1 DAY BEFORE PROCEDURE:  DATE: 03/16/2021   DAY:  Thursday Continue clear liquids the entire day - NO SOLID FOOD.   Diabetic medications adjustments for today: n/a  At 8:00am: Complete steps 1 through 4 below, using ONE (1) 6-ounce bottle, before going to bed. Step 1:  Pour ONE (1) 6-ounce bottle of SUPREP liquid into the mixing container.  Step 2:  Add cool drinking water to the 16 ounce line on the container and mix.  Note: Dilute the solution concentrate as directed prior to use. Step 3:  DRINK ALL the liquid in the container. Step 4:  You MUST drink an additional two (2) or more 16 ounce containers of water over the next one (1) hour.   At 6:00pm: Complete steps 1 through 4 below, using ONE (1) 6-ounce bottle, before going to bed. Step 1:  Pour ONE (1) 6-ounce bottle of SUPREP liquid into the mixing container.  Step 2:  Add cool drinking water to the 16 ounce line on the container and mix.  Note: Dilute the solution concentrate as directed prior to use. Step 3:   DRINK ALL the liquid in the container. Step 4:  You MUST drink an additional two (2) or more 16 ounce containers of water over the next one (1) hour.   Continue clear liquids until midnight.  Nothing by mouth after midnight.  DAY OF PROCEDURE:   DATE: 03/17/2021  DAY:  Friday If you take medications for your heart, blood pressure, or breathing, you may take these medications.  Diabetic medications adjustments for today: n/a  You may take your morning medications with sip of water unless we have instructed otherwise.    Please see below for Dietary Information.  CLEAR LIQUIDS INCLUDE:  Water Jello (NOT red in color)   Ice Popsicles (NOT red in color)  Tea (sugar ok, no milk/cream) Powdered fruit flavored drinks  Coffee (sugar ok, no milk/cream) Gatorade/ Lemonade/ Kool-Aid  (NOT red in color)   Juice: apple, white grape, white cranberry Soft drinks  Clear bullion, consomme, broth (fat free beef/chicken/vegetable)  Carbonated beverages (any kind)  Strained chicken noodle soup Hard Candy   Remember: Clear liquids are liquids that will allow you to see your fingers on the other side of a clear glass. Be sure liquids are NOT red in color, and not cloudy, but CLEAR.  DO NOT EAT OR DRINK ANY OF THE FOLLOWING:  Dairy products of any kind   Cranberry juice Tomato juice / V8 juice   Grapefruit juice Orange juice     Red grape juice  Do not eat any solid foods, including such foods as: cereal, oatmeal, yogurt, fruits, vegetables, creamed soups, eggs, bread, crackers, pureed foods in a blender, etc.   HELPFUL HINTS FOR DRINKING PREP SOLUTION:   Make sure prep is extremely cold. Mix and refrigerate the the morning of the prep. You may also put in the freezer.   You may try mixing some Crystal Light or Country Time Lemonade if you prefer. Mix in small amounts; add more if necessary.  Try drinking through a straw  Rinse mouth with water or a mouthwash between glasses, to remove  after-taste.  Try sipping on a cold beverage /ice/ popsicles between glasses of prep.  Place a piece of sugar-free hard candy in mouth between glasses.  If you become nauseated, try consuming smaller amounts, or stretch out the time between glasses. Stop for 30-60 minutes, then slowly start back drinking.     OTHER INSTRUCTIONS  You will need a responsible adult at least 45 years of age to accompany you and drive you home. This person must remain in the waiting room during your procedure. The hospital will cancel your procedure if you do not have a responsible adult with you.   1. Wear loose fitting clothing that is easily removed. 2. Leave jewelry and other valuables at home.  3. Remove all body piercing jewelry and leave at home. 4. Total time from sign-in until discharge is approximately 2-3 hours. 5. You should go home directly after your procedure and rest. You can resume normal activities the day after your procedure. 6. The day of your procedure you should not:  Drive  Make legal decisions  Operate machinery  Drink alcohol  Return to work   You may call the office (Dept: [phone]) before 5:00pm, or page the doctor on call [phone]) after 5:00pm, for further instructions, if necessary.   Insurance Information YOU WILL NEED TO CHECK WITH YOUR INSURANCE COMPANY FOR THE BENEFITS OF COVERAGE YOU HAVE FOR THIS PROCEDURE.  UNFORTUNATELY, NOT ALL INSURANCE COMPANIES HAVE BENEFITS TO COVER ALL OR PART OF THESE TYPES OF PROCEDURES.  IT IS YOUR RESPONSIBILITY TO CHECK YOUR BENEFITS, HOWEVER, WE WILL BE GLAD TO ASSIST YOU WITH ANY CODES YOUR INSURANCE COMPANY MAY NEED.    PLEASE NOTE THAT MOST INSURANCE COMPANIES WILL NOT COVER A SCREENING COLONOSCOPY FOR PEOPLE UNDER THE AGE OF 50  IF YOU HAVE BCBS INSURANCE, YOU MAY HAVE BENEFITS FOR A SCREENING COLONOSCOPY BUT IF POLYPS ARE FOUND THE DIAGNOSIS WILL CHANGE AND THEN YOU MAY HAVE A DEDUCTIBLE THAT WILL NEED TO BE MET. SO  PLEASE MAKE SURE YOU CHECK YOUR BENEFITS FOR A SCREENING COLONOSCOPY AS WELL AS A DIAGNOSTIC COLONOSCOPY.

## 2021-01-24 NOTE — Progress Notes (Addendum)
Gastroenterology Pre-Procedure Review  Request Date: 01/24/2021 Requesting Physician: Dr. Moshe Cipro, no previous TCS  PATIENT REVIEW QUESTIONS: The patient responded to the following health history questions as indicated:    1. Diabetes Melitis: no 2. Joint replacements in the past 12 months: no 3. Major health problems in the past 3 months: no 4. Has an artificial valve or MVP: no 5. Has a defibrillator: no 6. Has been advised in past to take antibiotics in advance of a procedure like teeth cleaning: no 7. Family history of colon cancer: no  8. Alcohol Use: no 9. Illicit drug Use: no 10. History of sleep apnea: no  11. History of coronary artery or other vascular stents placed within the last 12 months: no 12. History of any prior anesthesia complications: no 13. Body mass index is 34.35 kg/m.    MEDICATIONS & ALLERGIES:    Patient reports the following regarding taking any blood thinners:   Plavix? no Aspirin? no Coumadin? no Brilinta? no Xarelto? no Eliquis? no Pradaxa? no Savaysa? no Effient? no  Patient confirms/reports the following medications:  Current Outpatient Medications  Medication Sig Dispense Refill   losartan (COZAAR) 50 MG tablet Take 1 tablet (50 mg total) by mouth daily. 90 tablet 3   Multiple Vitamins-Minerals (MULTIVITAMIN WITH MINERALS) tablet Take 1 tablet by mouth daily.     spironolactone (ALDACTONE) 25 MG tablet Take 0.5 tablets (12.5 mg total) by mouth daily. 45 tablet 3   SPRYCEL 70 MG tablet TAKE 1 TABLET (70 MG TOTAL) BY MOUTH DAILY. 30 tablet 6   No current facility-administered medications for this visit.    Patient confirms/reports the following allergies:  Allergies  Allergen Reactions   Amlodipine     Headache    Tomato     Processed tomatoes in the can.     No orders of the defined types were placed in this encounter.   AUTHORIZATION INFORMATION Primary Insurance: [place],  [place] #:[id number] ,  Group #: [id number] Pre-Cert /  Josem Kaufmann required: No, not required per Saralyn Pilar Ref#: [id number]  SCHEDULE INFORMATION: Procedure has been scheduled as follows:  Date: 03/17/2021 , Time: AM procedure Location: [facility] with Dr. Abbey Chatters  This Gastroenterology Pre-Precedure Review Form is being routed to the following provider(s): Roseanne Kaufman, NP

## 2021-01-27 NOTE — Progress Notes (Signed)
Appropriate. Make sure scheduled after her birthday (so it is age 45) and insurance has no issues. ASA 2.

## 2021-01-31 NOTE — Progress Notes (Signed)
Spoke to [employer] at [place].  No pre-cert required.  No age criteria.  Ref#: [id number]

## 2021-02-07 ENCOUNTER — Telehealth: Payer: Self-pay | Admitting: *Deleted

## 2021-02-07 NOTE — Telephone Encounter (Signed)
I sent over the prescription to [facility]. Lattie Haw

## 2021-02-07 NOTE — Telephone Encounter (Signed)
Called and left message for the patient that the nail culture came back positive and per Dr Jacqualyn Posey is sending over the prescription to [facility] and stated to call the office if any concerns or questions. Marcia Hahn

## 2021-02-27 ENCOUNTER — Telehealth: Payer: Self-pay

## 2021-02-27 NOTE — Telephone Encounter (Signed)
Patient need med refill  spironolactone (ALDACTONE) 25 MG tablet   Marcia Hahn

## 2021-02-28 ENCOUNTER — Other Ambulatory Visit: Payer: Self-pay

## 2021-02-28 MED ORDER — SPIRONOLACTONE 25 MG PO TABS: 12.5000 mg | ORAL_TABLET | Freq: Every day | ORAL | 3 refills | Status: DC

## 2021-03-15 ENCOUNTER — Other Ambulatory Visit: Payer: Self-pay

## 2021-03-15 ENCOUNTER — Other Ambulatory Visit (HOSPITAL_COMMUNITY)
Admission: RE | Admit: 2021-03-15 | Discharge: 2021-03-15 | Disposition: A | Discharge status: Home / Self Care | Payer: BC Managed Care – PPO | Source: Ambulatory Visit | Attending: Internal Medicine | Admitting: Internal Medicine

## 2021-03-15 DIAGNOSIS — E669 Obesity, unspecified: Secondary | ICD-10-CM | POA: Diagnosis not present

## 2021-03-15 DIAGNOSIS — K635 Polyp of colon: Secondary | ICD-10-CM | POA: Diagnosis not present

## 2021-03-15 DIAGNOSIS — Z87891 Personal history of nicotine dependence: Secondary | ICD-10-CM | POA: Diagnosis not present

## 2021-03-15 DIAGNOSIS — Z20822 Contact with and (suspected) exposure to covid-19: Secondary | ICD-10-CM | POA: Insufficient documentation

## 2021-03-15 DIAGNOSIS — K648 Other hemorrhoids: Secondary | ICD-10-CM | POA: Diagnosis not present

## 2021-03-15 DIAGNOSIS — K644 Residual hemorrhoidal skin tags: Secondary | ICD-10-CM | POA: Diagnosis not present

## 2021-03-15 DIAGNOSIS — Z888 Allergy status to other drugs, medicaments and biological substances status: Secondary | ICD-10-CM | POA: Diagnosis not present

## 2021-03-15 DIAGNOSIS — Z1211 Encounter for screening for malignant neoplasm of colon: Secondary | ICD-10-CM | POA: Diagnosis not present

## 2021-03-15 DIAGNOSIS — Z79899 Other long term (current) drug therapy: Secondary | ICD-10-CM | POA: Diagnosis not present

## 2021-03-15 DIAGNOSIS — Z01812 Encounter for preprocedural laboratory examination: Secondary | ICD-10-CM | POA: Insufficient documentation

## 2021-03-15 DIAGNOSIS — Z6833 Body mass index (BMI) 33.0-33.9, adult: Secondary | ICD-10-CM | POA: Diagnosis not present

## 2021-03-15 LAB — BASIC METABOLIC PANEL
Anion gap: 6 (ref 5–15)
BUN: 19 mg/dL (ref 6–20)
CO2: 25 mmol/L (ref 22–32)
Calcium: 8.3 mg/dL — ABNORMAL LOW (ref 8.9–10.3)
Chloride: 105 mmol/L (ref 98–111)
Creatinine, Ser: 1.01 mg/dL — ABNORMAL HIGH (ref 0.44–1.00)
GFR, Estimated: >60 mL/min (ref >60)
Glucose, Bld: 88 mg/dL (ref 70–99)
Potassium: 3.5 mmol/L (ref 3.5–5.1)
Sodium: 136 mmol/L (ref 135–145)

## 2021-03-15 LAB — PREGNANCY, URINE: Preg Test, Ur: NEGATIVE

## 2021-03-16 LAB — SARS CORONAVIRUS 2 (TAT 6-24 HRS): SARS Coronavirus 2: NEGATIVE

## 2021-03-17 ENCOUNTER — Encounter (HOSPITAL_COMMUNITY): Payer: Self-pay

## 2021-03-17 ENCOUNTER — Ambulatory Visit (HOSPITAL_COMMUNITY): Payer: BC Managed Care – PPO | Admitting: Anesthesiology

## 2021-03-17 ENCOUNTER — Other Ambulatory Visit: Payer: Self-pay

## 2021-03-17 ENCOUNTER — Encounter (HOSPITAL_COMMUNITY)
Admission: RE | Disposition: A | Discharge status: Home / Self Care | Payer: Self-pay | Source: Home / Self Care | Attending: Internal Medicine

## 2021-03-17 ENCOUNTER — Ambulatory Visit (HOSPITAL_COMMUNITY)
Admission: RE | Admit: 2021-03-17 | Discharge: 2021-03-17 | Disposition: A | Discharge status: Home / Self Care | Payer: BC Managed Care – PPO | Attending: Internal Medicine | Admitting: Internal Medicine

## 2021-03-17 DIAGNOSIS — Z1211 Encounter for screening for malignant neoplasm of colon: Secondary | ICD-10-CM

## 2021-03-17 DIAGNOSIS — K644 Residual hemorrhoidal skin tags: Secondary | ICD-10-CM | POA: Diagnosis not present

## 2021-03-17 DIAGNOSIS — Z87891 Personal history of nicotine dependence: Secondary | ICD-10-CM | POA: Insufficient documentation

## 2021-03-17 DIAGNOSIS — Z888 Allergy status to other drugs, medicaments and biological substances status: Secondary | ICD-10-CM | POA: Diagnosis not present

## 2021-03-17 DIAGNOSIS — Z20822 Contact with and (suspected) exposure to covid-19: Secondary | ICD-10-CM | POA: Diagnosis not present

## 2021-03-17 DIAGNOSIS — Z79899 Other long term (current) drug therapy: Secondary | ICD-10-CM | POA: Diagnosis not present

## 2021-03-17 DIAGNOSIS — Z6833 Body mass index (BMI) 33.0-33.9, adult: Secondary | ICD-10-CM | POA: Diagnosis not present

## 2021-03-17 DIAGNOSIS — K635 Polyp of colon: Secondary | ICD-10-CM

## 2021-03-17 DIAGNOSIS — E669 Obesity, unspecified: Secondary | ICD-10-CM | POA: Diagnosis not present

## 2021-03-17 DIAGNOSIS — D649 Anemia, unspecified: Secondary | ICD-10-CM | POA: Diagnosis not present

## 2021-03-17 DIAGNOSIS — K648 Other hemorrhoids: Secondary | ICD-10-CM | POA: Insufficient documentation

## 2021-03-17 HISTORY — PX: POLYPECTOMY: SHX5525

## 2021-03-17 HISTORY — DX: Chronic myeloid leukemia, BCR/ABL-positive, not having achieved remission: C92.10

## 2021-03-17 HISTORY — PX: COLONOSCOPY WITH PROPOFOL: SHX5780

## 2021-03-17 SURGERY — COLONOSCOPY WITH PROPOFOL
Anesthesia: General

## 2021-03-17 MED ORDER — CHLORHEXIDINE GLUCONATE CLOTH 2 % EX PADS: 6.0000 | MEDICATED_PAD | Freq: Once | CUTANEOUS | Status: DC

## 2021-03-17 MED ORDER — LACTATED RINGERS IV SOLN: INTRAVENOUS | Status: DC

## 2021-03-17 MED ORDER — LIDOCAINE HCL (CARDIAC) PF 100 MG/5ML IV SOSY
PREFILLED_SYRINGE | INTRAVENOUS | Status: DC | PRN
  Administered 2021-03-17: 50 mg via INTRAVENOUS

## 2021-03-17 MED ORDER — PROPOFOL 10 MG/ML IV BOLUS
INTRAVENOUS | Status: DC | PRN
  Administered 2021-03-17 (×3): 50 mg via INTRAVENOUS
  Administered 2021-03-17: 100 mg via INTRAVENOUS

## 2021-03-17 NOTE — Anesthesia Postprocedure Evaluation (Signed)
Anesthesia Post Note  Patient: Marcia Hahn  Procedure(s) Performed: COLONOSCOPY WITH PROPOFOL (N/A ) POLYPECTOMY  Patient location during evaluation: Endoscopy Anesthesia Type: General Level of consciousness: awake and alert and oriented Pain management: pain level controlled Vital Signs Assessment: post-procedure vital signs reviewed and stable Respiratory status: spontaneous breathing and respiratory function stable Cardiovascular status: blood pressure returned to baseline and stable Postop Assessment: no apparent nausea or vomiting Anesthetic complications: no   No complications documented.   Last Vitals:  Vitals:   03/17/21 0653 03/17/21 0801  BP: (!) 153/91 (!) 116/91  Pulse: 66   Resp: 15 20  Temp: 37.1 C 37.1 C  SpO2: 99% 99%    Last Pain:  Vitals:   03/17/21 0801  TempSrc: Oral  PainSc: 0-No pain                 Primus Gritton C Nikko Goldwire

## 2021-03-17 NOTE — Anesthesia Preprocedure Evaluation (Addendum)
Anesthesia Evaluation  Patient identified by MRN, date of birth, ID band Patient awake    Reviewed: Allergy & Precautions, NPO status , Patient's Chart, lab work & pertinent test results  Airway Mallampati: II  TM Distance: >3 FB Neck ROM: Full    Dental  (+) Dental Advisory Given, Teeth Intact   Pulmonary neg pulmonary ROS, former smoker,    Pulmonary exam normal breath sounds clear to auscultation       Cardiovascular Exercise Tolerance: Good hypertension, Pt. on medications Normal cardiovascular exam Rhythm:Regular Rate:Normal     Neuro/Psych Papilledema  negative psych ROS   GI/Hepatic negative GI ROS, Neg liver ROS,   Endo/Other  negative endocrine ROS  Renal/GU negative Renal ROS     Musculoskeletal  (+) Arthritis ,   Abdominal   Peds  Hematology  (+) Blood dyscrasia (CML), anemia ,   Anesthesia Other Findings   Reproductive/Obstetrics                            Anesthesia Physical Anesthesia Plan  ASA: II  Anesthesia Plan: General   Post-op Pain Management:    Induction: Intravenous  PONV Risk Score and Plan: Propofol infusion  Airway Management Planned: Nasal Cannula and Natural Airway  Additional Equipment:   Intra-op Plan:   Post-operative Plan:   Informed Consent: I have reviewed the patients History and Physical, chart, labs and discussed the procedure including the risks, benefits and alternatives for the proposed anesthesia with the patient or authorized representative who has indicated his/her understanding and acceptance.     Dental advisory given  Plan Discussed with: CRNA and Surgeon  Anesthesia Plan Comments:         Anesthesia Quick Evaluation

## 2021-03-17 NOTE — Anesthesia Procedure Notes (Signed)
Date/Time: 03/17/2021 7:46 AM Performed by: Orlie Dakin, CRNA Pre-anesthesia Checklist: Patient identified, Emergency Drugs available, Suction available and Patient being monitored Patient Re-evaluated:Patient Re-evaluated prior to induction Oxygen Delivery Method: Nasal cannula Induction Type: IV induction Placement Confirmation: positive ETCO2

## 2021-03-17 NOTE — Discharge Instructions (Addendum)
Colonoscopy Discharge Instructions  Read the instructions outlined below and refer to this sheet in the next few weeks. These discharge instructions provide you with general information on caring for yourself after you leave the hospital. Your doctor may also give you specific instructions. While your treatment has been planned according to the most current medical practices available, unavoidable complications occasionally occur.   ACTIVITY  You may resume your regular activity, but move at a slower pace for the next 24 hours.   Take frequent rest periods for the next 24 hours.   Walking will help get rid of the air and reduce the bloated feeling in your belly (abdomen).   No driving for 24 hours (because of the medicine (anesthesia) used during the test).    Do not sign any important legal documents or operate any machinery for 24 hours (because of the anesthesia used during the test).  NUTRITION  Drink plenty of fluids.   You may resume your normal diet as instructed by your doctor.   Begin with a light meal and progress to your normal diet. Heavy or fried foods are harder to digest and may make you feel sick to your stomach (nauseated).   Avoid alcoholic beverages for 24 hours or as instructed.  MEDICATIONS  You may resume your normal medications unless your doctor tells you otherwise.  WHAT YOU CAN EXPECT TODAY  Some feelings of bloating in the abdomen.   Passage of more gas than usual.   Spotting of blood in your stool or on the toilet paper.  IF YOU HAD POLYPS REMOVED DURING THE COLONOSCOPY:  No aspirin products for 7 days or as instructed.   No alcohol for 7 days or as instructed.   Eat a soft diet for the next 24 hours.  FINDING OUT THE RESULTS OF YOUR TEST Not all test results are available during your visit. If your test results are not back during the visit, make an appointment with your caregiver to find out the results. Do not assume everything is normal if  you have not heard from your caregiver or the medical facility. It is important for you to follow up on all of your test results.  SEEK IMMEDIATE MEDICAL ATTENTION IF:  You have more than a spotting of blood in your stool.   Your belly is swollen (abdominal distention).   You are nauseated or vomiting.   You have a temperature over 101.   You have abdominal pain or discomfort that is severe or gets worse throughout the day.    Unfortunately, your colon was not adequately prepped for me to do a complete examination.  I did find 1 polyp which I removed successfully.  However given the stool burden in the colon, I cannot rule out other polyps throughout the colon.  As such, I would recommend we repeat in 6 months or so with a different colon preparation.  We will have you come back to the clinic in 3 months to discuss strategies to improve preparation next time.  I hope you have a great rest of your week!  Elon Alas. Abbey Chatters, D.O. Gastroenterology and Hepatology [facility]   Colon Polyps  Colon polyps are tissue growths inside the colon, which is part of the large intestine. They are one of the types of polyps that can grow in the body. A polyp may be a round bump or a mushroom-shaped growth. You could have one polyp or more than one. Most colon polyps are noncancerous (  benign). However, some colon polyps can become cancerous over time. Finding and removing the polyps early can help prevent this. What are the causes? The exact cause of colon polyps is not known. What increases the risk? The following factors may make you more likely to develop this condition:  Having a family history of colorectal cancer or colon polyps.  Being older than 45 years of age.  Being younger than 45 years of age and having a significant family history of colorectal cancer or colon polyps or a genetic condition that puts you at higher risk of getting colon polyps.  Having  inflammatory bowel disease, such as ulcerative colitis or Crohn's disease.  Having certain conditions passed from parent to child (hereditary conditions), such as: ? Familial adenomatous polyposis (FAP). ? Lynch syndrome. ? Turcot syndrome. ? Peutz-Jeghers syndrome. ? MUTYH-associated polyposis (MAP).  Being overweight.  Certain lifestyle factors. These include smoking cigarettes, drinking too much alcohol, not getting enough exercise, and eating a diet that is high in fat and red meat and low in fiber.  Having had childhood cancer that was treated with radiation of the abdomen. What are the signs or symptoms? Many times, there are no symptoms. If you have symptoms, they may include:  Blood coming from the rectum during a bowel movement.  Blood in the stool (feces). The blood may be bright red or very dark in color.  Pain in the abdomen.  A change in bowel habits, such as constipation or diarrhea. How is this diagnosed? This condition is diagnosed with a colonoscopy. This is a procedure in which a lighted, flexible scope is inserted into the opening between the buttocks (anus) and then passed into the colon to examine the area. Polyps are sometimes found when a colonoscopy is done as part of routine cancer screening tests. How is this treated? This condition is treated by removing any polyps that are found. Most polyps can be removed during a colonoscopy. Those polyps will then be tested for cancer. Additional treatment may be needed depending on the results of testing. Follow these instructions at home: Eating and drinking  Eat foods that are high in fiber, such as fruits, vegetables, and whole grains.  Eat foods that are high in calcium and vitamin D, such as milk, cheese, yogurt, eggs, liver, fish, and broccoli.  Limit foods that are high in fat, such as fried foods and desserts.  Limit the amount of red meat, precooked or cured meat, or other processed meat that you eat,  such as hot dogs, sausages, bacon, or meat loaves.  Limit sugary drinks.   Lifestyle  Maintain a healthy weight, or lose weight if recommended by your health care provider.  Exercise every day or as told by your health care provider.  Do not use any products that contain nicotine or tobacco, such as cigarettes, e-cigarettes, and chewing tobacco. If you need help quitting, ask your health care provider.  Do not drink alcohol if: ? Your health care provider tells you not to drink. ? You are pregnant, may be pregnant, or are planning to become pregnant.  If you drink alcohol: ? Limit how much you use to:  0-1 drink a day for women.  0-2 drinks a day for men. ? Know how much alcohol is in your drink. In the [place] one drink equals one 12 oz bottle of beer (355 mL), one 5 oz glass of wine (148 mL), or one 1 oz glass of hard liquor (44 mL). General instructions  Take over-the-counter and prescription medicines only as told by your health care provider.  Keep all follow-up visits. This is important. This includes having regularly scheduled colonoscopies. Talk to your health care provider about when you need a colonoscopy. Contact a health care provider if:  You have new or worsening bleeding during a bowel movement.  You have new or increased blood in your stool.  You have a change in bowel habits.  You lose weight for no known reason. Summary  Colon polyps are tissue growths inside the colon, which is part of the large intestine. They are one type of polyp that can grow in the body.  Most colon polyps are noncancerous (benign), but some can become cancerous over time.  This condition is diagnosed with a colonoscopy.  This condition is treated by removing any polyps that are found. Most polyps can be removed during a colonoscopy. This information is not intended to replace advice given to you by your health care provider. Make sure you discuss any questions you have with your  health care provider. Document Revised: 02/17/2020 Document Reviewed: 02/17/2020 Elsevier Patient Education  2021 [employer].

## 2021-03-17 NOTE — H&P (Signed)
Primary Care Physician:  Fayrene Helper, MD Primary Gastroenterologist:  Dr. Abbey Chatters  Pre-Procedure History & Physical: HPI:  Marcia Hahn is a 45 y.o. female is here for a colonoscopy for colon cancer screening purposes.  Patient denies any family history of colorectal cancer.  No melena or hematochezia.  No abdominal pain or unintentional weight loss.  No change in bowel habits.  Overall feels well from a GI standpoint.  Past Medical History:  Diagnosis Date  . Abnormal mammogram of right breast 09/03/2018  . Acne vulgaris   . CML (chronic myelocytic leukemia) ([place])   . Hypertension   . Mild obesity     Past Surgical History:  Procedure Laterality Date  . BREAST BIOPSY Left   . CESAREAN SECTION    . DILATATION AND CURETTAGE/HYSTEROSCOPY WITH MINERVA N/A 10/26/2020   Procedure: DILATATION AND CURETTAGE/HYSTEROSCOPY WITH MINERVA ENDOMETRIAL ABLATION;  Surgeon: Florian Buff, MD;  Location: [facility];  Service: Gynecology;  Laterality: N/A;  . LAPAROSCOPIC BILATERAL SALPINGECTOMY Bilateral 10/26/2020   Procedure: LAPAROSCOPIC BILATERAL SALPINGECTOMY;  Surgeon: Florian Buff, MD;  Location: [facility];  Service: Gynecology;  Laterality: Bilateral;    Prior to Admission medications   Medication Sig Start Date End Date Taking? Authorizing Provider  loratadine (CLARITIN) 10 MG tablet Take 10 mg by mouth daily as needed.   Yes [provider]  losartan (COZAAR) 50 MG tablet Take 1 tablet (50 mg total) by mouth daily. Patient taking differently: Take 50 mg by mouth at bedtime. 02/11/20  Yes Emokpae, Courage, MD  Multiple Vitamins-Minerals (MULTIVITAMIN WITH MINERALS) tablet Take 1 tablet by mouth at bedtime.   Yes [provider]  spironolactone (ALDACTONE) 25 MG tablet Take 0.5 tablets (12.5 mg total) by mouth daily. Patient taking differently: Take 12.5 mg by mouth at bedtime. 02/28/21 05/29/22 Yes Fayrene Helper, MD  SPRYCEL 70 MG tablet TAKE 1 TABLET (70 MG TOTAL)  BY MOUTH DAILY. Patient taking differently: Take 70 mg by mouth at bedtime. 12/29/20  Yes Derek Jack, MD  SUPREP BOWEL PREP KIT 17.5-3.13-1.6 GM/177ML SOLN Take 354 mLs by mouth as directed. 01/24/21  Yes [provider]    Allergies as of 01/30/2021 - Review Complete 01/24/2021  Allergen Reaction Noted  . Amlodipine  08/04/2019  . Tomato  11/01/2017    Family History  Problem Relation Age of Onset  . GER disease Mother   . Diabetes Mother   . Hypertension Mother   . Heart attack Father 25  . Heart disease Father   . Migraines Sister   . Breast cancer Maternal Grandmother   . Stroke Maternal Grandmother   . Breast cancer Cousin   . Stroke Maternal Aunt   . Colon cancer Neg Hx     Social History   Socioeconomic History  . Marital status: Married    Spouse name: Not on file  . Number of children: 2  . Years of education: Not on file  . Highest education level: Not on file  Occupational History  . Occupation: Psychologist, educational   Tobacco Use  . Smoking status: Former Smoker    Types: Cigarettes    Quit date: 12/04/2012    Years since quitting: 8.2  . Smokeless tobacco: Never Used  Vaping Use  . Vaping Use: Never used  Substance and Sexual Activity  . Alcohol use: Yes    Comment: occasional  . Drug use: No  . Sexual activity: Not Currently    Birth control/protection: Surgical  Comment: tubal and ablation  Other Topics Concern  . Not on file  Social History Narrative   Mother of twins    Social Determinants of Health   Financial Resource Strain: Low Risk   . Difficulty of Paying Living Expenses: Not hard at all  Food Insecurity: No Food Insecurity  . Worried About Charity fundraiser in the Last Year: Never true  . Ran Out of Food in the Last Year: Never true  Transportation Needs: No Transportation Needs  . Lack of Transportation (Medical): No  . Lack of Transportation (Non-Medical): No  Physical Activity: Inactive  . Days of Exercise per  Week: 0 days  . Minutes of Exercise per Session: 0 min  Stress: No Stress Concern Present  . Feeling of Stress : Not at all  Social Connections: Moderately Integrated  . Frequency of Communication with Friends and Family: More than three times a week  . Frequency of Social Gatherings with Friends and Family: Once a week  . Attends Religious Services: 1 to 4 times per year  . Active Member of Clubs or Organizations: No  . Attends Archivist Meetings: Never  . Marital Status: Married  Human resources officer Violence: Not At Risk  . Fear of Current or Ex-Partner: No  . Emotionally Abused: No  . Physically Abused: No  . Sexually Abused: No    Review of Systems: See HPI, otherwise negative ROS  Physical Exam: Vital signs in last 24 hours: Temp:  [98.7 F (37.1 C)] 98.7 F (37.1 C) (05/06 0653) Pulse Rate:  [66] 66 (05/06 0653) Resp:  [15] 15 (05/06 0653) BP: (153)/(91) 153/91 (05/06 0653) SpO2:  [99 %] 99 % (05/06 0653) Weight:  [93.9 kg] 93.9 kg (05/06 0653)   General:   Alert,  Well-developed, well-nourished, pleasant and cooperative in NAD Head:  Normocephalic and atraumatic. Eyes:  Sclera clear, no icterus.   Conjunctiva pink. Ears:  Normal auditory acuity. Nose:  No deformity, discharge,  or lesions. Mouth:  No deformity or lesions, dentition normal. Neck:  Supple; no masses or thyromegaly. Lungs:  Clear throughout to auscultation.   No wheezes, crackles, or rhonchi. No acute distress. Heart:  Regular rate and rhythm; no murmurs, clicks, rubs,  or gallops. Abdomen:  Soft, nontender and nondistended. No masses, hepatosplenomegaly or hernias noted. Normal bowel sounds, without guarding, and without rebound.   Msk:  Symmetrical without gross deformities. Normal posture. Extremities:  Without clubbing or edema. Neurologic:  Alert and  oriented x4;  grossly normal neurologically. Skin:  Intact without significant lesions or rashes. Cervical Nodes:  No significant cervical  adenopathy. Psych:  Alert and cooperative. Normal mood and affect.  Impression/Plan: Marcia Hahn is here for a colonoscopy to be performed for colon cancer screening purposes.  The risks of the procedure including infection, bleed, or perforation as well as benefits, limitations, alternatives and imponderables have been reviewed with the patient. Questions have been answered. All parties agreeable.

## 2021-03-17 NOTE — Op Note (Signed)
[facility] Patient Name: Marcia Hahn Procedure Date: 03/17/2021 6:59 AM MRN: [id number] Date of Birth: August 14, 1976 Attending MD: Elon Alas. Abbey Chatters DO CSN: [id number] Age: 45 Admit Type: Outpatient Procedure:                Colonoscopy Indications:              Screening for colorectal malignant neoplasm Providers:                Elon Alas. Abbey Chatters, DO, Gwenlyn Fudge, RN, Aram Candela Referring MD:              Medicines:                See the Anesthesia note for documentation of the                            administered medications Complications:            No immediate complications. Estimated Blood Loss:     Estimated blood loss was minimal. Procedure:                Pre-Anesthesia Assessment:                           - The anesthesia plan was to use monitored                            anesthesia care (MAC).                           After obtaining informed consent, the colonoscope                            was passed under direct vision. Throughout the                            procedure, the patient's blood pressure, pulse, and                            oxygen saturations were monitored continuously. The                            CF-HQ190L ([id number]) scope was introduced through                            the anus and advanced to the the cecum, identified                            by appendiceal orifice and ileocecal valve. The                            colonoscopy was performed without difficulty. The                            patient tolerated the procedure well. The quality  of the bowel preparation was evaluated using the                            BBPS [facility] Bowel Preparation Scale) with scores                            of: Right Colon = 1 (portion of mucosa seen, but                            other areas not well seen due to staining, residual                            stool and/or opaque liquid),  Transverse Colon = 2                            (minor amount of residual staining, small fragments                            of stool and/or opaque liquid, but mucosa seen                            well) and Left Colon = 1 (portion of mucosa seen,                            but other areas not well seen due to staining,                            residual stool and/or opaque liquid). The total                            BBPS score equals 4. The quality of the bowel                            preparation was inadequate. Scope In: 7:44:34 AM Scope Out: 7:58:45 AM Scope Withdrawal Time: 0 hours 7 minutes 5 seconds  Total Procedure Duration: 0 hours 14 minutes 11 seconds  Findings:      Skin tags were found on perianal exam.      Non-bleeding internal hemorrhoids were found during endoscopy.      A 5 mm polyp was found in the transverse colon. The polyp was sessile.       The polyp was removed with a cold snare. Resection and retrieval were       complete.      A large amount of stool was found in the entire colon, precluding       visualization. Lavage of the area was performed using copious amounts of       sterile water, resulting in incomplete clearance with continued poor       visualization. Impression:               - Preparation of the colon was inadequate.                           - Perianal skin tags found on  perianal exam.                           - Non-bleeding internal hemorrhoids.                           - One 5 mm polyp in the transverse colon, removed                            with a cold snare. Resected and retrieved.                           - Stool in the entire examined colon. Moderate Sedation:      Per Anesthesia Care Recommendation:           - Patient has a contact number available for                            emergencies. The signs and symptoms of potential                            delayed complications were discussed with the                             patient. Return to normal activities tomorrow.                            Written discharge instructions were provided to the                            patient.                           - Resume previous diet.                           - Continue present medications.                           - Await pathology results.                           - Repeat colonoscopy in 6 months because the bowel                            preparation was suboptimal.                           - Return to GI clinic in 3 months. Procedure Code(s):        --- Professional ---                           [phone], Colonoscopy, flexible; with removal of                            tumor(s), polyp(s), or other lesion(s) by snare  technique Diagnosis Code(s):        --- Professional ---                           Z12.11, Encounter for screening for malignant                            neoplasm of colon                           K64.8, Other hemorrhoids                           K63.5, Polyp of colon                           K64.4, Residual hemorrhoidal skin tags CPT copyright 2019 American Medical Association. All rights reserved. The codes documented in this report are preliminary and upon coder review may  be revised to meet current compliance requirements. Elon Alas. Abbey Chatters, DO [place] Abbey Chatters, DO 03/17/2021 8:02:23 AM This report has been signed electronically. Number of Addenda: 0

## 2021-03-17 NOTE — Transfer of Care (Signed)
Immediate Anesthesia Transfer of Care Note  Patient: Marcia Hahn  Procedure(s) Performed: COLONOSCOPY WITH PROPOFOL (N/A ) POLYPECTOMY  Patient Location: Endoscopy Unit  Anesthesia Type:General  Level of Consciousness: awake, alert  and oriented  Airway & Oxygen Therapy: Patient Spontanous Breathing  Post-op Assessment: Report given to RN and Post -op Vital signs reviewed and stable  Post vital signs: Reviewed and stable  Last Vitals:  Vitals Value Taken Time  BP 116/91 03/17/21 0801  Temp 37.1 C 03/17/21 0801  Pulse    Resp 20 03/17/21 0801  SpO2 99 % 03/17/21 0801    Last Pain:  Vitals:   03/17/21 0801  TempSrc: Oral  PainSc: 0-No pain      Patients Stated Pain Goal: 7 (99/35/70 1779)  Complications: No complications documented.

## 2021-03-20 LAB — SURGICAL PATHOLOGY

## 2021-03-22 ENCOUNTER — Inpatient Hospital Stay (HOSPITAL_COMMUNITY): Payer: BC Managed Care – PPO | Attending: Hematology

## 2021-03-22 ENCOUNTER — Other Ambulatory Visit: Payer: Self-pay

## 2021-03-22 DIAGNOSIS — Z800 Family history of malignant neoplasm of digestive organs: Secondary | ICD-10-CM | POA: Diagnosis not present

## 2021-03-22 DIAGNOSIS — Z79899 Other long term (current) drug therapy: Secondary | ICD-10-CM | POA: Insufficient documentation

## 2021-03-22 DIAGNOSIS — Z803 Family history of malignant neoplasm of breast: Secondary | ICD-10-CM | POA: Diagnosis not present

## 2021-03-22 DIAGNOSIS — R110 Nausea: Secondary | ICD-10-CM | POA: Diagnosis not present

## 2021-03-22 DIAGNOSIS — D649 Anemia, unspecified: Secondary | ICD-10-CM | POA: Diagnosis not present

## 2021-03-22 DIAGNOSIS — I10 Essential (primary) hypertension: Secondary | ICD-10-CM | POA: Insufficient documentation

## 2021-03-22 DIAGNOSIS — C9110 Chronic lymphocytic leukemia of B-cell type not having achieved remission: Secondary | ICD-10-CM | POA: Diagnosis not present

## 2021-03-22 DIAGNOSIS — R161 Splenomegaly, not elsewhere classified: Secondary | ICD-10-CM | POA: Insufficient documentation

## 2021-03-22 DIAGNOSIS — C9210 Chronic myeloid leukemia, BCR/ABL-positive, not having achieved remission: Secondary | ICD-10-CM

## 2021-03-22 LAB — COMPREHENSIVE METABOLIC PANEL
ALT: 21 U/L (ref 0–44)
AST: 22 U/L (ref 15–41)
Albumin: 4.1 g/dL (ref 3.5–5.0)
Alkaline Phosphatase: 46 U/L (ref 38–126)
Anion gap: 7 (ref 5–15)
BUN: 16 mg/dL (ref 6–20)
CO2: 25 mmol/L (ref 22–32)
Calcium: 8.6 mg/dL — ABNORMAL LOW (ref 8.9–10.3)
Chloride: 104 mmol/L (ref 98–111)
Creatinine, Ser: 0.86 mg/dL (ref 0.44–1.00)
GFR, Estimated: >60 mL/min (ref >60)
Glucose, Bld: 97 mg/dL (ref 70–99)
Potassium: 3.5 mmol/L (ref 3.5–5.1)
Sodium: 136 mmol/L (ref 135–145)
Total Bilirubin: 0.4 mg/dL (ref 0.3–1.2)
Total Protein: 7.1 g/dL (ref 6.5–8.1)

## 2021-03-22 LAB — CBC WITH DIFFERENTIAL/PLATELET
Abs Immature Granulocytes: 0.01 10*3/uL (ref 0.00–0.07)
Basophils Absolute: 0 10*3/uL (ref 0.0–0.1)
Basophils Relative: 1 %
Eosinophils Absolute: 0.2 10*3/uL (ref 0.0–0.5)
Eosinophils Relative: 3 %
HCT: 38.1 % (ref 36.0–46.0)
Hemoglobin: 12.4 g/dL (ref 12.0–15.0)
Immature Granulocytes: 0 %
Lymphocytes Relative: 33 %
Lymphs Abs: 1.9 10*3/uL (ref 0.7–4.0)
MCH: 30.8 pg (ref 26.0–34.0)
MCHC: 32.5 g/dL (ref 30.0–36.0)
MCV: 94.5 fL (ref 80.0–100.0)
Monocytes Absolute: 0.3 10*3/uL (ref 0.1–1.0)
Monocytes Relative: 5 %
Neutro Abs: 3.4 10*3/uL (ref 1.7–7.7)
Neutrophils Relative %: 58 %
Platelets: 256 10*3/uL (ref 150–400)
RBC: 4.03 MIL/uL (ref 3.87–5.11)
RDW: 13.1 % (ref 11.5–15.5)
WBC: 5.8 10*3/uL (ref 4.0–10.5)
nRBC: 0 % (ref 0.0–0.2)

## 2021-03-22 LAB — LACTATE DEHYDROGENASE: LDH: 144 U/L (ref 98–192)

## 2021-03-23 ENCOUNTER — Encounter (HOSPITAL_COMMUNITY): Payer: Self-pay | Admitting: Internal Medicine

## 2021-03-28 NOTE — Progress Notes (Signed)
[name], [place] [address]   CLINIC:  Medical Oncology/Hematology  PCP:  Fayrene Helper, MD [address] [place] / [place] [address] [phone]   REASON FOR VISIT:  Follow-up for CML  PRIOR THERAPY: none  NGS Results: not done  CURRENT THERAPY:  Dasatinib 70 mg daily  BRIEF ONCOLOGIC HISTORY:  Oncology History   No history exists.    CANCER STAGING: Cancer Staging No matching staging information was found for the patient.  INTERVAL HISTORY:  [name], a 45 y.o. female, returns for routine follow-up of her CML. Marcia Hahn was last seen on 12/13/2020.   Today she reports feeling well. She reports mild occasional nausea, but denies vomiting, diarrhea, or SOB. She reports good appetite and denies fatigue. She denies significant weight gain and attributes any current weight gain to dietary habits.  REVIEW OF SYSTEMS:  Review of Systems  Constitutional: Negative for appetite change and fatigue.  Gastrointestinal: Positive for nausea (occasional, mild). Negative for diarrhea and vomiting.  All other systems reviewed and are negative.   PAST MEDICAL/SURGICAL HISTORY:  Past Medical History:  Diagnosis Date  . Abnormal mammogram of right breast 09/03/2018  . Acne vulgaris   . CML (chronic myelocytic leukemia) ([place])   . Hypertension   . Mild obesity    Past Surgical History:  Procedure Laterality Date  . BREAST BIOPSY Left   . CESAREAN SECTION    . COLONOSCOPY WITH PROPOFOL N/A 03/17/2021   Procedure: COLONOSCOPY WITH PROPOFOL;  Surgeon: Eloise Harman, DO;  Location: [facility];  Service: Endoscopy;  Laterality: N/A;  ASA II / AM procedure  . DILATATION AND CURETTAGE/HYSTEROSCOPY WITH MINERVA N/A 10/26/2020   Procedure: DILATATION AND CURETTAGE/HYSTEROSCOPY WITH MINERVA ENDOMETRIAL ABLATION;  Surgeon: Florian Buff, MD;  Location: [facility];  Service: Gynecology;  Laterality: N/A;  . LAPAROSCOPIC  BILATERAL SALPINGECTOMY Bilateral 10/26/2020   Procedure: LAPAROSCOPIC BILATERAL SALPINGECTOMY;  Surgeon: Florian Buff, MD;  Location: [facility];  Service: Gynecology;  Laterality: Bilateral;  . POLYPECTOMY  03/17/2021   Procedure: POLYPECTOMY;  Surgeon: Eloise Harman, DO;  Location: [facility];  Service: Endoscopy;;    SOCIAL HISTORY:  Social History   Socioeconomic History  . Marital status: Married    Spouse name: Not on file  . Number of children: 2  . Years of education: Not on file  . Highest education level: Not on file  Occupational History  . Occupation: Psychologist, educational   Tobacco Use  . Smoking status: Former Smoker    Types: Cigarettes    Quit date: 12/04/2012    Years since quitting: 8.3  . Smokeless tobacco: Never Used  Vaping Use  . Vaping Use: Never used  Substance and Sexual Activity  . Alcohol use: Yes    Comment: occasional  . Drug use: No  . Sexual activity: Not Currently    Birth control/protection: Surgical    Comment: tubal and ablation  Other Topics Concern  . Not on file  Social History Narrative   Mother of twins    Social Determinants of Health   Financial Resource Strain: Low Risk   . Difficulty of Paying Living Expenses: Not hard at all  Food Insecurity: No Food Insecurity  . Worried About Charity fundraiser in the Last Year: Never true  . Ran Out of Food in the Last Year: Never true  Transportation Needs: No Transportation Needs  . Lack of Transportation (  Medical): No  . Lack of Transportation (Non-Medical): No  Physical Activity: Inactive  . Days of Exercise per Week: 0 days  . Minutes of Exercise per Session: 0 min  Stress: No Stress Concern Present  . Feeling of Stress : Not at all  Social Connections: Moderately Integrated  . Frequency of Communication with Friends and Family: More than three times a week  . Frequency of Social Gatherings with Friends and Family: Once a week  . Attends Religious Services: 1 to 4 times per year   . Active Member of Clubs or Organizations: No  . Attends Archivist Meetings: Never  . Marital Status: Married  Human resources officer Violence: Not At Risk  . Fear of Current or Ex-Partner: No  . Emotionally Abused: No  . Physically Abused: No  . Sexually Abused: No    FAMILY HISTORY:  Family History  Problem Relation Age of Onset  . GER disease Mother   . Diabetes Mother   . Hypertension Mother   . Heart attack Father 53  . Heart disease Father   . Migraines Sister   . Breast cancer Maternal Grandmother   . Stroke Maternal Grandmother   . Breast cancer Cousin   . Stroke Maternal Aunt   . Colon cancer Neg Hx     CURRENT MEDICATIONS:  Current Outpatient Medications  Medication Sig Dispense Refill  . loratadine (CLARITIN) 10 MG tablet Take 10 mg by mouth daily as needed.    Marland Kitchen losartan (COZAAR) 50 MG tablet Take 1 tablet (50 mg total) by mouth daily. (Patient taking differently: Take 50 mg by mouth at bedtime.) 90 tablet 3  . Multiple Vitamins-Minerals (MULTIVITAMIN WITH MINERALS) tablet Take 1 tablet by mouth at bedtime.    Marland Kitchen spironolactone (ALDACTONE) 25 MG tablet Take 0.5 tablets (12.5 mg total) by mouth daily. (Patient taking differently: Take 12.5 mg by mouth at bedtime.) 45 tablet 3  . SPRYCEL 70 MG tablet TAKE 1 TABLET (70 MG TOTAL) BY MOUTH DAILY. (Patient taking differently: Take 70 mg by mouth at bedtime.) 30 tablet 6  . SUPREP BOWEL PREP KIT 17.5-3.13-1.6 GM/177ML SOLN Take 354 mLs by mouth as directed.     No current facility-administered medications for this visit.    ALLERGIES:  Allergies  Allergen Reactions  . Amlodipine     Headache   . Tomato Itching and Swelling    Processed tomatoes in the can.     PHYSICAL EXAM:  Performance status (ECOG): 1 - Symptomatic but completely ambulatory  There were no vitals filed for this visit. Wt Readings from Last 3 Encounters:  03/17/21 207 lb (93.9 kg)  01/24/21 212 lb 12.8 oz (96.5 kg)  01/02/21 208 lb  (94.3 kg)   Physical Exam Vitals reviewed.  Constitutional:      Appearance: Normal appearance. She is obese.  Cardiovascular:     Rate and Rhythm: Normal rate and regular rhythm.     Pulses: Normal pulses.     Heart sounds: Normal heart sounds.  Pulmonary:     Effort: Pulmonary effort is normal.     Breath sounds: Normal breath sounds.  Abdominal:     Palpations: Abdomen is soft. There is no hepatomegaly or splenomegaly.     Tenderness: There is no abdominal tenderness.  Musculoskeletal:     Right lower leg: No edema.     Left lower leg: No edema.  Neurological:     General: No focal deficit present.     Mental Status: She  is alert and oriented to person, place, and time.  Psychiatric:        Mood and Affect: Mood normal.        Behavior: Behavior normal.      LABORATORY DATA:  I have reviewed the labs as listed.  CBC Latest Ref Rng & Units 03/22/2021 11/21/2020 10/25/2020  WBC 4.0 - 10.5 K/uL 5.8 6.5 6.6  Hemoglobin 12.0 - 15.0 g/dL 12.4 11.8(L) 11.4(L)  Hematocrit 36.0 - 46.0 % 38.1 37.4 35.0(L)  Platelets 150 - 400 K/uL 256 290 243   CMP Latest Ref Rng & Units 03/22/2021 03/15/2021 11/21/2020  Glucose 70 - 99 mg/dL 97 88 80  BUN 6 - 20 mg/dL '16 19 12  ' Creatinine 0.44 - 1.00 mg/dL 0.86 1.01(H) 0.99  Sodium 135 - 145 mmol/L 136 136 137  Potassium 3.5 - 5.1 mmol/L 3.5 3.5 3.5  Chloride 98 - 111 mmol/L 104 105 105  CO2 22 - 32 mmol/L '25 25 23  ' Calcium 8.9 - 10.3 mg/dL 8.6(L) 8.3(L) 8.9  Total Protein 6.5 - 8.1 g/dL 7.1 - 7.0  Total Bilirubin 0.3 - 1.2 mg/dL 0.4 - 0.2(L)  Alkaline Phos 38 - 126 U/L 46 - 43  AST 15 - 41 U/L 22 - 19  ALT 0 - 44 U/L 21 - 21    DIAGNOSTIC IMAGING:  I have independently reviewed the scans and discussed with the patient. No results found.   ASSESSMENT:  1. CML in chronic phase: -Presentation with hyperleukocytosis. BCR/ABL by FISH positive. -CT chest PE protocol showed splenomegaly measuring 14 cm. -BMBX on 02/23/2020 shows  hypercellular marrow with myeloid hyperplasia consistent with CML. No increase in blasts. Flow cytometry less than 1% blasts. Chromosome analysis shows Philadelphia chromosome. -Dasatinib 70 mg daily started on 04/01/2020. -BCR/ABL by quantitative PCR on 06/30/2020 shows B3 A2 transcript improved to 2.19 from 128 previously.B2 A2 andE1A2 transcripts are undetectable. -BCR/ABL by quantitative PCR on 11/21/2020 -.   PLAN:  1. CML in chronic phase: -Physical examination today did not reveal any palpable splenomegaly or lymphadenopathy. - Last BCR/ABL by quantitative PCR in January 2022 was negative. - Reviewed labs today which showed normal white count and platelet count.  LDH was normal.  LFTs were normal. - No clinical evidence of fluid retention or pleural effusions. - Continue Dasatinib 70 mg daily. - We will follow-up on BCR/ABL from today.  We will see her back in 3 months with repeat labs.  2. Hypertension: -Continue losartan and spironolactone daily.  Blood pressure today is 146/87.  3. Normocytic anemia: -She stopped taking iron tablet.  Her hemoglobin today has improved to 12.4.   Orders placed this encounter:  No orders of the defined types were placed in this encounter.    Derek Jack, MD [place] [phone]   I, Thana Ates, am acting as a scribe for Dr. Derek Jack.  I, Derek Jack MD, have reviewed the above documentation for accuracy and completeness, and I agree with the above.

## 2021-03-29 ENCOUNTER — Other Ambulatory Visit: Payer: Self-pay

## 2021-03-29 ENCOUNTER — Inpatient Hospital Stay (HOSPITAL_BASED_OUTPATIENT_CLINIC_OR_DEPARTMENT_OTHER): Payer: BC Managed Care – PPO | Admitting: Hematology

## 2021-03-29 VITALS — BP 146/87 | HR 85 | Temp 97.3°F | Resp 18 | Wt 208.4 lb

## 2021-03-29 DIAGNOSIS — C9110 Chronic lymphocytic leukemia of B-cell type not having achieved remission: Secondary | ICD-10-CM | POA: Diagnosis not present

## 2021-03-29 DIAGNOSIS — C9210 Chronic myeloid leukemia, BCR/ABL-positive, not having achieved remission: Secondary | ICD-10-CM

## 2021-03-29 DIAGNOSIS — R110 Nausea: Secondary | ICD-10-CM | POA: Diagnosis not present

## 2021-03-29 DIAGNOSIS — Z800 Family history of malignant neoplasm of digestive organs: Secondary | ICD-10-CM | POA: Diagnosis not present

## 2021-03-29 DIAGNOSIS — I10 Essential (primary) hypertension: Secondary | ICD-10-CM | POA: Diagnosis not present

## 2021-03-29 DIAGNOSIS — R161 Splenomegaly, not elsewhere classified: Secondary | ICD-10-CM | POA: Diagnosis not present

## 2021-03-29 DIAGNOSIS — Z803 Family history of malignant neoplasm of breast: Secondary | ICD-10-CM | POA: Diagnosis not present

## 2021-03-29 DIAGNOSIS — Z79899 Other long term (current) drug therapy: Secondary | ICD-10-CM | POA: Diagnosis not present

## 2021-03-29 DIAGNOSIS — D649 Anemia, unspecified: Secondary | ICD-10-CM | POA: Diagnosis not present

## 2021-03-29 NOTE — Patient Instructions (Signed)
[facility] Discharge Instructions  You were seen today by Dr. Katragadda. He went over your recent results. Dr. Katragadda will see you back in 3 months for labs and follow up.   Thank you for choosing [facility] at [facility] to provide your oncology and hematology care.  To afford each patient quality time with our provider, please arrive at least 15 minutes before your scheduled appointment time.   If you have a lab appointment with the Cancer Center please come in thru the Main Entrance and check in at the main information desk  You need to re-schedule your appointment should you arrive 10 or more minutes late.  We strive to give you quality time with our providers, and arriving late affects you and other patients whose appointments are after yours.  Also, if you no show three or more times for appointments you may be dismissed from the clinic at the providers discretion.     Again, thank you for choosing [facility].  Our hope is that these requests will decrease the amount of time that you wait before being seen by our physicians.       _____________________________________________________________  Should you have questions after your visit to [facility], please contact our office at [phone] between the hours of 8:00 a.m. and 4:30 p.m.  Voicemails left after 4:00 p.m. will not be returned until the following business day.  For prescription refill requests, have your pharmacy contact our office and allow 72 hours.    Cancer Center Support Programs:   > Cancer Support Group  2nd Tuesday of the month 1pm-2pm, Journey Room   

## 2021-03-31 LAB — BCR-ABL1, CML/ALL, PCR, QUANT: Interpretation (BCRAL):: NEGATIVE

## 2021-04-21 NOTE — Progress Notes (Signed)
Recall placed

## 2021-04-30 IMAGING — CT CT ANGIO CHEST
2 of 7 series · 18 of 46 positions shown · IV contrast (Omnipaque or Isovue)
Comparison: 02/08/2020

CLINICAL DATA: Central chest pain and shortness of breath for 2
days, pain radiates to left side neck, hypertension

EXAM:
CT ANGIOGRAPHY CHEST WITH CONTRAST
TECHNIQUE: Multidetector CT imaging of the chest was performed using the
standard protocol during bolus administration of intravenous
contrast. Multiplanar CT image reconstructions and MIPs were
obtained to evaluate the vascular anatomy.
CONTRAST:  100mL OMNIPAQUE IOHEXOL 350 MG/ML SOLN

[Series 5: pe axial thins · axial · 0.61mm/px · z∈[+1269,+1477]mm · 15 of 238 slices shown]
[im 15/238  lung]
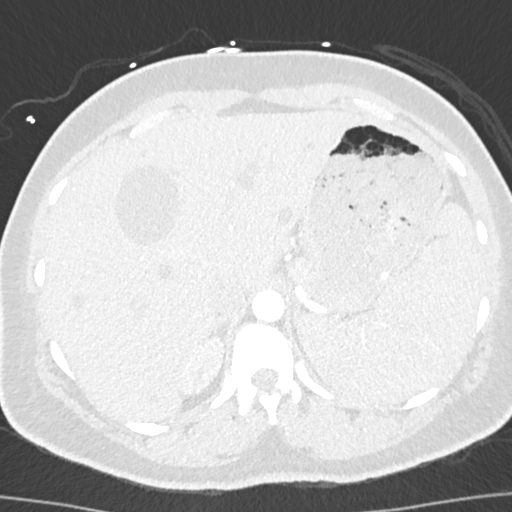
[im 30/238  soft-tissue]
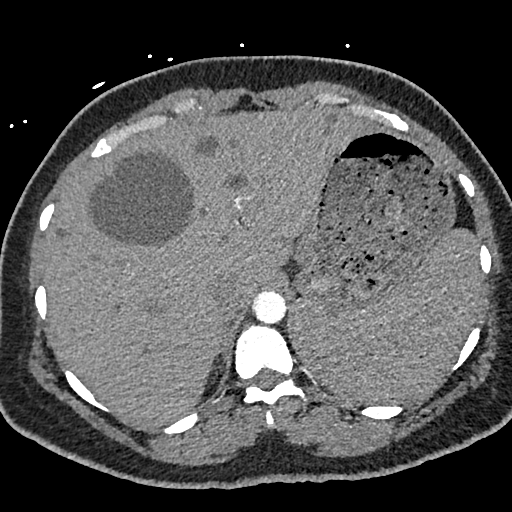
[im 45/238  lung]
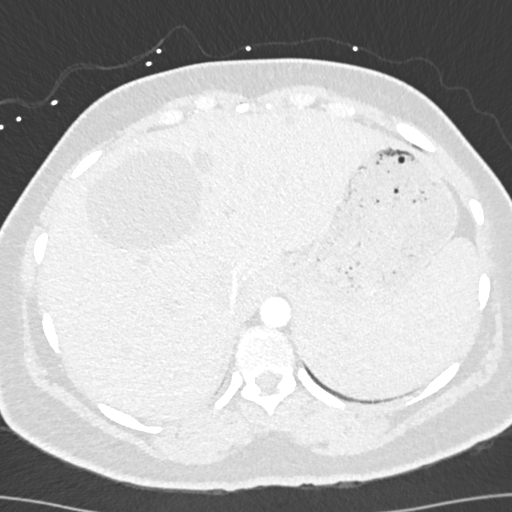
[im 60/238  soft-tissue]
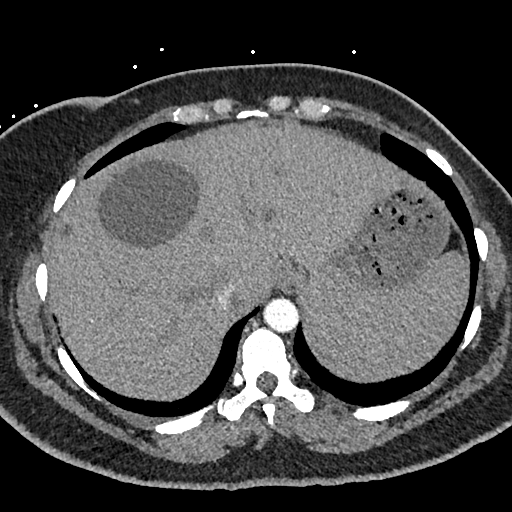
[im 75/238  lung]
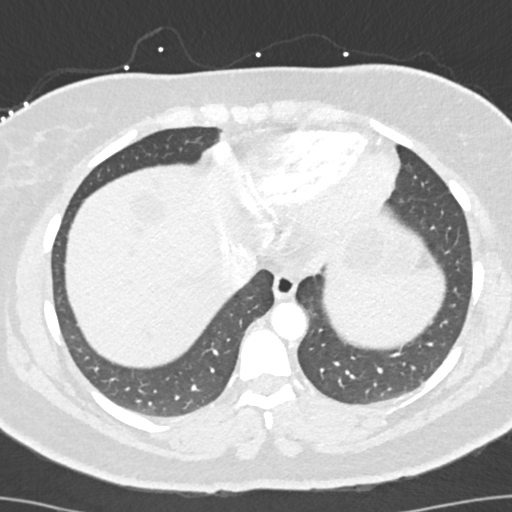
[im 89/238  soft-tissue]
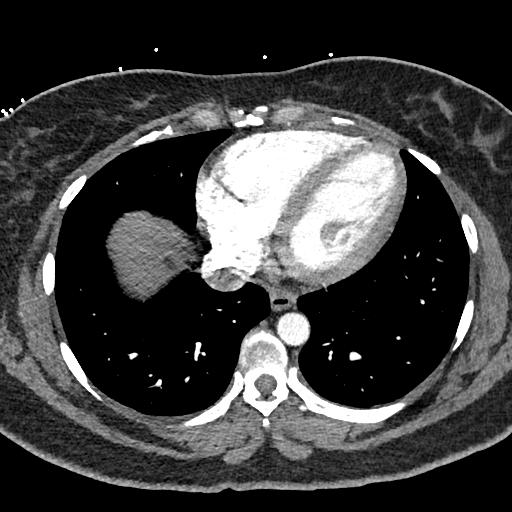
[im 104/238  lung]
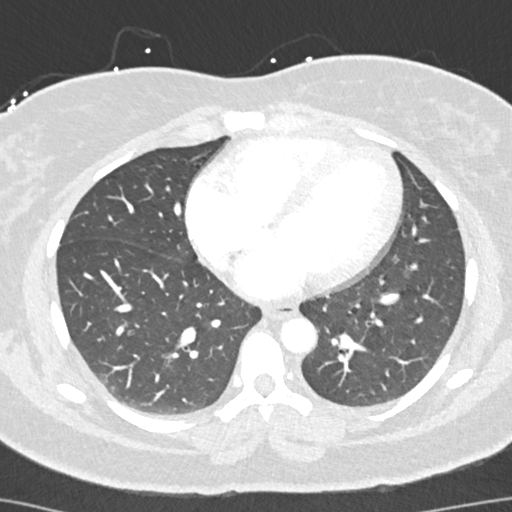
[im 119/238  soft-tissue]
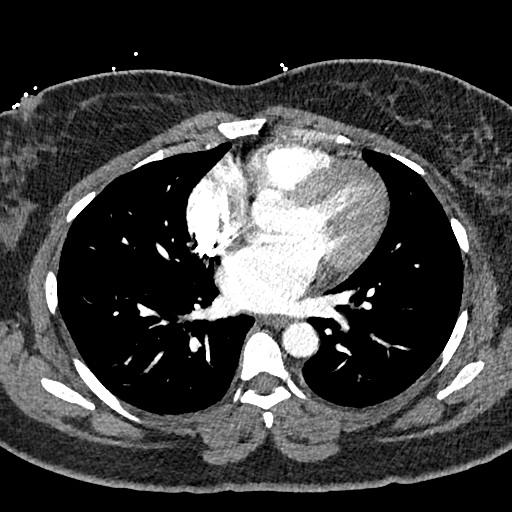
[im 134/238  lung]
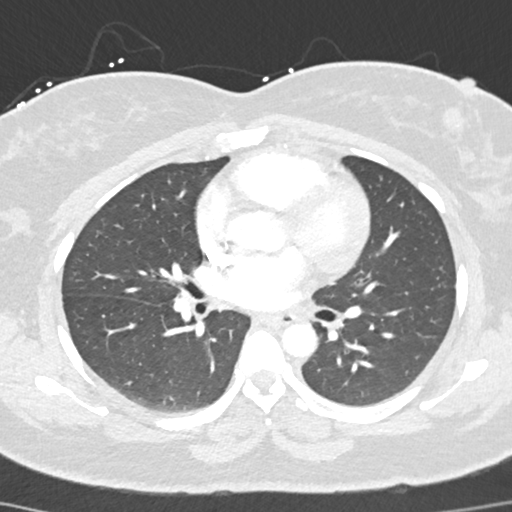
[im 149/238  soft-tissue]
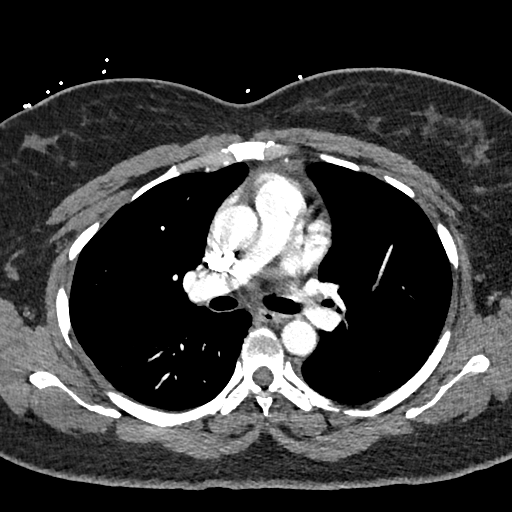
[im 163/238  lung]
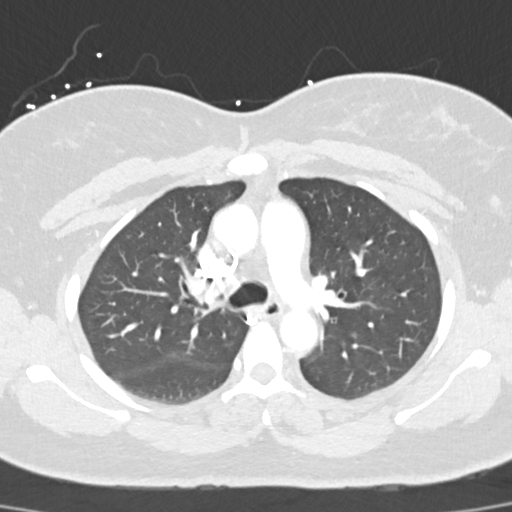
[im 178/238  soft-tissue]
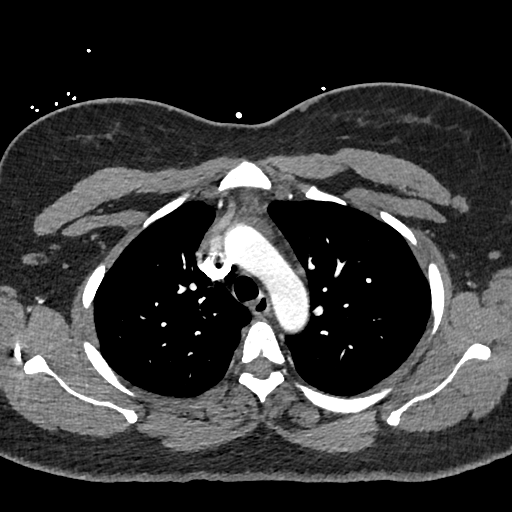
[im 193/238  lung]
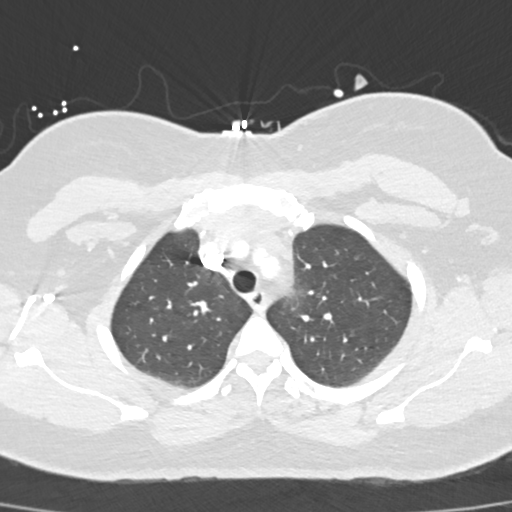
[im 208/238  soft-tissue]
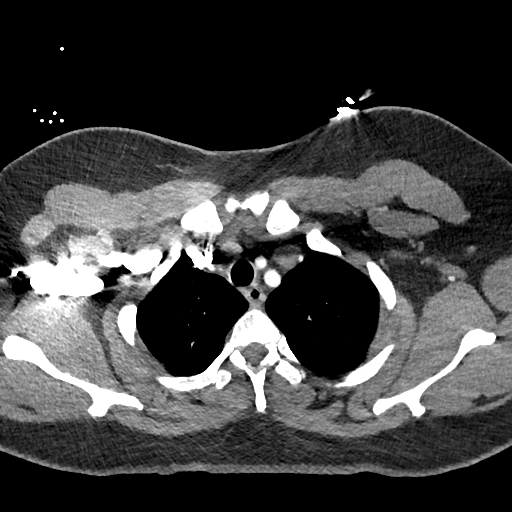
[im 223/238  lung]
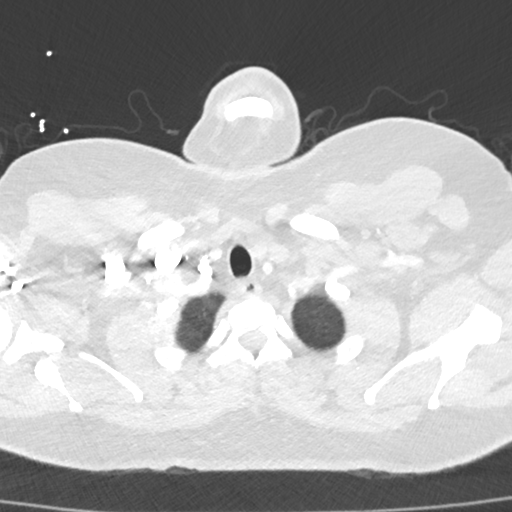

[Series 7: cor soft · coronal · 0.52mm/px · 3 of 147 slices shown]
[im 37/147  soft-tissue]
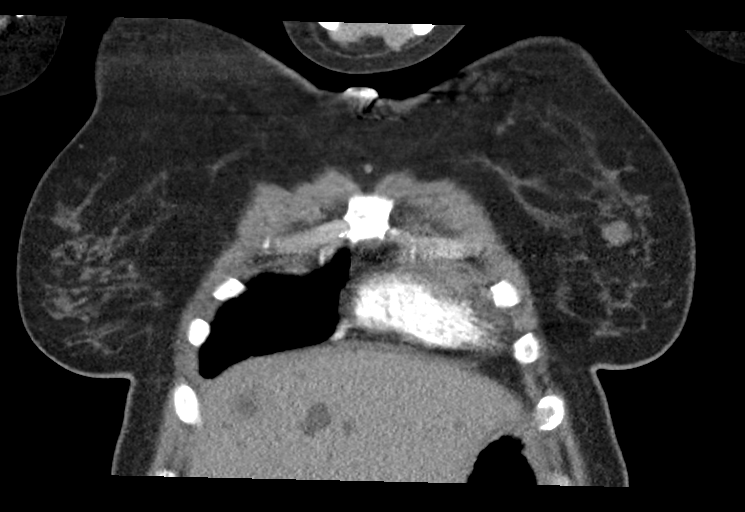
[im 74/147  soft-tissue]
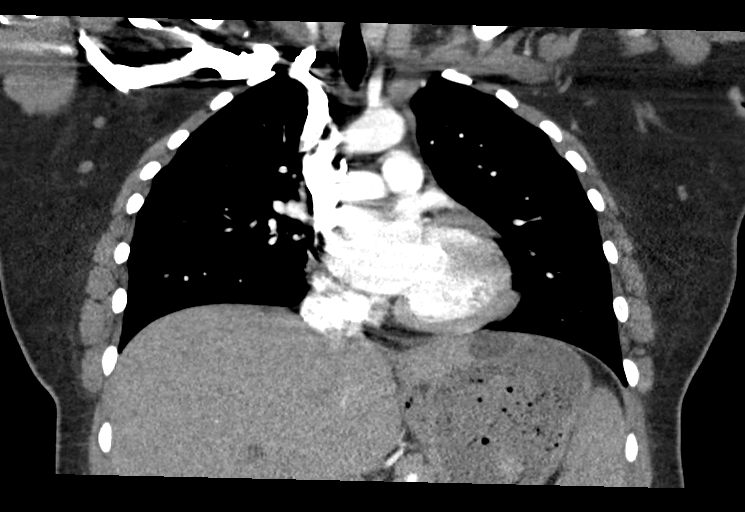
[im 110/147  soft-tissue]
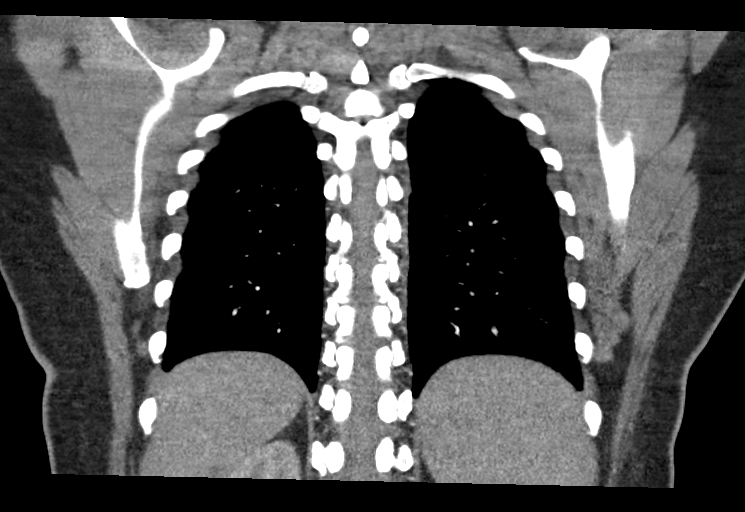

[18 of 46 positions shown; findings below may reference images not displayed]

FINDINGS: Cardiovascular: This is a technically adequate evaluation of the
pulmonary vasculature. No filling defects or pulmonary emboli. Heart
is unremarkable without pericardial effusion. Thoracic aorta is
normal.

Mediastinum/Nodes: No enlarged mediastinal, hilar, or axillary lymph
nodes. Thyroid gland, trachea, and esophagus demonstrate no
significant findings.

Lungs/Pleura: No airspace disease, effusion, or pneumothorax.
Central airways are widely patent.

Upper Abdomen: Numerous hepatic cysts are identified. Mild splenic
enlargement measuring 14 cm in anterior-posterior dimension. This is
incompletely imaged on this study.

Musculoskeletal: No acute or destructive bony lesions. Reconstructed
images demonstrate no additional findings.

Review of the MIP images confirms the above findings.
IMPRESSION: 1. No evidence of pulmonary embolus.
2. No acute intrathoracic process.
3. Splenomegaly.

## 2021-04-30 IMAGING — DX DG CHEST 1V PORT
1 series · 1 of 1 positions shown · non-contrast
Comparison: January 25, 2015

CLINICAL DATA: Chest pain

EXAM:
PORTABLE CHEST 1 VIEW

[chest ap]
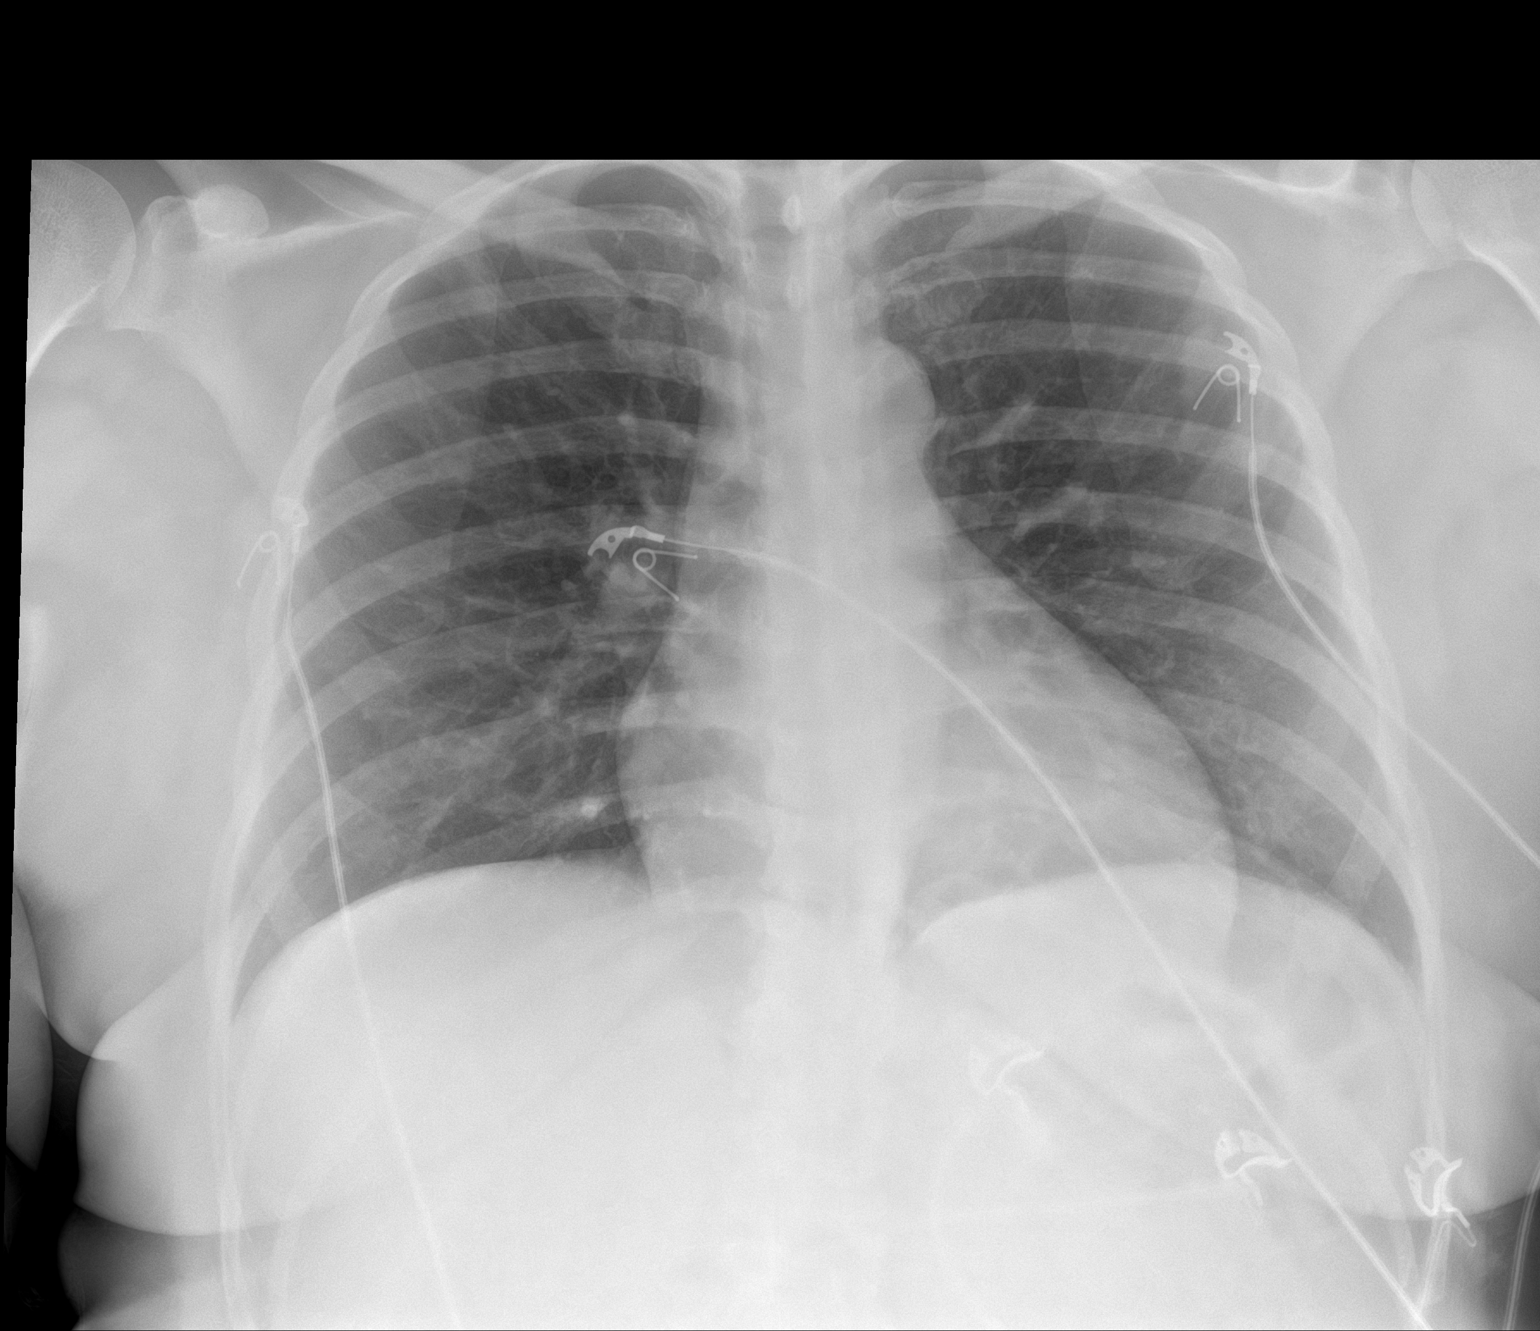

[1 of 1 positions shown; findings below may reference images not displayed]

FINDINGS: The lungs are clear. Heart is upper normal in size with pulmonary
vascularity normal. No adenopathy. No pneumothorax. No bone lesions.
IMPRESSION: Lungs clear.  Heart upper normal in size.

## 2021-05-15 IMAGING — CT CT BIOPSY
1 of 2 series · 15 of 29 positions shown, 19 images · non-contrast
Comparison: none

INDICATION: 43-year-old female with a history of leukocytosis and possible CML

[Series 2: i-spiral 5.0 b40f · axial · 0.98mm/px · z∈[+1312,+1386]mm · 15 of 25 slices shown, 19 images]
[im 2/25  mediastinal]
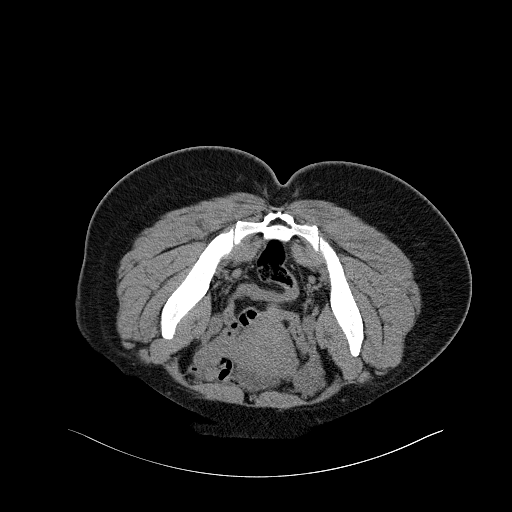
[im 2/25  lung]
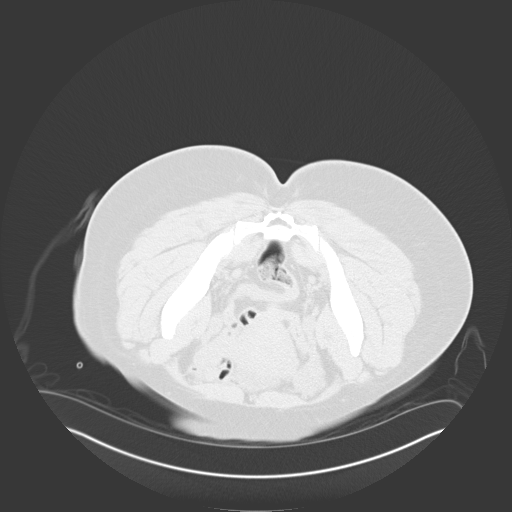
[im 3/25  lung]
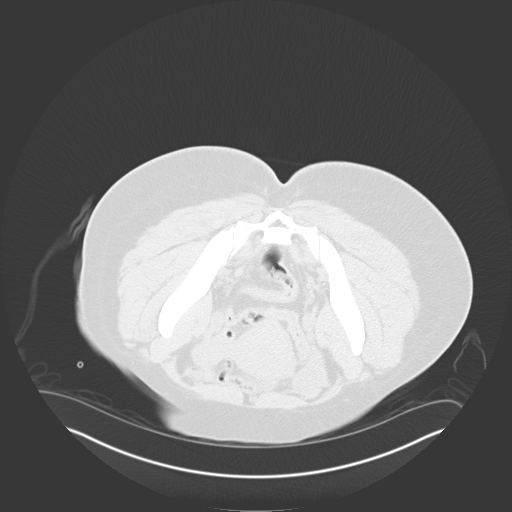
[im 5/25  lung]
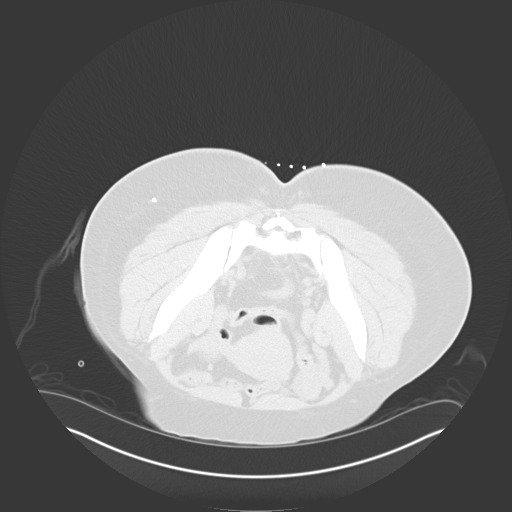
[im 7/25  lung]
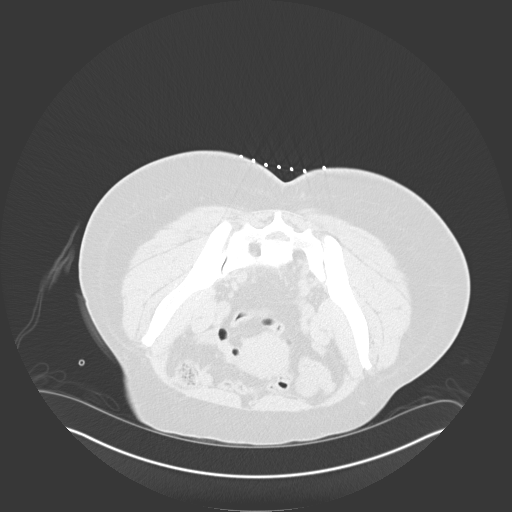
[im 8/25  mediastinal]
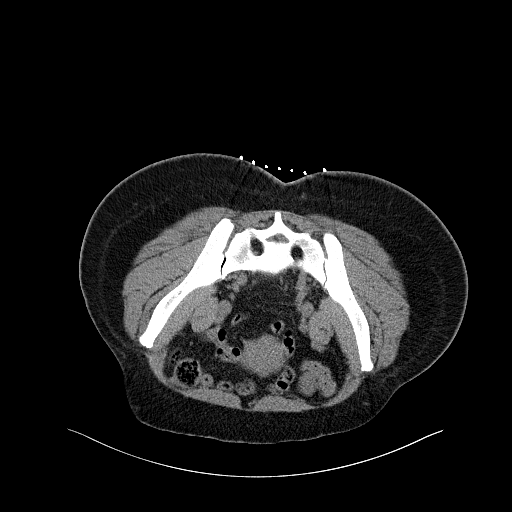
[im 8/25  lung]
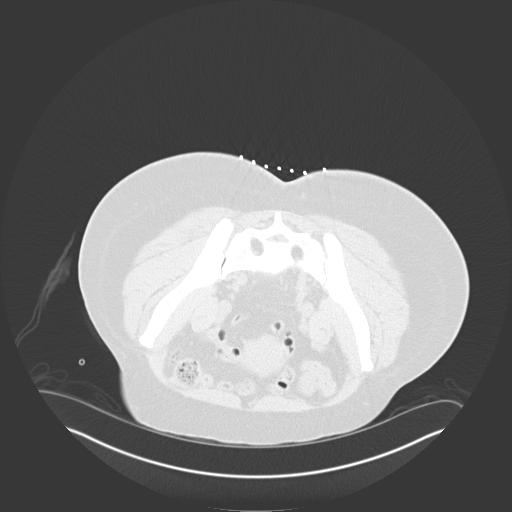
[im 9/25  lung]
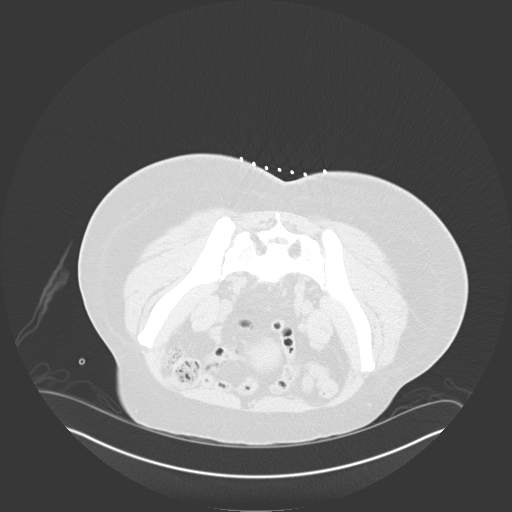
[im 11/25  lung]
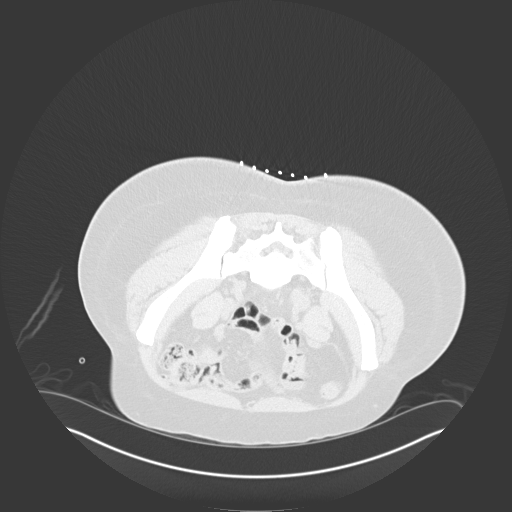
[im 13/25  lung]
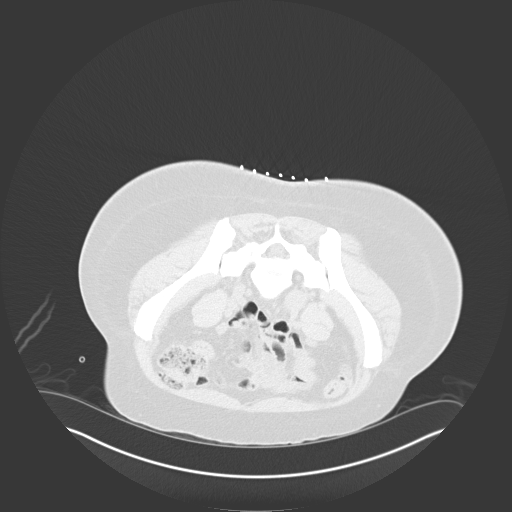
[im 14/25  mediastinal]
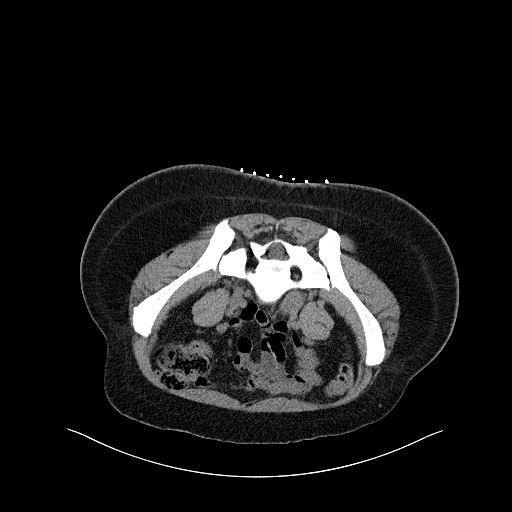
[im 14/25  lung]
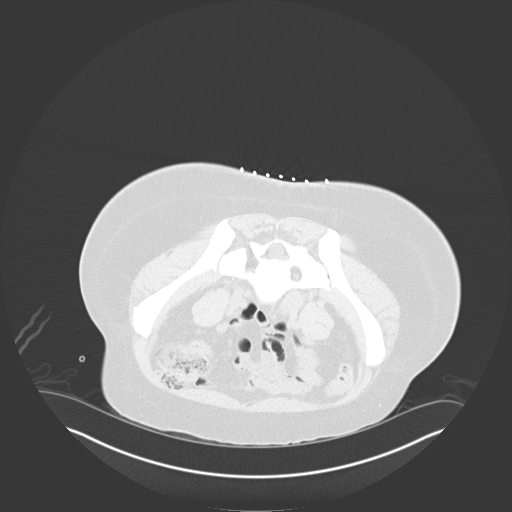
[im 16/25  lung]
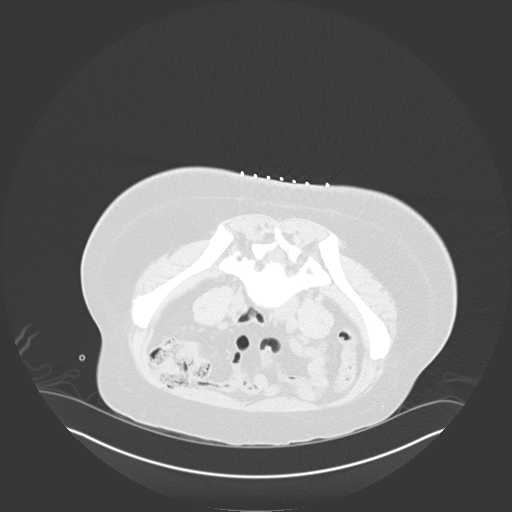
[im 17/25  lung]
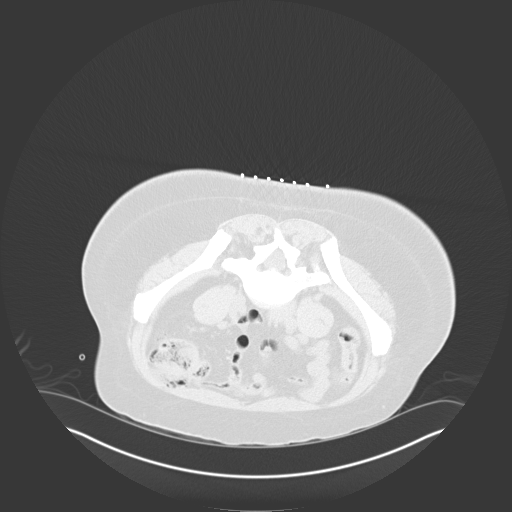
[im 19/25  lung]
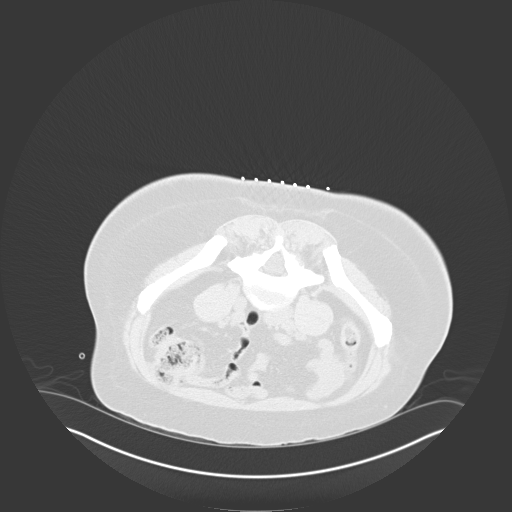
[im 20/25  mediastinal]
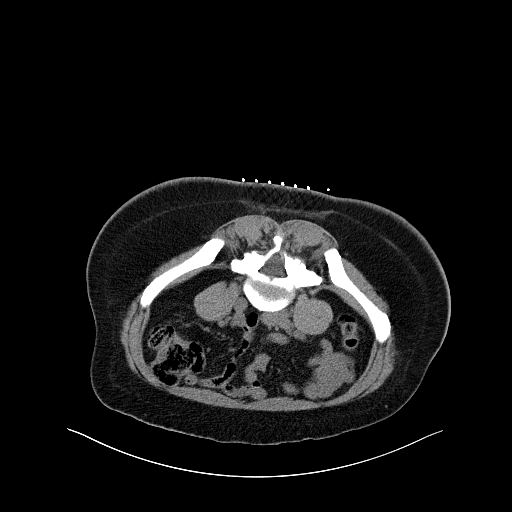
[im 20/25  lung]
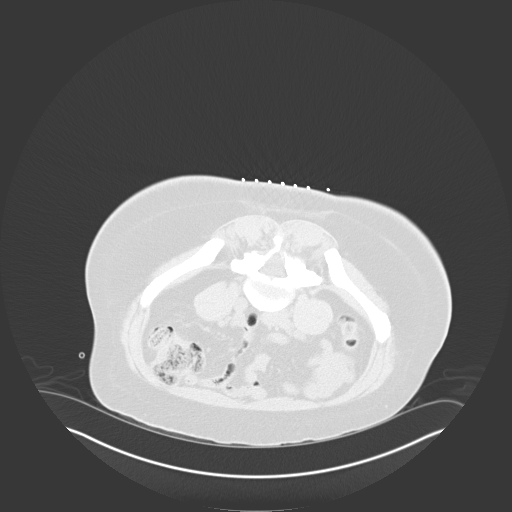
[im 22/25  lung]
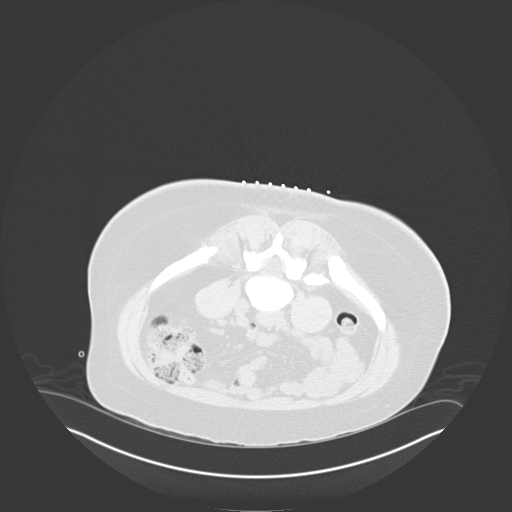
[im 23/25  lung]
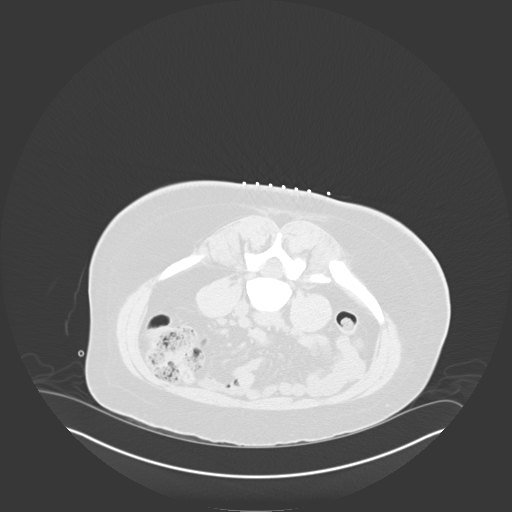

[15 of 29 positions shown; findings below may reference images not displayed]

EXAM:
CT BIOPSY; CT BONE MARROW BIOPSY AND ASPIRATION

MEDICATIONS:
None.

ANESTHESIA/SEDATION:
Moderate (conscious) sedation was employed during this procedure. A
total of Versed 4.0 mg and Fentanyl 100 mcg was administered
intravenously.

Moderate Sedation Time: 15 minutes. The patient's level of
consciousness and vital signs were monitored continuously by
radiology nursing throughout the procedure under my direct
supervision.

FLUOROSCOPY TIME:  CT

COMPLICATIONS:
None

PROCEDURE:
The procedure risks, benefits, and alternatives were explained to
the patient. Questions regarding the procedure were encouraged and
answered. The patient understands and consents to the procedure.

Scout CT of the pelvis was performed for surgical planning purposes.

The posterior pelvis was prepped with Chlorhexidine in a sterile
fashion, and a sterile drape was applied covering the operative
field. A sterile gown and sterile gloves were used for the
procedure. Local anesthesia was provided with 1% Lidocaine.

Right posterior iliac bone was targeted for biopsy. The skin and
subcutaneous tissues were infiltrated with 1% lidocaine without
epinephrine. A small stab incision was made with an 11 blade
scalpel, and an 11 gauge Henkel needle was advanced with CT guidance
to the posterior cortex. Manual forced was used to advance the
needle through the posterior cortex and the stylet was removed. A
bone marrow aspirate was retrieved and passed to a cytotechnologist
in the room. The Henkel needle was then advanced without the stylet
for a core biopsy. The core biopsy was retrieved and also passed to
a cytotechnologist.

Manual pressure was used for hemostasis and a sterile dressing was
placed.

No complications were encountered no significant blood loss was
encountered.

Patient tolerated the procedure well and remained hemodynamically
stable throughout.
IMPRESSION: Status post CT-guided bone marrow biopsy, with tissue specimen sent
to pathology for complete histopathologic analysis

## 2021-05-27 ENCOUNTER — Other Ambulatory Visit: Payer: Self-pay | Admitting: Family Medicine

## 2021-06-26 ENCOUNTER — Inpatient Hospital Stay (HOSPITAL_COMMUNITY): Payer: BC Managed Care – PPO | Attending: Hematology

## 2021-06-26 DIAGNOSIS — I10 Essential (primary) hypertension: Secondary | ICD-10-CM | POA: Insufficient documentation

## 2021-06-26 DIAGNOSIS — Z87891 Personal history of nicotine dependence: Secondary | ICD-10-CM | POA: Insufficient documentation

## 2021-06-26 DIAGNOSIS — M25472 Effusion, left ankle: Secondary | ICD-10-CM | POA: Insufficient documentation

## 2021-06-26 DIAGNOSIS — D649 Anemia, unspecified: Secondary | ICD-10-CM | POA: Insufficient documentation

## 2021-06-26 DIAGNOSIS — Z803 Family history of malignant neoplasm of breast: Secondary | ICD-10-CM | POA: Insufficient documentation

## 2021-06-26 DIAGNOSIS — E669 Obesity, unspecified: Secondary | ICD-10-CM | POA: Insufficient documentation

## 2021-06-26 DIAGNOSIS — Z79899 Other long term (current) drug therapy: Secondary | ICD-10-CM | POA: Insufficient documentation

## 2021-06-26 DIAGNOSIS — C9210 Chronic myeloid leukemia, BCR/ABL-positive, not having achieved remission: Secondary | ICD-10-CM | POA: Insufficient documentation

## 2021-06-27 ENCOUNTER — Other Ambulatory Visit: Payer: Self-pay

## 2021-06-27 ENCOUNTER — Inpatient Hospital Stay (HOSPITAL_COMMUNITY): Payer: BC Managed Care – PPO

## 2021-06-27 DIAGNOSIS — M25472 Effusion, left ankle: Secondary | ICD-10-CM | POA: Diagnosis not present

## 2021-06-27 DIAGNOSIS — I10 Essential (primary) hypertension: Secondary | ICD-10-CM | POA: Diagnosis not present

## 2021-06-27 DIAGNOSIS — C9210 Chronic myeloid leukemia, BCR/ABL-positive, not having achieved remission: Secondary | ICD-10-CM | POA: Diagnosis not present

## 2021-06-27 DIAGNOSIS — Z803 Family history of malignant neoplasm of breast: Secondary | ICD-10-CM | POA: Diagnosis not present

## 2021-06-27 DIAGNOSIS — Z79899 Other long term (current) drug therapy: Secondary | ICD-10-CM | POA: Diagnosis not present

## 2021-06-27 DIAGNOSIS — Z87891 Personal history of nicotine dependence: Secondary | ICD-10-CM | POA: Diagnosis not present

## 2021-06-27 DIAGNOSIS — E669 Obesity, unspecified: Secondary | ICD-10-CM | POA: Diagnosis not present

## 2021-06-27 DIAGNOSIS — D649 Anemia, unspecified: Secondary | ICD-10-CM | POA: Diagnosis not present

## 2021-06-27 LAB — CBC WITH DIFFERENTIAL/PLATELET
Abs Immature Granulocytes: 0.02 10*3/uL (ref 0.00–0.07)
Basophils Absolute: 0 10*3/uL (ref 0.0–0.1)
Basophils Relative: 1 %
Eosinophils Absolute: 0.1 10*3/uL (ref 0.0–0.5)
Eosinophils Relative: 2 %
HCT: 36.4 % (ref 36.0–46.0)
Hemoglobin: 11.8 g/dL — ABNORMAL LOW (ref 12.0–15.0)
Immature Granulocytes: 0 %
Lymphocytes Relative: 22 %
Lymphs Abs: 1.5 10*3/uL (ref 0.7–4.0)
MCH: 31 pg (ref 26.0–34.0)
MCHC: 32.4 g/dL (ref 30.0–36.0)
MCV: 95.5 fL (ref 80.0–100.0)
Monocytes Absolute: 0.4 10*3/uL (ref 0.1–1.0)
Monocytes Relative: 7 %
Neutro Abs: 4.6 10*3/uL (ref 1.7–7.7)
Neutrophils Relative %: 68 %
Platelets: 300 10*3/uL (ref 150–400)
RBC: 3.81 MIL/uL — ABNORMAL LOW (ref 3.87–5.11)
RDW: 12.7 % (ref 11.5–15.5)
WBC: 6.7 10*3/uL (ref 4.0–10.5)
nRBC: 0 % (ref 0.0–0.2)

## 2021-06-27 LAB — COMPREHENSIVE METABOLIC PANEL
ALT: 43 U/L (ref 0–44)
AST: 27 U/L (ref 15–41)
Albumin: 3.9 g/dL (ref 3.5–5.0)
Alkaline Phosphatase: 62 U/L (ref 38–126)
Anion gap: 3 — ABNORMAL LOW (ref 5–15)
BUN: 14 mg/dL (ref 6–20)
CO2: 27 mmol/L (ref 22–32)
Calcium: 8.4 mg/dL — ABNORMAL LOW (ref 8.9–10.3)
Chloride: 106 mmol/L (ref 98–111)
Creatinine, Ser: 1.15 mg/dL — ABNORMAL HIGH (ref 0.44–1.00)
GFR, Estimated: 60 mL/min — ABNORMAL LOW (ref >60)
Glucose, Bld: 94 mg/dL (ref 70–99)
Potassium: 3.6 mmol/L (ref 3.5–5.1)
Sodium: 136 mmol/L (ref 135–145)
Total Bilirubin: 0.6 mg/dL (ref 0.3–1.2)
Total Protein: 6.9 g/dL (ref 6.5–8.1)

## 2021-06-27 LAB — LACTATE DEHYDROGENASE: LDH: 154 U/L (ref 98–192)

## 2021-07-01 NOTE — Progress Notes (Signed)
Marcia Hahn, [place] [address]   CLINIC:  Medical Oncology/Hematology  PCP:  Fayrene Helper, MD [address] [place] / [place] [address] [phone]   REASON FOR VISIT:  Follow-up for CML  PRIOR THERAPY: none  NGS Results: not done  CURRENT THERAPY: Dasatinib 70 mg daily  BRIEF ONCOLOGIC HISTORY:  Oncology History   No history exists.    CANCER STAGING: Cancer Staging No matching staging information was found for the patient.  INTERVAL HISTORY:  Marcia Hahn, a 45 y.o. female, returns for routine follow-up of her CML. Marcia Hahn was last seen on 03/29/21.   Today she reports feeling good. She is taking Sprycel and reports that she is tolerating it well. She has been taking Aldactone for 2 years, and she reports occasional swelling of her left ankle. She denies SOB, itching, and vomiting, and reports occasional nausea.   REVIEW OF SYSTEMS:  Review of Systems  Constitutional:  Negative for appetite change and fatigue.  Respiratory:  Negative for shortness of breath.   Cardiovascular:  Positive for leg swelling (L ankle; occasional).  Gastrointestinal:  Positive for nausea (occasional). Negative for vomiting.  Skin:  Negative for itching.  All other systems reviewed and are negative.  PAST MEDICAL/SURGICAL HISTORY:  Past Medical History:  Diagnosis Date   Abnormal mammogram of right breast 09/03/2018   Acne vulgaris    CML (chronic myelocytic leukemia) ([place])    Hypertension    Mild obesity    Past Surgical History:  Procedure Laterality Date   BREAST BIOPSY Left    CESAREAN SECTION     COLONOSCOPY WITH PROPOFOL N/A 03/17/2021   Procedure: COLONOSCOPY WITH PROPOFOL;  Surgeon: Eloise Harman, DO;  Location: [facility];  Service: Endoscopy;  Laterality: N/A;  ASA II / AM procedure   DILATATION AND CURETTAGE/HYSTEROSCOPY WITH MINERVA N/A 10/26/2020   Procedure: DILATATION AND CURETTAGE/HYSTEROSCOPY WITH  MINERVA ENDOMETRIAL ABLATION;  Surgeon: Florian Buff, MD;  Location: [facility];  Service: Gynecology;  Laterality: N/A;   LAPAROSCOPIC BILATERAL SALPINGECTOMY Bilateral 10/26/2020   Procedure: LAPAROSCOPIC BILATERAL SALPINGECTOMY;  Surgeon: Florian Buff, MD;  Location: [facility];  Service: Gynecology;  Laterality: Bilateral;   POLYPECTOMY  03/17/2021   Procedure: POLYPECTOMY;  Surgeon: Eloise Harman, DO;  Location: [facility];  Service: Endoscopy;;    SOCIAL HISTORY:  Social History   Socioeconomic History   Marital status: Married    Spouse name: Not on file   Number of children: 2   Years of education: Not on file   Highest education level: Not on file  Occupational History   Occupation: Psychologist, educational   Tobacco Use   Smoking status: Former    Types: Cigarettes    Quit date: 12/04/2012    Years since quitting: 8.5   Smokeless tobacco: Never  Vaping Use   Vaping Use: Never used  Substance and Sexual Activity   Alcohol use: Yes    Comment: occasional   Drug use: No   Sexual activity: Not Currently    Birth control/protection: Surgical    Comment: tubal and ablation  Other Topics Concern   Not on file  Social History Narrative   Mother of twins    Social Determinants of Health   Financial Resource Strain: Low Risk    Difficulty of Paying Living Expenses: Not hard at all  Food Insecurity: No Food Insecurity   Worried About Charity fundraiser in  the Last Year: Never true   Ran Out of Food in the Last Year: Never true  Transportation Needs: No Transportation Needs   Lack of Transportation (Medical): No   Lack of Transportation (Non-Medical): No  Physical Activity: Inactive   Days of Exercise per Week: 0 days   Minutes of Exercise per Session: 0 min  Stress: No Stress Concern Present   Feeling of Stress : Not at all  Social Connections: Moderately Integrated   Frequency of Communication with Friends and Family: More than three times a week   Frequency of Social  Gatherings with Friends and Family: Once a week   Attends Religious Services: 1 to 4 times per year   Active Member of [employer] or Organizations: No   Attends Music therapist: Never   Marital Status: Married  Human resources officer Violence: Not At Risk   Fear of Current or Ex-Partner: No   Emotionally Abused: No   Physically Abused: No   Sexually Abused: No    FAMILY HISTORY:  Family History  Problem Relation Age of Onset   GER disease Mother    Diabetes Mother    Hypertension Mother    Heart attack Father 58   Heart disease Father    Migraines Sister    Breast cancer Maternal Grandmother    Stroke Maternal Grandmother    Breast cancer Cousin    Stroke Maternal Aunt    Colon cancer Neg Hx     CURRENT MEDICATIONS:  Current Outpatient Medications  Medication Sig Dispense Refill   loratadine (CLARITIN) 10 MG tablet TAKE 1 TABLET(10 MG) BY MOUTH DAILY 90 tablet 1   losartan (COZAAR) 50 MG tablet Take 1 tablet (50 mg total) by mouth daily. (Patient taking differently: Take 50 mg by mouth at bedtime.) 90 tablet 3   Multiple Vitamins-Minerals (MULTIVITAMIN WITH MINERALS) tablet Take 1 tablet by mouth at bedtime.     spironolactone (ALDACTONE) 25 MG tablet Take 0.5 tablets (12.5 mg total) by mouth daily. (Patient taking differently: Take 12.5 mg by mouth at bedtime.) 45 tablet 3   SPRYCEL 70 MG tablet TAKE 1 TABLET (70 MG TOTAL) BY MOUTH DAILY. (Patient taking differently: Take 70 mg by mouth at bedtime.) 30 tablet 6   No current facility-administered medications for this visit.    ALLERGIES:  Allergies  Allergen Reactions   Amlodipine     Headache    Tomato Itching and Swelling    Processed tomatoes in the can.     PHYSICAL EXAM:  Performance status (ECOG): 1 - Symptomatic but completely ambulatory  There were no vitals filed for this visit. Wt Readings from Last 3 Encounters:  03/29/21 208 lb 6.4 oz (94.5 kg)  03/17/21 207 lb (93.9 kg)  01/24/21 212 lb 12.8 oz  (96.5 kg)   Physical Exam Vitals reviewed.  Constitutional:      Appearance: Normal appearance.  Cardiovascular:     Rate and Rhythm: Normal rate and regular rhythm.     Pulses: Normal pulses.     Heart sounds: Normal heart sounds.  Pulmonary:     Effort: Pulmonary effort is normal.     Breath sounds: Normal breath sounds.  Abdominal:     Palpations: Abdomen is soft. There is no hepatomegaly, splenomegaly or mass.     Tenderness: There is no abdominal tenderness.  Musculoskeletal:     Right lower leg: No edema.     Left lower leg: No edema.  Neurological:     General: No  focal deficit present.     Mental Status: She is alert and oriented to person, place, and time.  Psychiatric:        Mood and Affect: Mood normal.        Behavior: Behavior normal.     LABORATORY DATA:  I have reviewed the labs as listed.  CBC Latest Ref Rng & Units 06/27/2021 03/22/2021 11/21/2020  WBC 4.0 - 10.5 K/uL 6.7 5.8 6.5  Hemoglobin 12.0 - 15.0 g/dL 11.8(L) 12.4 11.8(L)  Hematocrit 36.0 - 46.0 % 36.4 38.1 37.4  Platelets 150 - 400 K/uL 300 256 290   CMP Latest Ref Rng & Units 06/27/2021 03/22/2021 03/15/2021  Glucose 70 - 99 mg/dL 94 97 88  BUN 6 - 20 mg/dL '14 16 19  ' Creatinine 0.44 - 1.00 mg/dL 1.15(H) 0.86 1.01(H)  Sodium 135 - 145 mmol/L 136 136 136  Potassium 3.5 - 5.1 mmol/L 3.6 3.5 3.5  Chloride 98 - 111 mmol/L 106 104 105  CO2 22 - 32 mmol/L '27 25 25  ' Calcium 8.9 - 10.3 mg/dL 8.4(L) 8.6(L) 8.3(L)  Total Protein 6.5 - 8.1 g/dL 6.9 7.1 -  Total Bilirubin 0.3 - 1.2 mg/dL 0.6 0.4 -  Alkaline Phos 38 - 126 U/L 62 46 -  AST 15 - 41 U/L 27 22 -  ALT 0 - 44 U/L 43 21 -    DIAGNOSTIC IMAGING:  I have independently reviewed the scans and discussed with the patient. No results found.   ASSESSMENT:  1.  CML in chronic phase: -Presentation with hyperleukocytosis.  BCR/ABL by FISH positive. -CT chest PE protocol showed splenomegaly measuring 14 cm. -BMBX on 02/23/2020 shows hypercellular marrow  with myeloid hyperplasia consistent with CML.  No increase in blasts.  Flow cytometry less than 1% blasts.  Chromosome analysis shows Philadelphia chromosome. -Dasatinib 70 mg daily started on 04/01/2020. -BCR/ABL by quantitative PCR on 06/30/2020 shows B3 A2 transcript improved to 2.19 from 128 previously.  B2 A2 and E1 A2 transcripts are undetectable. -BCR/ABL by quantitative PCR on 11/21/2020 -.    PLAN:  1.  CML in chronic phase: - She is tolerating Dasatinib without any major problems. - BCR/ABL from 03/22/2021 was negative the second time. - BCR/ABL from 06/27/2021 is pending.  CBC showed white count 6.7 and platelet count 300.  Differential was normal. - Creatinine is mildly elevated at 1.15 likely from diuretic. - Physical examination today did not reveal any palpable splenomegaly.  She will continue Dasatinib 70 mg daily. - We will follow-up on BCR/ABL from last week.  She will be seen back in the clinic in 3 months with repeat labs.   2.  Hypertension: - Continue losartan and spironolactone daily.   3.  Normocytic anemia: - She is no longer taking iron tablet.  Hemoglobin slightly decreased 11.8 with hematocrit 36.4.  MCV was normal.  Likely myelosuppression from Dasatinib.   Orders placed this encounter:  No orders of the defined types were placed in this encounter.    Derek Jack, MD [place] [phone]   I, Thana Ates, am acting as a scribe for Dr. Derek Jack.  I, Derek Jack MD, have reviewed the above documentation for accuracy and completeness, and I agree with the above.

## 2021-07-03 ENCOUNTER — Inpatient Hospital Stay (HOSPITAL_BASED_OUTPATIENT_CLINIC_OR_DEPARTMENT_OTHER): Payer: BC Managed Care – PPO | Admitting: Hematology

## 2021-07-03 ENCOUNTER — Other Ambulatory Visit: Payer: Self-pay

## 2021-07-03 VITALS — BP 155/75 | HR 64 | Temp 98.1°F | Resp 16 | Wt 213.4 lb

## 2021-07-03 DIAGNOSIS — M25472 Effusion, left ankle: Secondary | ICD-10-CM | POA: Diagnosis not present

## 2021-07-03 DIAGNOSIS — I10 Essential (primary) hypertension: Secondary | ICD-10-CM | POA: Diagnosis not present

## 2021-07-03 DIAGNOSIS — Z79899 Other long term (current) drug therapy: Secondary | ICD-10-CM | POA: Diagnosis not present

## 2021-07-03 DIAGNOSIS — C9210 Chronic myeloid leukemia, BCR/ABL-positive, not having achieved remission: Secondary | ICD-10-CM | POA: Diagnosis not present

## 2021-07-03 DIAGNOSIS — Z803 Family history of malignant neoplasm of breast: Secondary | ICD-10-CM | POA: Diagnosis not present

## 2021-07-03 DIAGNOSIS — D649 Anemia, unspecified: Secondary | ICD-10-CM | POA: Diagnosis not present

## 2021-07-03 DIAGNOSIS — Z87891 Personal history of nicotine dependence: Secondary | ICD-10-CM | POA: Diagnosis not present

## 2021-07-03 DIAGNOSIS — E669 Obesity, unspecified: Secondary | ICD-10-CM | POA: Diagnosis not present

## 2021-07-03 NOTE — Patient Instructions (Signed)
[facility] at [facility] Discharge Instructions  You were seen today by Dr. Katragadda. He went over your recent results. Dr. Katragadda will see you back in 3 months for labs and follow up.   Thank you for choosing [facility] at [facility] to provide your oncology and hematology care.  To afford each patient quality time with our provider, please arrive at least 15 minutes before your scheduled appointment time.   If you have a lab appointment with the Cancer Center please come in thru the Main Entrance and check in at the main information desk  You need to re-schedule your appointment should you arrive 10 or more minutes late.  We strive to give you quality time with our providers, and arriving late affects you and other patients whose appointments are after yours.  Also, if you no show three or more times for appointments you may be dismissed from the clinic at the providers discretion.     Again, thank you for choosing [facility].  Our hope is that these requests will decrease the amount of time that you wait before being seen by our physicians.       _____________________________________________________________  Should you have questions after your visit to  Cancer Center, please contact our office at [phone] between the hours of 8:00 a.m. and 4:30 p.m.  Voicemails left after 4:00 p.m. will not be returned until the following business day.  For prescription refill requests, have your pharmacy contact our office and allow 72 hours.    Cancer Center Support Programs:   > Cancer Support Group  2nd Tuesday of the month 1pm-2pm, Journey Room   

## 2021-07-04 LAB — BCR-ABL1, CML/ALL, PCR, QUANT: Interpretation (BCRAL):: NEGATIVE

## 2021-07-10 ENCOUNTER — Ambulatory Visit: Payer: BC Managed Care – PPO | Admitting: Family Medicine

## 2021-07-11 ENCOUNTER — Ambulatory Visit: Payer: BC Managed Care – PPO | Admitting: Gastroenterology

## 2021-07-21 ENCOUNTER — Other Ambulatory Visit (HOSPITAL_COMMUNITY): Payer: Self-pay | Admitting: Hematology

## 2021-07-21 DIAGNOSIS — C9210 Chronic myeloid leukemia, BCR/ABL-positive, not having achieved remission: Secondary | ICD-10-CM

## 2021-07-24 ENCOUNTER — Other Ambulatory Visit (HOSPITAL_COMMUNITY): Payer: Self-pay | Admitting: *Deleted

## 2021-07-25 ENCOUNTER — Ambulatory Visit: Payer: BC Managed Care – PPO | Admitting: Family Medicine

## 2021-07-25 ENCOUNTER — Encounter: Payer: Self-pay | Admitting: Family Medicine

## 2021-07-25 ENCOUNTER — Other Ambulatory Visit: Payer: Self-pay

## 2021-07-25 VITALS — BP 140/82 | HR 72 | Temp 99.3°F | Resp 18 | Ht 66.0 in | Wt 211.0 lb

## 2021-07-25 DIAGNOSIS — I10 Essential (primary) hypertension: Secondary | ICD-10-CM | POA: Diagnosis not present

## 2021-07-25 DIAGNOSIS — C9210 Chronic myeloid leukemia, BCR/ABL-positive, not having achieved remission: Secondary | ICD-10-CM

## 2021-07-25 DIAGNOSIS — E669 Obesity, unspecified: Secondary | ICD-10-CM | POA: Diagnosis not present

## 2021-07-25 DIAGNOSIS — Z1231 Encounter for screening mammogram for malignant neoplasm of breast: Secondary | ICD-10-CM

## 2021-07-25 DIAGNOSIS — E785 Hyperlipidemia, unspecified: Secondary | ICD-10-CM | POA: Diagnosis not present

## 2021-07-25 DIAGNOSIS — E66811 Obesity, class 1: Secondary | ICD-10-CM

## 2021-07-25 MED ORDER — SPIRONOLACTONE 25 MG PO TABS: 25.0000 mg | ORAL_TABLET | Freq: Every day | ORAL | 2 refills | Status: DC

## 2021-07-25 MED ORDER — LOSARTAN POTASSIUM 100 MG PO TABS: 100.0000 mg | ORAL_TABLET | Freq: Every day | ORAL | 3 refills | Status: DC

## 2021-07-25 NOTE — Patient Instructions (Signed)
Annual exam  with pap in 4  to 5 weeks , call if you need me sooner. Flu vaccine at next visit   Increase spironolactone to ONE tablet daily, losartan as before  Fasting labs ordered in May, please get these in the week before next visit   Please schedule December  mammogram at checkout  It is important that you exercise regularly at least 30 minutes 5 times a week. If you develop chest pain, have severe difficulty breathing, or feel very tired, stop exercising immediately and seek medical attention  Think about what you will eat, plan ahead. Choose " clean, green, fresh or frozen" over canned, processed or packaged foods which are more sugary, salty and fatty. 70 to 75% of food eaten should be vegetables and fruit. Three meals at set times with snacks allowed between meals, but they must be fruit or vegetables. Aim to eat over a 12 hour period , example 7 am to 7 pm, and STOP after  your last meal of the day. Drink water,generally about 64 ounces per day, no other drink is as healthy. Fruit juice is best enjoyed in a healthy way, by EATING the fruit.  Thanks for choosing [facility], we consider it a privelige to serve you.

## 2021-07-26 ENCOUNTER — Encounter: Payer: Self-pay | Admitting: Family Medicine

## 2021-07-26 NOTE — Progress Notes (Signed)
Marcia Hahn     MRN: [id number]      DOB: 1976-06-18   HPI Marcia Hahn is here for follow up and re-evaluation of chronic medical conditions, medication management and review of any available recent lab and radiology data.  Preventive health is updated, specifically  Cancer screening and Immunization.   Questions or concerns regarding consultations or procedures which the PT has had in the interim are  addressed. The PT denies any adverse reactions to current medications since the last visit.  There are no new concerns.  There are no specific complaints   ROS Denies recent fever or chills. Denies sinus pressure, nasal congestion, ear pain or sore throat. Denies chest congestion, productive cough or wheezing. Denies chest pains, palpitations and leg swelling Denies abdominal pain, nausea, vomiting,diarrhea or constipation.   Denies dysuria, frequency, hesitancy or incontinence. Denies joint pain, swelling and limitation in mobility. Denies headaches, seizures, numbness, or tingling. Denies depression, anxiety or insomnia. Denies skin break down or rash.   PE  BP 140/82 (BP Location: Right Arm, Patient Position: Sitting, Cuff Size: Large)   Pulse 72   Temp 99.3 F (37.4 C)   Resp 18   Ht '5\' 6"'$  (1.676 m)   Wt 211 lb (95.7 kg)   SpO2 98%   BMI 34.06 kg/m   Patient alert and oriented and in no cardiopulmonary distress.  HEENT: No facial asymmetry, EOMI,     Neck supple .  Chest: Clear to auscultation bilaterally.  CVS: S1, S2 no murmurs, no S3.Regular rate.  ABD: Soft non tender.   Ext: No edema  MS: Adequate ROM spine, shoulders, hips and knees.  Skin: Intact, no ulcerations or rash noted.  Psych: Good eye contact, normal affect. Memory intact not anxious or depressed appearing.  CNS: CN 2-12 intact, power,  normal throughout.no focal deficits noted.   Assessment & Plan  Benign essential HTN Uncontrolled, increase dose of spironolacone DASH diet and  commitment to daily physical activity for a minimum of 30 minutes discussed and encouraged, as a part of hypertension management. The importance of attaining a healthy weight is also discussed.  BP/Weight 07/25/2021 07/03/2021 03/29/2021 03/17/2021 01/24/2021 [id number]  Systolic BP [id number] 123456 [id number] - - [id number]  Diastolic BP 82 75 87 91 - - 80  Wt. (Lbs) 211 213.41 208.4 207 212.8 208 213.6  BMI 34.06 34.44 33.64 33.41 34.35 33.57 34.48       CML (chronic myelocytic leukemia) ([facility]) Managed by oncology  Obesity (BMI 30.0-34.9)  Patient re-educated about  the importance of commitment to a  minimum of 150 minutes of exercise per week as able.  The importance of healthy food choices with portion control discussed, as well as eating regularly and within a 12 hour window most days. The need to choose "clean , green" food 50 to 75% of the time is discussed, as well as to make water the primary drink and set a goal of 64 ounces water daily.    Weight /BMI 07/25/2021 07/03/2021 03/29/2021  WEIGHT 211 lb 213 lb 6.5 oz 208 lb 6.4 oz  HEIGHT '5\' 6"'$  - -  BMI 34.06 kg/m2 34.44 kg/m2 33.64 kg/m2      Prediabetes       Dyslipidemia (high LDL; low HDL) Hyperlipidemia:Low fat diet discussed and encouraged.   Lipid Panel  Lab Results  Component Value Date   CHOL 147 06/30/2020   HDL 39 (L) 06/30/2020   LDLCALC 90 06/30/2020   TRIG  90 06/30/2020   CHOLHDL 3.8 06/30/2020     Updated lab needed at/ before next visit.

## 2021-07-26 NOTE — Assessment & Plan Note (Signed)
Uncontrolled, increase dose of spironolacone DASH diet and commitment to daily physical activity for a minimum of 30 minutes discussed and encouraged, as a part of hypertension management. The importance of attaining a healthy weight is also discussed.  BP/Weight 07/25/2021 07/03/2021 03/29/2021 03/17/2021 01/24/2021 [id number]  Systolic BP [id number] 123456 [id number] - - [id number]  Diastolic BP 82 75 87 91 - - 80  Wt. (Lbs) 211 213.41 208.4 207 212.8 208 213.6  BMI 34.06 34.44 33.64 33.41 34.35 33.57 34.48

## 2021-07-26 NOTE — Assessment & Plan Note (Signed)
Hyperlipidemia:Low fat diet discussed and encouraged.   Lipid Panel  Lab Results  Component Value Date   CHOL 147 06/30/2020   HDL 39 (L) 06/30/2020   LDLCALC 90 06/30/2020   TRIG 90 06/30/2020   CHOLHDL 3.8 06/30/2020     Updated lab needed at/ before next visit.

## 2021-07-26 NOTE — Assessment & Plan Note (Signed)
  Patient re-educated about  the importance of commitment to a  minimum of 150 minutes of exercise per week as able.  The importance of healthy food choices with portion control discussed, as well as eating regularly and within a 12 hour window most days. The need to choose "clean , green" food 50 to 75% of the time is discussed, as well as to make water the primary drink and set a goal of 64 ounces water daily.    Weight /BMI 07/25/2021 07/03/2021 03/29/2021  WEIGHT 211 lb 213 lb 6.5 oz 208 lb 6.4 oz  HEIGHT '5\' 6"'$  - -  BMI 34.06 kg/m2 34.44 kg/m2 33.64 kg/m2

## 2021-07-26 NOTE — Assessment & Plan Note (Signed)
Managed by oncology

## 2021-08-30 ENCOUNTER — Ambulatory Visit: Payer: BC Managed Care – PPO | Admitting: Internal Medicine

## 2021-09-01 ENCOUNTER — Encounter: Payer: BC Managed Care – PPO | Admitting: Family Medicine

## 2021-09-06 ENCOUNTER — Ambulatory Visit: Payer: BC Managed Care – PPO | Admitting: Internal Medicine

## 2021-09-11 ENCOUNTER — Encounter: Payer: Self-pay | Admitting: *Deleted

## 2021-10-04 ENCOUNTER — Encounter: Payer: Self-pay | Admitting: Internal Medicine

## 2021-10-09 ENCOUNTER — Inpatient Hospital Stay (HOSPITAL_COMMUNITY): Payer: BC Managed Care – PPO | Attending: Hematology

## 2021-10-09 DIAGNOSIS — C9210 Chronic myeloid leukemia, BCR/ABL-positive, not having achieved remission: Secondary | ICD-10-CM | POA: Diagnosis not present

## 2021-10-09 LAB — CBC WITH DIFFERENTIAL/PLATELET
Abs Immature Granulocytes: 0.02 10*3/uL (ref 0.00–0.07)
Basophils Absolute: 0 10*3/uL (ref 0.0–0.1)
Basophils Relative: 0 %
Eosinophils Absolute: 0.1 10*3/uL (ref 0.0–0.5)
Eosinophils Relative: 2 %
HCT: 35.7 % — ABNORMAL LOW (ref 36.0–46.0)
Hemoglobin: 11.8 g/dL — ABNORMAL LOW (ref 12.0–15.0)
Immature Granulocytes: 0 %
Lymphocytes Relative: 26 %
Lymphs Abs: 1.7 10*3/uL (ref 0.7–4.0)
MCH: 31.4 pg (ref 26.0–34.0)
MCHC: 33.1 g/dL (ref 30.0–36.0)
MCV: 94.9 fL (ref 80.0–100.0)
Monocytes Absolute: 0.4 10*3/uL (ref 0.1–1.0)
Monocytes Relative: 5 %
Neutro Abs: 4.5 10*3/uL (ref 1.7–7.7)
Neutrophils Relative %: 67 %
Platelets: 261 10*3/uL (ref 150–400)
RBC: 3.76 MIL/uL — ABNORMAL LOW (ref 3.87–5.11)
RDW: 12.3 % (ref 11.5–15.5)
WBC: 6.8 10*3/uL (ref 4.0–10.5)
nRBC: 0 % (ref 0.0–0.2)

## 2021-10-09 LAB — COMPREHENSIVE METABOLIC PANEL
ALT: 41 U/L (ref 0–44)
AST: 36 U/L (ref 15–41)
Albumin: 4 g/dL (ref 3.5–5.0)
Alkaline Phosphatase: 54 U/L (ref 38–126)
Anion gap: 7 (ref 5–15)
BUN: 17 mg/dL (ref 6–20)
CO2: 26 mmol/L (ref 22–32)
Calcium: 8.5 mg/dL — ABNORMAL LOW (ref 8.9–10.3)
Chloride: 106 mmol/L (ref 98–111)
Creatinine, Ser: 1.04 mg/dL — ABNORMAL HIGH (ref 0.44–1.00)
GFR, Estimated: >60 mL/min (ref >60)
Glucose, Bld: 84 mg/dL (ref 70–99)
Potassium: 3.5 mmol/L (ref 3.5–5.1)
Sodium: 139 mmol/L (ref 135–145)
Total Bilirubin: 0.2 mg/dL — ABNORMAL LOW (ref 0.3–1.2)
Total Protein: 6.8 g/dL (ref 6.5–8.1)

## 2021-10-09 LAB — LACTATE DEHYDROGENASE: LDH: 159 U/L (ref 98–192)

## 2021-10-15 NOTE — Progress Notes (Signed)
Marcia Hahn, Marcia Hahn [id number]   CLINIC:  Medical Oncology/Hematology  PCP:  Fayrene Helper, MD [address], [place] / [place] [address]  [phone]  REASON FOR VISIT:  Follow-up for CML  PRIOR THERAPY: none  CURRENT THERAPY: Dasatinib 70 mg daily  INTERVAL HISTORY:  Marcia Hahn, a 45 y.o. female, returns for routine follow-up for her CML. Marcia Hahn was last seen on 07/03/2021.  Today she reports feeling good. She reports occasional nausea, and she denies vomiting, leg swellings, and denies fluid retention. She monitors her blood pressure at home.   REVIEW OF SYSTEMS:  Review of Systems  Constitutional:  Negative for appetite change and fatigue.  Cardiovascular:  Negative for leg swelling.  Gastrointestinal:  Positive for nausea (occasional). Negative for vomiting.  All other systems reviewed and are negative.  PAST MEDICAL/SURGICAL HISTORY:  Past Medical History:  Diagnosis Date   Abnormal mammogram of right breast 09/03/2018   Acne vulgaris    CML (chronic myelocytic leukemia) ([place])    Hypertension    Mild obesity    Past Surgical History:  Procedure Laterality Date   BREAST BIOPSY Left    CESAREAN SECTION     COLONOSCOPY WITH PROPOFOL N/A 03/17/2021   Procedure: COLONOSCOPY WITH PROPOFOL;  Surgeon: Eloise Harman, DO;  [facility];  Service: Endoscopy;  Laterality: N/A;  ASA II / AM procedure   DILATATION AND CURETTAGE/HYSTEROSCOPY WITH MINERVA N/A 10/26/2020   Procedure: DILATATION AND CURETTAGE/HYSTEROSCOPY WITH MINERVA ENDOMETRIAL ABLATION;  Surgeon: Florian Buff, MD;  Location: [facility];  Service: Gynecology;  Laterality: N/A;   LAPAROSCOPIC BILATERAL SALPINGECTOMY Bilateral 10/26/2020   Procedure: LAPAROSCOPIC BILATERAL SALPINGECTOMY;  Surgeon: Florian Buff, MD;  Location: [facility];  Service: Gynecology;  Laterality: Bilateral;   POLYPECTOMY  03/17/2021   Procedure: POLYPECTOMY;  Surgeon:  Eloise Harman, DO;  [facility];  Service: Endoscopy;;    SOCIAL HISTORY:  Social History   Socioeconomic History   Marital status: Married    Spouse name: Not on file   Number of children: 2   Years of education: Not on file   Highest education level: Not on file  Occupational History   Occupation: Psychologist, educational   Tobacco Use   Smoking status: Former    Types: Cigarettes    Quit date: 12/04/2012    Years since quitting: 8.8   Smokeless tobacco: Never  Vaping Use   Vaping Use: Never used  Substance and Sexual Activity   Alcohol use: Yes    Comment: occasional   Drug use: No   Sexual activity: Not Currently    Birth control/protection: Surgical    Comment: tubal and ablation  Other Topics Concern   Not on file  Social History Narrative   Mother of twins    Social Determinants of Health   Financial Resource Strain: Not on file  Food Insecurity: Not on file  Transportation Needs: Not on file  Physical Activity: Not on file  Stress: Not on file  Social Connections: Not on file  Intimate Partner Violence: Not on file    FAMILY HISTORY:  Family History  Problem Relation Age of Onset   GER disease Mother    Diabetes Mother    Hypertension Mother    Heart attack Father 29   Heart disease Father    Migraines Sister    Breast cancer Maternal Grandmother    Stroke Maternal Grandmother  Breast cancer Cousin    Stroke Maternal Aunt    Colon cancer Neg Hx     CURRENT MEDICATIONS:  Current Outpatient Medications  Medication Sig Dispense Refill   loratadine (CLARITIN) 10 MG tablet TAKE 1 TABLET(10 MG) BY MOUTH DAILY 90 tablet 1   losartan (COZAAR) 100 MG tablet Take 1 tablet (100 mg total) by mouth daily. 90 tablet 3   Multiple Vitamins-Minerals (MULTIVITAMIN WITH MINERALS) tablet Take 1 tablet by mouth at bedtime.     spironolactone (ALDACTONE) 25 MG tablet Take 1 tablet (25 mg total) by mouth daily. 30 tablet 2   SPRYCEL 70 MG tablet TAKE 1  TABLET (70 MG TOTAL) BY MOUTH DAILY. 30 tablet 6   No current facility-administered medications for this visit.    ALLERGIES:  Allergies  Allergen Reactions   Amlodipine     Headache    Tomato Itching and Swelling    Processed tomatoes in the can.     PHYSICAL EXAM:  Performance status (ECOG): 1 - Symptomatic but completely ambulatory  Vitals:   10/16/21 1521 10/16/21 1525  BP:  (!) 171/95  Pulse: 64   Resp: 18   Temp: 98.1 F (36.7 C)   SpO2: 100%    Wt Readings from Last 3 Encounters:  10/16/21 214 lb (97.1 kg)  07/25/21 211 lb (95.7 kg)  07/03/21 213 lb 6.5 oz (96.8 kg)   Physical Exam Vitals reviewed.  Constitutional:      Appearance: Normal appearance. She is obese.  Cardiovascular:     Rate and Rhythm: Normal rate and regular rhythm.     Pulses: Normal pulses.     Heart sounds: Normal heart sounds.  Pulmonary:     Effort: Pulmonary effort is normal.     Breath sounds: Normal breath sounds.  Abdominal:     Palpations: Abdomen is soft. There is no hepatomegaly, splenomegaly or mass.     Tenderness: There is no abdominal tenderness.  Musculoskeletal:     Right lower leg: No edema.     Left lower leg: No edema.  Neurological:     General: No focal deficit present.     Mental Status: She is alert and oriented to person, place, and time.  Psychiatric:        Mood and Affect: Mood normal.        Behavior: Behavior normal.    LABORATORY DATA:  I have reviewed the labs as listed.  CBC Latest Ref Rng & Units 10/09/2021 06/27/2021 03/22/2021  WBC 4.0 - 10.5 K/uL 6.8 6.7 5.8  Hemoglobin 12.0 - 15.0 g/dL 11.8(L) 11.8(L) 12.4  Hematocrit 36.0 - 46.0 % 35.7(L) 36.4 38.1  Platelets 150 - 400 K/uL 261 300 256   CMP Latest Ref Rng & Units 10/09/2021 06/27/2021 03/22/2021  Glucose 70 - 99 mg/dL 84 94 97  BUN 6 - 20 mg/dL '17 14 16  ' Creatinine 0.44 - 1.00 mg/dL 1.04(H) 1.15(H) 0.86  Sodium 135 - 145 mmol/L 139 136 136  Potassium 3.5 - 5.1 mmol/L 3.5 3.6 3.5   Chloride 98 - 111 mmol/L 106 106 104  CO2 22 - 32 mmol/L '26 27 25  ' Calcium 8.9 - 10.3 mg/dL 8.5(L) 8.4(L) 8.6(L)  Total Protein 6.5 - 8.1 g/dL 6.8 6.9 7.1  Total Bilirubin 0.3 - 1.2 mg/dL 0.2(L) 0.6 0.4  Alkaline Phos 38 - 126 U/L 54 62 46  AST 15 - 41 U/L 36 27 22  ALT 0 - 44 U/L 41 43 21  Component Value Date/Time   RBC 3.76 (L) 10/09/2021 1448   MCV 94.9 10/09/2021 1448   MCH 31.4 10/09/2021 1448   MCHC 33.1 10/09/2021 1448   RDW 12.3 10/09/2021 1448   LYMPHSABS 1.7 10/09/2021 1448   MONOABS 0.4 10/09/2021 1448   EOSABS 0.1 10/09/2021 1448   BASOSABS 0.0 10/09/2021 1448    DIAGNOSTIC IMAGING:  I have independently reviewed the scans and discussed with the patient. No results found.   ASSESSMENT:  1.  CML in chronic phase: -Presentation with hyperleukocytosis.  BCR/ABL by FISH positive. -CT chest PE protocol showed splenomegaly measuring 14 cm. -BMBX on 02/23/2020 shows hypercellular marrow with myeloid hyperplasia consistent with CML.  No increase in blasts.  Flow cytometry less than 1% blasts.  Chromosome analysis shows Philadelphia chromosome. -Dasatinib 70 mg daily started on 04/01/2020. -BCR/ABL by quantitative PCR on 06/30/2020 shows B3 A2 transcript improved to 2.19 from 128 previously.  B2 A2 and E1 A2 transcripts are undetectable. -BCR/ABL by quantitative PCR on 11/21/2020 -. - BCR/ABL from 03/22/2021 was negative. - BCR/ABL on 06/27/2021 was undetectable.   PLAN:  1.  CML in chronic phase: - She is tolerating Dasatinib without any major problems. - Physical examination today did not reveal any fluid overload.  No splenomegaly. - Reviewed labs from 10/09/2021 which showed white count is 6.8 with normal differential.  Platelet count was normal.  BCR/ABL by PCR is pending.  BCR/ABL by PCR from 06/27/2021 was undetectable. - She has occasional nausea but denied any vomiting.  She is not requiring any nausea medication. - Continue Dasatinib 70 mg daily. - RTC 3  months for follow-up with repeat labs.   2.  Hypertension: - Continue losartan and spironolactone daily.  She is slightly anxious today and her blood pressure is elevated.  She reports that her blood pressure was 137/90 at work.   3.  Normocytic anemia: - Hemoglobin is stable around 11.8.  She is no longer taking iron tablet.  Orders placed this encounter:  No orders of the defined types were placed in this encounter.    Derek Jack, MD [place] [phone]   I, Thana Ates, am acting as a scribe for Dr. Derek Jack.  I, Derek Jack MD, have reviewed the above documentation for accuracy and completeness, and I agree with the above.

## 2021-10-16 ENCOUNTER — Encounter (HOSPITAL_COMMUNITY): Payer: Self-pay | Admitting: Hematology

## 2021-10-16 ENCOUNTER — Inpatient Hospital Stay (HOSPITAL_COMMUNITY): Payer: BC Managed Care – PPO | Attending: Hematology | Admitting: Hematology

## 2021-10-16 ENCOUNTER — Other Ambulatory Visit: Payer: Self-pay

## 2021-10-16 VITALS — BP 171/95 | HR 64 | Temp 98.1°F | Resp 18 | Ht 65.0 in | Wt 214.0 lb

## 2021-10-16 DIAGNOSIS — E669 Obesity, unspecified: Secondary | ICD-10-CM | POA: Diagnosis not present

## 2021-10-16 DIAGNOSIS — Z79899 Other long term (current) drug therapy: Secondary | ICD-10-CM | POA: Insufficient documentation

## 2021-10-16 DIAGNOSIS — R110 Nausea: Secondary | ICD-10-CM | POA: Diagnosis not present

## 2021-10-16 DIAGNOSIS — C9210 Chronic myeloid leukemia, BCR/ABL-positive, not having achieved remission: Secondary | ICD-10-CM | POA: Diagnosis not present

## 2021-10-16 DIAGNOSIS — I10 Essential (primary) hypertension: Secondary | ICD-10-CM | POA: Insufficient documentation

## 2021-10-16 DIAGNOSIS — Z803 Family history of malignant neoplasm of breast: Secondary | ICD-10-CM | POA: Diagnosis not present

## 2021-10-16 DIAGNOSIS — D509 Iron deficiency anemia, unspecified: Secondary | ICD-10-CM | POA: Insufficient documentation

## 2021-10-16 NOTE — Patient Instructions (Signed)
[place] at [facility] Discharge Instructions  You were seen and examined today by Dr. Delton Coombes. He reviewed your most recent labs and everything looks good. Continue taking Sprycel as prescribed. If you have any shortness of breath or ankle swelling please let us know immediately. Please keep follow up appointment as scheduled in 3 months.   Thank you for choosing [place] at [facility] to provide your oncology and hematology care.  To afford each patient quality time with our provider, please arrive at least 15 minutes before your scheduled appointment time.   If you have a lab appointment with the [facility] please come in thru the Main Entrance and check in at the main information desk.  You need to re-schedule your appointment should you arrive 10 or more minutes late.  We strive to give you quality time with our providers, and arriving late affects you and other patients whose appointments are after yours.  Also, if you no show three or more times for appointments you may be dismissed from the clinic at the providers discretion.     Again, thank you for choosing [facility].  Our hope is that these requests will decrease the amount of time that you wait before being seen by our physicians.       _____________________________________________________________  Should you have questions after your visit to [facility], please contact our office at [phone] and follow the prompts.  Our office hours are 8:00 a.m. and 4:30 p.m. Monday - Friday.  Please note that voicemails left after 4:00 p.m. may not be returned until the following business day.  We are closed weekends and major holidays.  You do have access to a nurse 24-7, just call the main number to the clinic [phone] and do not press any options, hold on the line and a nurse will answer the phone.    For prescription refill requests, have your pharmacy  contact our office and allow 72 hours.    Due to Covid, you will need to wear a mask upon entering the hospital. If you do not have a mask, a mask will be given to you at the Main Entrance upon arrival. For doctor visits, patients may have 1 support person age 12 or older with them. For treatment visits, patients can not have anyone with them due to social distancing guidelines and our immunocompromised population.

## 2021-10-17 LAB — BCR-ABL1, CML/ALL, PCR, QUANT
Interpretation (BCRAL):: POSITIVE
b2a2 transcript: 0.1561 %
b3a2 transcript: 13.1258 %

## 2021-10-27 ENCOUNTER — Other Ambulatory Visit: Payer: Self-pay | Admitting: Family Medicine

## 2021-11-02 ENCOUNTER — Ambulatory Visit (HOSPITAL_COMMUNITY): Payer: BC Managed Care – PPO

## 2021-11-09 ENCOUNTER — Encounter: Payer: Self-pay | Admitting: Internal Medicine

## 2021-11-17 ENCOUNTER — Ambulatory Visit (HOSPITAL_COMMUNITY)
Admission: RE | Admit: 2021-11-17 | Discharge: 2021-11-17 | Disposition: A | Discharge status: Home / Self Care | Payer: BC Managed Care – PPO | Source: Ambulatory Visit | Attending: Family Medicine | Admitting: Family Medicine

## 2021-11-17 ENCOUNTER — Other Ambulatory Visit: Payer: Self-pay

## 2021-11-17 DIAGNOSIS — Z1231 Encounter for screening mammogram for malignant neoplasm of breast: Secondary | ICD-10-CM | POA: Diagnosis not present

## 2021-11-21 DIAGNOSIS — E785 Hyperlipidemia, unspecified: Secondary | ICD-10-CM | POA: Diagnosis not present

## 2021-11-21 DIAGNOSIS — E559 Vitamin D deficiency, unspecified: Secondary | ICD-10-CM | POA: Diagnosis not present

## 2021-11-21 DIAGNOSIS — I10 Essential (primary) hypertension: Secondary | ICD-10-CM | POA: Diagnosis not present

## 2021-11-22 LAB — VITAMIN D 25 HYDROXY (VIT D DEFICIENCY, FRACTURES): Vit D, 25-Hydroxy: 20.1 ng/mL — ABNORMAL LOW (ref 30.0–100.0)

## 2021-11-22 LAB — LIPID PANEL
Chol/HDL Ratio: 4 ratio (ref 0.0–4.4)
Cholesterol, Total: 159 mg/dL (ref 100–199)
HDL: 40 mg/dL (ref >39)
LDL Chol Calc (NIH): 96 mg/dL (ref 0–99)
Triglycerides: 130 mg/dL (ref 0–149)
VLDL Cholesterol Cal: 23 mg/dL (ref 5–40)

## 2021-11-22 LAB — TSH: TSH: 2.16 u[IU]/mL (ref 0.450–4.500)

## 2021-11-29 ENCOUNTER — Ambulatory Visit (INDEPENDENT_AMBULATORY_CARE_PROVIDER_SITE_OTHER): Payer: BC Managed Care – PPO | Admitting: Family Medicine

## 2021-11-29 ENCOUNTER — Other Ambulatory Visit: Payer: Self-pay

## 2021-11-29 ENCOUNTER — Other Ambulatory Visit (HOSPITAL_COMMUNITY)
Admission: RE | Admit: 2021-11-29 | Discharge: 2021-11-29 | Disposition: A | Discharge status: Home / Self Care | Payer: BC Managed Care – PPO | Source: Ambulatory Visit | Attending: Family Medicine | Admitting: Family Medicine

## 2021-11-29 ENCOUNTER — Encounter: Payer: Self-pay | Admitting: Family Medicine

## 2021-11-29 VITALS — BP 150/100 | HR 72 | Resp 16 | Ht 65.0 in | Wt 211.0 lb

## 2021-11-29 DIAGNOSIS — Z124 Encounter for screening for malignant neoplasm of cervix: Secondary | ICD-10-CM | POA: Insufficient documentation

## 2021-11-29 DIAGNOSIS — Z0000 Encounter for general adult medical examination without abnormal findings: Secondary | ICD-10-CM

## 2021-11-29 DIAGNOSIS — I10 Essential (primary) hypertension: Secondary | ICD-10-CM

## 2021-11-29 DIAGNOSIS — Z0001 Encounter for general adult medical examination with abnormal findings: Secondary | ICD-10-CM | POA: Diagnosis not present

## 2021-11-29 MED ORDER — SPIRONOLACTONE 50 MG PO TABS: 50.0000 mg | ORAL_TABLET | Freq: Every day | ORAL | 3 refills | Status: DC

## 2021-11-29 NOTE — Patient Instructions (Addendum)
F/U in 2 months, re evaluate blood pressurecall if you need me sooner  Excellent labs  Blood pressure is high, take an additional spironolactone 50 mg tablet once daily, so total daily does ofspironolactone is 75 mg once daily. You may take together with the 25 mg tablet, do not stop that Continue cozaar as before  Congrats on new eating habits which are healthy  It is important that you exercise regularly at least 30 minutes 5 times a week. If you develop chest pain, have severe difficulty breathing, or feel very tired, stop exercising immediately and seek medical attention   Start daily vit D3 5000 IU this is oTC  Thanks for choosing [facility], we consider it a privelige to serve you.

## 2021-11-29 NOTE — Assessment & Plan Note (Signed)

## 2021-11-29 NOTE — Progress Notes (Signed)
° ° °  Marcia Hahn     MRN: [id number]      DOB: [birth year]  HPI: Patient is in for annual physical exam. Uncontrolled blood pressure is addressed. Recent labs,  are reviewed. Immunization is reviewed , and  she declines the vaccines needed PE: BP (!) 150/100    Pulse 72    Resp 16    Ht 5\' 5"  (1.651 m)    Wt 211 lb (95.7 kg)    LMP 11/16/2021    SpO2 98%    BMI 35.11 kg/m   Pleasant  female, alert and oriented x 3, in no cardio-pulmonary distress. Afebrile. HEENT No facial trauma or asymetry. Sinuses non tender.  Extra occullar muscles intact.. External ears normal, . Neck: supple, no adenopathy,JVD or thyromegaly.No bruits.  Chest: Clear to ascultation bilaterally.No crackles or wheezes. Non tender to palpation  Breast: No asymetry,no masses or lumps. No tenderness. No nipple discharge or inversion. No axillary or supraclavicular adenopathy  Cardiovascular system; Heart sounds normal,  S1 and  S2 ,no S3.  No murmur, or thrill. Apical beat not displaced Peripheral pulses normal.  Abdomen: Soft, non tender, no organomegaly or masses. No bruits. Bowel sounds normal. No guarding, tenderness or rebound.   GU: External genitalia normal female genitalia , normal female distribution of hair. No lesions. Urethral meatus normal in size, no  Prolapse, no lesions visibly  Present. Bladder non tender. Vagina pink and moist , with no visible lesions , discharge present . Adequate pelvic support no  cystocele or rectocele noted Cervix pink and appears healthy, no lesions or ulcerations noted, no discharge noted from os Uterus normal size, no adnexal masses, no cervical motion or adnexal tenderness.   Musculoskeletal exam: Full ROM of spine, hips , shoulders and knees. No deformity ,swelling or crepitus noted. No muscle wasting or atrophy.   Neurologic: Cranial nerves 2 to 12 intact. Power, tone ,sensation and reflexes normal throughout. No disturbance in gait. No  tremor.  Skin: Intact, no ulceration, erythema , scaling or rash noted. Pigmentation normal throughout  Psych; Normal mood and affect. Judgement and concentration normal   Assessment & Plan:  Annual physical exam Annual exam as documented. Counseling done  re healthy lifestyle involving commitment to 150 minutes exercise per week, heart healthy diet, and attaining healthy weight.The importance of adequate sleep also discussed. Regular seat belt use and home safety, is also discussed. Changes in health habits are decided on by the patient with goals and time frames  set for achieving them. Immunization and cancer screening needs are specifically addressed at this visit.   Benign essential HTN Not controlled. Med adjusted DASH diet and commitment to daily physical activity for a minimum of 30 minutes discussed and encouraged, as a part of hypertension management. The importance of attaining a healthy weight is also discussed.  BP/Weight 11/29/2021 10/16/2021 07/25/2021 07/03/2021 03/29/2021 03/17/2021 7/86/7672  Systolic BP 094 709 628 366 294 765 -  Diastolic BP 465 95 82 75 87 91 -  Wt. (Lbs) 211 214 211 213.41 208.4 207 212.8  BMI 35.11 35.61 34.06 34.44 33.64 33.41 34.35

## 2021-11-29 NOTE — Assessment & Plan Note (Signed)
Not controlled. Med adjusted DASH diet and commitment to daily physical activity for a minimum of 30 minutes discussed and encouraged, as a part of hypertension management. The importance of attaining a healthy weight is also discussed.  BP/Weight 11/29/2021 10/16/2021 07/25/2021 07/03/2021 03/29/2021 03/17/2021 03/28/16  Systolic BP 494 496 759 163 846 659 -  Diastolic BP 935 95 82 75 87 91 -  Wt. (Lbs) 211 214 211 213.41 208.4 207 212.8  BMI 35.11 35.61 34.06 34.44 33.64 33.41 34.35

## 2021-12-04 LAB — CYTOLOGY - PAP
Comment: NEGATIVE
Diagnosis: NEGATIVE
High risk HPV: NEGATIVE

## 2022-01-03 ENCOUNTER — Telehealth (HOSPITAL_COMMUNITY): Payer: Self-pay | Admitting: Pharmacy Technician

## 2022-01-03 ENCOUNTER — Other Ambulatory Visit (HOSPITAL_COMMUNITY): Payer: Self-pay | Admitting: *Deleted

## 2022-01-03 DIAGNOSIS — C9210 Chronic myeloid leukemia, BCR/ABL-positive, not having achieved remission: Secondary | ICD-10-CM

## 2022-01-03 MED ORDER — DASATINIB 70 MG PO TABS: ORAL_TABLET | ORAL | 6 refills | Status: DC

## 2022-01-04 NOTE — Telephone Encounter (Signed)
Oral Oncology Patient Advocate Encounter   Received notification from [employer] that the existing prior authorization for Sprycel is due for renewal.   Renewal PA submitted by fax on 01/03/22 to [phone]. Status is pending   Oral Oncology Clinic will continue to follow.  [place] Patient Marcia Hahn Phone [phone] Fax [phone]

## 2022-01-04 NOTE — Telephone Encounter (Signed)
Oral Oncology Patient Advocate Encounter  Prior Authorization for Sprycel has been approved.    PA# [id number] Effective dates: 01/03/22 through 01/03/23  Oral Oncology Clinic will continue to follow.   [place] Patient [place] Phone [phone] Fax [phone] 01/04/2022 11:53 AM

## 2022-01-23 ENCOUNTER — Other Ambulatory Visit (HOSPITAL_COMMUNITY): Payer: Self-pay | Admitting: *Deleted

## 2022-01-23 DIAGNOSIS — C9210 Chronic myeloid leukemia, BCR/ABL-positive, not having achieved remission: Secondary | ICD-10-CM

## 2022-01-30 ENCOUNTER — Other Ambulatory Visit (HOSPITAL_COMMUNITY): Payer: Self-pay | Admitting: *Deleted

## 2022-01-31 ENCOUNTER — Encounter: Payer: Self-pay | Admitting: Gastroenterology

## 2022-01-31 DIAGNOSIS — C9310 Chronic myelomonocytic leukemia not having achieved remission: Secondary | ICD-10-CM | POA: Diagnosis not present

## 2022-02-02 ENCOUNTER — Ambulatory Visit: Payer: BC Managed Care – PPO | Admitting: Family Medicine

## 2022-02-09 ENCOUNTER — Ambulatory Visit: Payer: BC Managed Care – PPO | Admitting: Gastroenterology

## 2022-02-09 ENCOUNTER — Other Ambulatory Visit (HOSPITAL_COMMUNITY): Payer: BC Managed Care – PPO

## 2022-02-15 ENCOUNTER — Inpatient Hospital Stay (HOSPITAL_COMMUNITY): Payer: BC Managed Care – PPO | Attending: Hematology | Admitting: Hematology

## 2022-02-15 VITALS — BP 132/68 | HR 70 | Temp 98.4°F | Resp 18 | Ht 65.35 in | Wt 216.3 lb

## 2022-02-15 DIAGNOSIS — R200 Anesthesia of skin: Secondary | ICD-10-CM | POA: Diagnosis not present

## 2022-02-15 DIAGNOSIS — I10 Essential (primary) hypertension: Secondary | ICD-10-CM | POA: Insufficient documentation

## 2022-02-15 DIAGNOSIS — L700 Acne vulgaris: Secondary | ICD-10-CM | POA: Diagnosis not present

## 2022-02-15 DIAGNOSIS — Z79899 Other long term (current) drug therapy: Secondary | ICD-10-CM | POA: Diagnosis not present

## 2022-02-15 DIAGNOSIS — C9210 Chronic myeloid leukemia, BCR/ABL-positive, not having achieved remission: Secondary | ICD-10-CM | POA: Diagnosis not present

## 2022-02-15 DIAGNOSIS — E669 Obesity, unspecified: Secondary | ICD-10-CM | POA: Diagnosis not present

## 2022-02-15 DIAGNOSIS — K5900 Constipation, unspecified: Secondary | ICD-10-CM | POA: Diagnosis not present

## 2022-02-15 DIAGNOSIS — Z803 Family history of malignant neoplasm of breast: Secondary | ICD-10-CM | POA: Diagnosis not present

## 2022-02-15 NOTE — Progress Notes (Signed)
? ?[facility] ?[address] ?[place], [place] [address] ? ? ?CLINIC:  ?Medical Oncology/Hematology ? ?PCP:  ?Fayrene Helper, MD ?[address] [place] / [place] [address]  ?[phone] ? ?REASON FOR VISIT:  ?Follow-up for CML ? ?PRIOR THERAPY: none ? ?CURRENT THERAPY: Dasatinib 70 mg daily ? ?INTERVAL HISTORY:  ?[name], a 46 y.o. female, returns for routine follow-up for her CML. [name] was last seen on 10/16/2021. ? ?Today she reports feeling good. She is taking dasatinib and tolerating it well. She reports occasional mild constipation. She has reduced sodium in her diet. She reports her spironolactone was increased in January to 75 mg daily. She denies SOB.  ? ?REVIEW OF SYSTEMS:  ?Review of Systems  ?Constitutional:  Negative for appetite change and fatigue.  ?Respiratory:  Negative for shortness of breath.   ?Gastrointestinal:  Positive for constipation.  ?Neurological:  Positive for numbness.  ?All other systems reviewed and are negative. ? ?PAST MEDICAL/SURGICAL HISTORY:  ?Past Medical History:  ?Diagnosis Date  ? Abnormal mammogram of right breast 09/03/2018  ? Acne vulgaris   ? CML (chronic myelocytic leukemia) ([place])   ? Hypertension   ? Mild obesity   ? ?Past Surgical History:  ?Procedure Laterality Date  ? BREAST BIOPSY Left   ? CESAREAN SECTION    ? COLONOSCOPY WITH PROPOFOL N/A 03/17/2021  ? Procedure: COLONOSCOPY WITH PROPOFOL;  Surgeon: Eloise Harman, DO;  [facility];  Service: Endoscopy;  Laterality: N/A;  ASA II / AM procedure  ? DILATATION AND CURETTAGE/HYSTEROSCOPY WITH MINERVA N/A 10/26/2020  ? Procedure: DILATATION AND CURETTAGE/HYSTEROSCOPY WITH MINERVA ENDOMETRIAL ABLATION;  Surgeon: Florian Buff, MD;  Location: [facility];  Service: Gynecology;  Laterality: N/A;  ? LAPAROSCOPIC BILATERAL SALPINGECTOMY Bilateral 10/26/2020  ? Procedure: LAPAROSCOPIC BILATERAL SALPINGECTOMY;  Surgeon: Florian Buff, MD;  Location: [facility];  Service: Gynecology;   Laterality: Bilateral;  ? POLYPECTOMY  03/17/2021  ? Procedure: POLYPECTOMY;  Surgeon: Eloise Harman, DO;  [facility];  Service: Endoscopy;;  ? ? ?SOCIAL HISTORY:  ?Social History  ? ?Socioeconomic History  ? Marital status: Married  ?  Spouse name: Not on file  ? Number of children: 2  ? Years of education: Not on file  ? Highest education level: Not on file  ?Occupational History  ? Occupation: Psychologist, educational   ?Tobacco Use  ? Smoking status: Former  ?  Types: Cigarettes  ?  Quit date: 12/04/2012  ?  Years since quitting: 9.2  ? Smokeless tobacco: Never  ?Vaping Use  ? Vaping Use: Never used  ?Substance and Sexual Activity  ? Alcohol use: Yes  ?  Comment: occasional  ? Drug use: No  ? Sexual activity: Not Currently  ?  Birth control/protection: Surgical  ?  Comment: tubal and ablation  ?Other Topics Concern  ? Not on file  ?Social History Narrative  ? Mother of twins   ? ?Social Determinants of Health  ? ?Financial Resource Strain: Not on file  ?Food Insecurity: Not on file  ?Transportation Needs: Not on file  ?Physical Activity: Not on file  ?Stress: Not on file  ?Social Connections: Not on file  ?Intimate Partner Violence: Not on file  ? ? ?FAMILY HISTORY:  ?Family History  ?Problem Relation Age of Onset  ? GER disease Mother   ? Diabetes Mother   ? Hypertension Mother   ? Heart attack Father 61  ? Heart disease Father   ?  Migraines Sister   ? Breast cancer Maternal Grandmother   ? Stroke Maternal Grandmother   ? Breast cancer Cousin   ? Stroke Maternal Aunt   ? Colon cancer Neg Hx   ? ? ?CURRENT MEDICATIONS:  ?Current Outpatient Medications  ?Medication Sig Dispense Refill  ? dasatinib (SPRYCEL) 70 MG tablet TAKE 1 TABLET (70 MG TOTAL) BY MOUTH DAILY. 30 tablet 6  ? loratadine (CLARITIN) 10 MG tablet TAKE 1 TABLET(10 MG) BY MOUTH DAILY 90 tablet 1  ? losartan (COZAAR) 100 MG tablet Take 1 tablet (100 mg total) by mouth daily. 90 tablet 3  ? Multiple Vitamins-Minerals (MULTIVITAMIN WITH MINERALS)  tablet Take 1 tablet by mouth at bedtime.    ? spironolactone (ALDACTONE) 25 MG tablet TAKE 1 TABLET(25 MG) BY MOUTH DAILY 30 tablet 2  ? spironolactone (ALDACTONE) 50 MG tablet Take 1 tablet (50 mg total) by mouth daily. 30 tablet 3  ? ?No current facility-administered medications for this visit.  ? ? ?ALLERGIES:  ?Allergies  ?Allergen Reactions  ? Amlodipine   ?  Headache   ? Tomato Itching and Swelling  ?  Processed tomatoes in the can.   ? ? ?PHYSICAL EXAM:  ?Performance status (ECOG): 1 - Symptomatic but completely ambulatory ? ?Vitals:  ? 02/15/22 1545  ?BP: 132/68  ?Pulse: 70  ?Resp: 18  ?Temp: 98.4 ?F (36.9 ?C)  ?SpO2: 100%  ? ?Wt Readings from Last 3 Encounters:  ?02/15/22 216 lb 4.8 oz (98.1 kg)  ?11/29/21 211 lb (95.7 kg)  ?10/16/21 214 lb (97.1 kg)  ? ?Physical Exam ?Vitals reviewed.  ?Constitutional:   ?   Appearance: Normal appearance. She is obese.  ?Cardiovascular:  ?   Rate and Rhythm: Normal rate and regular rhythm.  ?   Pulses: Normal pulses.  ?   Heart sounds: Normal heart sounds.  ?Pulmonary:  ?   Effort: Pulmonary effort is normal.  ?   Breath sounds: Normal breath sounds.  ?Abdominal:  ?   Palpations: Abdomen is soft. There is no hepatomegaly, splenomegaly or mass.  ?   Tenderness: There is no abdominal tenderness.  ?Musculoskeletal:  ?   Right lower leg: No edema.  ?   Left lower leg: No edema.  ?Neurological:  ?   General: No focal deficit present.  ?   Mental Status: She is alert and oriented to person, place, and time.  ?Psychiatric:     ?   Mood and Affect: Mood normal.     ?   Behavior: Behavior normal.  ? ? ?LABORATORY DATA:  ?I have reviewed the labs as listed.  ? ?  Latest Ref Rng & Units 10/09/2021  ?  2:48 PM 06/27/2021  ?  3:40 PM 03/22/2021  ? 10:36 AM  ?CBC  ?WBC 4.0 - 10.5 K/uL 6.8   6.7   5.8    ?Hemoglobin 12.0 - 15.0 g/dL 11.8   11.8   12.4    ?Hematocrit 36.0 - 46.0 % 35.7   36.4   38.1    ?Platelets 150 - 400 K/uL 261   300   256    ? ? ?  Latest Ref Rng & Units 10/09/2021  ?   2:48 PM 06/27/2021  ?  3:40 PM 03/22/2021  ? 10:36 AM  ?CMP  ?Glucose 70 - 99 mg/dL 84   94   97    ?BUN 6 - 20 mg/dL '17   14   16    ' ?Creatinine 0.44 - 1.00  mg/dL 1.04   1.15   0.86    ?Sodium 135 - 145 mmol/L 139   136   136    ?Potassium 3.5 - 5.1 mmol/L 3.5   3.6   3.5    ?Chloride 98 - 111 mmol/L 106   106   104    ?CO2 22 - 32 mmol/L '26   27   25    ' ?Calcium 8.9 - 10.3 mg/dL 8.5   8.4   8.6    ?Total Protein 6.5 - 8.1 g/dL 6.8   6.9   7.1    ?Total Bilirubin 0.3 - 1.2 mg/dL 0.2   0.6   0.4    ?Alkaline Phos 38 - 126 U/L 54   62   46    ?AST 15 - 41 U/L 36   27   22    ?ALT 0 - 44 U/L 41   43   21    ? ?   ?Component Value Date/Time  ? RBC 3.76 (L) 10/09/2021 1448  ? MCV 94.9 10/09/2021 1448  ? MCH 31.4 10/09/2021 1448  ? MCHC 33.1 10/09/2021 1448  ? RDW 12.3 10/09/2021 1448  ? LYMPHSABS 1.7 10/09/2021 1448  ? MONOABS 0.4 10/09/2021 1448  ? EOSABS 0.1 10/09/2021 1448  ? BASOSABS 0.0 10/09/2021 1448  ? ? ? ? ? ?DIAGNOSTIC IMAGING:  ?I have independently reviewed the scans and discussed with the patient. ?No results found.  ? ?ASSESSMENT:  ?1.  CML in chronic phase: ?-Presentation with hyperleukocytosis.  BCR/ABL by FISH positive. ?-CT chest PE protocol showed splenomegaly measuring 14 cm. ?-BMBX on 02/23/2020 shows hypercellular marrow with myeloid hyperplasia consistent with CML.  No increase in blasts.  Flow cytometry less than 1% blasts.  Chromosome analysis shows Philadelphia chromosome. ?-Dasatinib 70 mg daily started on 04/01/2020. ?-BCR/ABL by quantitative PCR on 06/30/2020 shows B3 A2 transcript improved to 2.19 from 128 previously.  B2 A2 and E1 A2 transcripts are undetectable. ?-BCR/ABL by quantitative PCR on 11/21/2020 -. ?- BCR/ABL from 03/22/2021 was negative. ?- BCR/ABL on 06/27/2021 was undetectable. ?- BCR/ABL on 01/31/2022 ([facility])-0.037% ? ? ?PLAN:  ?1.  CML in chronic phase: ?- She is tolerating Dasatinib without any major problems. ?- She denies any new medications. ?- Physical examination does  not show any palpable adenopathy or splenomegaly.  No evidence of fluid retention. ?- Reviewed labs from [facility] dated 01/31/2022.  Hemoglobin is 11.4.  White count is normal at 7.9 and platelet count is

## 2022-02-15 NOTE — Patient Instructions (Signed)
[place] at [facility] ?Discharge Instructions ? ?You were seen and examined today by Dr. Delton Coombes. He reviewed your most recent labs and everything looks good. Please keep follow up appointments as scheduled in 4 months. ? ? ?Thank you for choosing [place] at [facility] to provide your oncology and hematology care.  To afford each patient quality time with our provider, please arrive at least 15 minutes before your scheduled appointment time.  ? ?If you have a lab appointment with the [facility] please come in thru the Main Entrance and check in at the main information desk. ? ?You need to re-schedule your appointment should you arrive 10 or more minutes late.  We strive to give you quality time with our providers, and arriving late affects you and other patients whose appointments are after yours.  Also, if you no show three or more times for appointments you may be dismissed from the clinic at the providers discretion.     ?Again, thank you for choosing [facility].  Our hope is that these requests will decrease the amount of time that you wait before being seen by our physicians.       ?_____________________________________________________________ ? ?Should you have questions after your visit to [facility], please contact our office at [phone] and follow the prompts.  Our office hours are 8:00 a.m. and 4:30 p.m. Monday - Friday.  Please note that voicemails left after 4:00 p.m. may not be returned until the following business day.  We are closed weekends and major holidays.  You do have access to a nurse 24-7, just call the main number to the clinic [phone] and do not press any options, hold on the line and a nurse will answer the phone.   ? ?For prescription refill requests, have your pharmacy contact our office and allow 72 hours.   ? ?Due to Covid, you will need to wear a mask upon entering the hospital. If you do  not have a mask, a mask will be given to you at the Main Entrance upon arrival. For doctor visits, patients may have 1 support person age 74 or older with them. For treatment visits, patients can not have anyone with them due to social distancing guidelines and our immunocompromised population.  ? ?  ?

## 2022-02-23 ENCOUNTER — Other Ambulatory Visit: Payer: Self-pay | Admitting: Family Medicine

## 2022-03-05 ENCOUNTER — Telehealth: Payer: BC Managed Care – PPO | Admitting: Gastroenterology

## 2022-03-05 ENCOUNTER — Telehealth: Payer: Self-pay

## 2022-03-05 DIAGNOSIS — C9310 Chronic myelomonocytic leukemia not having achieved remission: Secondary | ICD-10-CM | POA: Diagnosis not present

## 2022-03-05 NOTE — Telephone Encounter (Signed)
Pt wants to cancel appt for today. Will call when she is ready to move forward with scheduling repeat colonoscopy.  ?

## 2022-03-06 ENCOUNTER — Telehealth: Payer: BC Managed Care – PPO | Admitting: Gastroenterology

## 2022-03-07 ENCOUNTER — Ambulatory Visit: Payer: BC Managed Care – PPO | Admitting: Gastroenterology

## 2022-03-21 ENCOUNTER — Other Ambulatory Visit (HOSPITAL_COMMUNITY): Payer: Self-pay

## 2022-03-21 ENCOUNTER — Other Ambulatory Visit (HOSPITAL_COMMUNITY): Payer: Self-pay | Admitting: *Deleted

## 2022-03-21 DIAGNOSIS — C9210 Chronic myeloid leukemia, BCR/ABL-positive, not having achieved remission: Secondary | ICD-10-CM

## 2022-03-21 MED ORDER — DASATINIB 70 MG PO TABS: ORAL_TABLET | ORAL | 11 refills | Status: DC

## 2022-03-21 NOTE — Telephone Encounter (Signed)
New prescription printed and sent to [employer] and [employer] for patient assistance with paying for the drug.   ?

## 2022-03-21 NOTE — Progress Notes (Signed)
Sprycel refill faxed per Dr. Delton Coombes verbal order. ?

## 2022-04-16 ENCOUNTER — Telehealth (HOSPITAL_COMMUNITY): Payer: Self-pay | Admitting: Pharmacy Technician

## 2022-04-16 NOTE — Telephone Encounter (Signed)
Oral Oncology Patient Advocate Encounter  Received notification that patients insurance requires the patient to apply for Patient Assistance through the manufacturer.    Application for BMS was faxed to [employer] at [phone], Attn: Dawn on 02/22/22.  After [employer] conducted a benefits investigation, they denied patient assistance due to the patient having and "alternate funding plan."  As of February 12, 2022 [employer] no longer allows patients with AFP's to be eligible for BMSPAF.  I faxed the denial letter to [employer] at [employer] and she was able to get an override for the patient to keep receiving her Sprycel at [facility].  Informed Dr Delton Coombes that patient is able to receive her medication.  [place] Patient [place] Phone [phone] Fax [phone] 04/16/2022 11:26 AM

## 2022-05-16 ENCOUNTER — Ambulatory Visit: Payer: BC Managed Care – PPO | Admitting: Family Medicine

## 2022-06-19 DIAGNOSIS — C9310 Chronic myelomonocytic leukemia not having achieved remission: Secondary | ICD-10-CM | POA: Diagnosis not present

## 2022-06-21 ENCOUNTER — Ambulatory Visit: Payer: BC Managed Care – PPO | Admitting: Hematology

## 2022-07-09 ENCOUNTER — Inpatient Hospital Stay: Payer: BC Managed Care – PPO | Attending: Hematology | Admitting: Hematology

## 2022-07-09 VITALS — BP 176/90 | HR 68 | Temp 97.6°F | Resp 18 | Ht 66.0 in | Wt 226.0 lb

## 2022-07-09 DIAGNOSIS — R51 Headache with orthostatic component, not elsewhere classified: Secondary | ICD-10-CM | POA: Insufficient documentation

## 2022-07-09 DIAGNOSIS — Z803 Family history of malignant neoplasm of breast: Secondary | ICD-10-CM | POA: Diagnosis not present

## 2022-07-09 DIAGNOSIS — Z800 Family history of malignant neoplasm of digestive organs: Secondary | ICD-10-CM | POA: Insufficient documentation

## 2022-07-09 DIAGNOSIS — Z87891 Personal history of nicotine dependence: Secondary | ICD-10-CM | POA: Diagnosis not present

## 2022-07-09 DIAGNOSIS — D649 Anemia, unspecified: Secondary | ICD-10-CM | POA: Insufficient documentation

## 2022-07-09 DIAGNOSIS — K5900 Constipation, unspecified: Secondary | ICD-10-CM | POA: Insufficient documentation

## 2022-07-09 DIAGNOSIS — C9210 Chronic myeloid leukemia, BCR/ABL-positive, not having achieved remission: Secondary | ICD-10-CM | POA: Diagnosis not present

## 2022-07-09 DIAGNOSIS — I10 Essential (primary) hypertension: Secondary | ICD-10-CM | POA: Diagnosis not present

## 2022-07-09 NOTE — Patient Instructions (Addendum)
[place]  Discharge Instructions  You were seen and examined today by Dr. Delton Coombes.  Dr. Delton Coombes discussed your most recent lab work and everything looks good.  Follow-up as scheduled in 4 months with labs prior.    Thank you for choosing [place] to provide your oncology and hematology care.   To afford each patient quality time with our provider, please arrive at least 15 minutes before your scheduled appointment time. You may need to reschedule your appointment if you arrive late (10 or more minutes). Arriving late affects you and other patients whose appointments are after yours.  Also, if you miss three or more appointments without notifying the office, you may be dismissed from the clinic at the provider's discretion.    Again, thank you for choosing [facility].  Our hope is that these requests will decrease the amount of time that you wait before being seen by our physicians.   If you have a lab appointment with the [facility] please come in thru the Main Entrance and check in at the main information desk.           _____________________________________________________________  Should you have questions after your visit to [facility], please contact our office at [phone] and follow the prompts.  Our office hours are 8:00 a.m. to 4:30 p.m. Monday - Thursday and 8:00 a.m. to 2:30 p.m. Friday.  Please note that voicemails left after 4:00 p.m. may not be returned until the following business day.  We are closed weekends and all major holidays.  You do have access to a nurse 24-7, just call the main number to the clinic [phone] and do not press any options, hold on the line and a nurse will answer the phone.    For prescription refill requests, have your pharmacy contact our office and allow 72 hours.

## 2022-07-09 NOTE — Progress Notes (Signed)
Marcia Hahn, [place] [id number]   CLINIC:  Medical Oncology/Hematology  PCP:  Fayrene Helper, MD [address] / [place] [address]  [phone]  REASON FOR VISIT:  Follow-up for CML  PRIOR THERAPY: none  CURRENT THERAPY: Dasatinib 70 mg daily  INTERVAL HISTORY:  [name], a 46 y.o. female, seen for follow-up of chronic myeloid leukemia.  She was last seen on 02/15/2022.  She reported migraine headaches since last visit, once per week for 3 weeks beginning in August.  Her blood pressure at home is staying around 135/88.  She continues to be on spironolactone and losartan.  No GI symptoms other than constipation reported.  REVIEW OF SYSTEMS:  Review of Systems  Constitutional:  Negative for appetite change and fatigue.  Respiratory:  Negative for shortness of breath.   Gastrointestinal:  Positive for constipation.  Neurological:  Positive for headaches. Negative for numbness.  All other systems reviewed and are negative.   PAST MEDICAL/SURGICAL HISTORY:  Past Medical History:  Diagnosis Date   Abnormal mammogram of right breast 09/03/2018   Acne vulgaris    CML (chronic myelocytic leukemia) ([place])    Hypertension    Mild obesity    Past Surgical History:  Procedure Laterality Date   BREAST BIOPSY Left    CESAREAN SECTION     COLONOSCOPY WITH PROPOFOL N/A 03/17/2021   Procedure: COLONOSCOPY WITH PROPOFOL;  Surgeon: Eloise Harman, DO;  Location: [facility];  Service: Endoscopy;  Laterality: N/A;  ASA II / AM procedure   DILATATION AND CURETTAGE/HYSTEROSCOPY WITH MINERVA N/A 10/26/2020   Procedure: DILATATION AND CURETTAGE/HYSTEROSCOPY WITH MINERVA ENDOMETRIAL ABLATION;  Surgeon: Florian Buff, MD;  Location: [facility] ORS;  Service: Gynecology;  Laterality: N/A;   LAPAROSCOPIC BILATERAL SALPINGECTOMY Bilateral 10/26/2020   Procedure: LAPAROSCOPIC BILATERAL SALPINGECTOMY;  Surgeon: Florian Buff, MD;   Location: [facility] ORS;  Service: Gynecology;  Laterality: Bilateral;   POLYPECTOMY  03/17/2021   Procedure: POLYPECTOMY;  Surgeon: Eloise Harman, DO;  Location: [facility];  Service: Endoscopy;;    SOCIAL HISTORY:  Social History   Socioeconomic History   Marital status: Married    Spouse name: Not on file   Number of children: 2   Years of education: Not on file   Highest education level: Not on file  Occupational History   Occupation: Psychologist, educational   Tobacco Use   Smoking status: Former    Types: Cigarettes    Quit date: 12/04/2012    Years since quitting: 9.6   Smokeless tobacco: Never  Vaping Use   Vaping Use: Never used  Substance and Sexual Activity   Alcohol use: Yes    Comment: occasional   Drug use: No   Sexual activity: Not Currently    Birth control/protection: Surgical    Comment: tubal and ablation  Other Topics Concern   Not on file  Social History Narrative   Mother of twins    Social Determinants of Health   Financial Resource Strain: Low Risk  (08/25/2020)   Overall Financial Resource Strain (CARDIA)    Difficulty of Paying Living Expenses: Not hard at all  Food Insecurity: No Food Insecurity (08/25/2020)   Hunger Vital Sign    Worried About Running Out of Food in the Last Year: Never true    [place] in the Last Year: Never true  Transportation Needs: No Transportation Needs (08/25/2020)   PRAPARE -  Hydrologist (Medical): No    Lack of Transportation (Non-Medical): No  Physical Activity: Inactive (08/25/2020)   Exercise Vital Sign    Days of Exercise per Week: 0 days    Minutes of Exercise per Session: 0 min  Stress: No Stress Concern Present (08/25/2020)   [place]    Feeling of Stress : Not at all  Social Connections: Moderately Integrated (08/25/2020)   Social Connection and Isolation Panel [NHANES]    Frequency of Communication with  Friends and Family: More than three times a week    Frequency of Social Gatherings with Friends and Family: Once a week    Attends Religious Services: 1 to 4 times per year    Active Member of [employer] or Organizations: No    Attends Archivist Meetings: Never    Marital Status: Married  Human resources officer Violence: Not At Risk (08/25/2020)   Humiliation, Afraid, Rape, and Kick questionnaire    Fear of Current or Ex-Partner: No    Emotionally Abused: No    Physically Abused: No    Sexually Abused: No    FAMILY HISTORY:  Family History  Problem Relation Age of Onset   GER disease Mother    Diabetes Mother    Hypertension Mother    Heart attack Father 16   Heart disease Father    Migraines Sister    Breast cancer Maternal Grandmother    Stroke Maternal Grandmother    Breast cancer Cousin    Stroke Maternal Aunt    Colon cancer Neg Hx     CURRENT MEDICATIONS:  Current Outpatient Medications  Medication Sig Dispense Refill   dasatinib (SPRYCEL) 70 MG tablet TAKE 1 TABLET (70 MG TOTAL) BY MOUTH DAILY. 30 tablet 11   loratadine (CLARITIN) 10 MG tablet TAKE 1 TABLET(10 MG) BY MOUTH DAILY 90 tablet 1   losartan (COZAAR) 100 MG tablet Take 1 tablet (100 mg total) by mouth daily. 90 tablet 3   Multiple Vitamins-Minerals (MULTIVITAMIN WITH MINERALS) tablet Take 1 tablet by mouth at bedtime.     spironolactone (ALDACTONE) 25 MG tablet TAKE 1 TABLET(25 MG) BY MOUTH DAILY 30 tablet 2   spironolactone (ALDACTONE) 50 MG tablet TAKE 1 TABLET(50 MG) BY MOUTH DAILY 30 tablet 3   Vitamin D, Ergocalciferol, (DRISDOL) 1.25 MG (50000 UNIT) CAPS capsule      No current facility-administered medications for this visit.    ALLERGIES:  Allergies  Allergen Reactions   Amlodipine     Headache    Tomato Itching and Swelling    Processed tomatoes in the can.     PHYSICAL EXAM:  Performance status (ECOG): 1 - Symptomatic but completely ambulatory  Vitals:   07/09/22 1401  BP: (!)  176/90  Pulse: 68  Resp: 18  Temp: 97.6 F (36.4 C)  SpO2: 100%   Wt Readings from Last 3 Encounters:  07/09/22 226 lb (102.5 kg)  02/15/22 216 lb 4.8 oz (98.1 kg)  11/29/21 211 lb (95.7 kg)   Physical Exam Vitals reviewed.  Constitutional:      Appearance: Normal appearance. She is obese.  Cardiovascular:     Rate and Rhythm: Normal rate and regular rhythm.     Pulses: Normal pulses.     Heart sounds: Normal heart sounds.  Pulmonary:     Effort: Pulmonary effort is normal.     Breath sounds: Normal breath sounds.  Abdominal:  Palpations: Abdomen is soft. There is no hepatomegaly, splenomegaly or mass.     Tenderness: There is no abdominal tenderness.  Musculoskeletal:     Right lower leg: No edema.     Left lower leg: No edema.  Neurological:     General: No focal deficit present.     Mental Status: She is alert and oriented to person, place, and time.  Psychiatric:        Mood and Affect: Mood normal.        Behavior: Behavior normal.    LABORATORY DATA:  I have reviewed the labs as listed.     Latest Ref Rng & Units 10/09/2021    2:48 PM 06/27/2021    3:40 PM 03/22/2021   10:36 AM  CBC  WBC 4.0 - 10.5 K/uL 6.8  6.7  5.8   Hemoglobin 12.0 - 15.0 g/dL 11.8  11.8  12.4   Hematocrit 36.0 - 46.0 % 35.7  36.4  38.1   Platelets 150 - 400 K/uL 261  300  256       Latest Ref Rng & Units 10/09/2021    2:48 PM 06/27/2021    3:40 PM 03/22/2021   10:36 AM  CMP  Glucose 70 - 99 mg/dL 84  94  97   BUN 6 - 20 mg/dL '17  14  16   ' Creatinine 0.44 - 1.00 mg/dL 1.04  1.15  0.86   Sodium 135 - 145 mmol/L 139  136  136   Potassium 3.5 - 5.1 mmol/L 3.5  3.6  3.5   Chloride 98 - 111 mmol/L 106  106  104   CO2 22 - 32 mmol/L '26  27  25   ' Calcium 8.9 - 10.3 mg/dL 8.5  8.4  8.6   Total Protein 6.5 - 8.1 g/dL 6.8  6.9  7.1   Total Bilirubin 0.3 - 1.2 mg/dL 0.2  0.6  0.4   Alkaline Phos 38 - 126 U/L 54  62  46   AST 15 - 41 U/L 36  27  22   ALT 0 - 44 U/L 41  43  21        Component Value Date/Time   RBC 3.76 (L) 10/09/2021 1448   MCV 94.9 10/09/2021 1448   MCH 31.4 10/09/2021 1448   MCHC 33.1 10/09/2021 1448   RDW 12.3 10/09/2021 1448   LYMPHSABS 1.7 10/09/2021 1448   MONOABS 0.4 10/09/2021 1448   EOSABS 0.1 10/09/2021 1448   BASOSABS 0.0 10/09/2021 1448       DIAGNOSTIC IMAGING:  I have independently reviewed the scans and discussed with the patient. No results found.   ASSESSMENT:  1.  CML in chronic phase: -Presentation with hyperleukocytosis.  BCR/ABL by FISH positive. -CT chest PE protocol showed splenomegaly measuring 14 cm. -BMBX on 02/23/2020 shows hypercellular marrow with myeloid hyperplasia consistent with CML.  No increase in blasts.  Flow cytometry less than 1% blasts.  Chromosome analysis shows Philadelphia chromosome. -Dasatinib 70 mg daily started on 04/01/2020. -BCR/ABL by quantitative PCR on 06/30/2020 shows B3 A2 transcript improved to 2.19 from 128 previously.  B2 A2 and E1 A2 transcripts are undetectable. -BCR/ABL by quantitative PCR on 11/21/2020 -. - BCR/ABL from 03/22/2021 was negative. - BCR/ABL on 06/27/2021 was undetectable. - BCR/ABL on 01/31/2022 ([facility])-0.037% - BCR/ABL (P2 10 fusion transcript) on 06/20/1999 23-0.004%   PLAN:  1.  CML in chronic phase: - She is tolerating Dasatinib 70 mg daily with some constipation. -  Physical examination did not show any evidence of splenomegaly. - Labs from [facility] dated 06/19/2022 reviewed by me shows WBC-5.9, Hb-12, PLT-259 with normal differential. - BCR/ABL by quantitative PCR shows P2 10 fusion transcript at 0.004%.  This has improved from last time. - CMP was within normal limits. - Continue Dasatinib 70 mg daily.  Use stool softener daily. - RTC 4 months with repeat labs including BCR/ABL by quantitative PCR.   2.  Hypertension: -Continue spironolactone 75 mg daily and losartan.  Blood pressure at home is reported as 135/88.   3.  Normocytic anemia: - Mild normocytic  anemia with hemoglobin 11.8 stable.  Orders placed this encounter:  No orders of the defined types were placed in this encounter.    Derek Jack, MD [place] [phone]

## 2022-07-09 NOTE — Progress Notes (Signed)
Patient is taking Sprycel as prescribed.  She has not missed any doses and reports no side effects at this time other than migraines.

## 2022-07-12 ENCOUNTER — Other Ambulatory Visit (HOSPITAL_COMMUNITY): Payer: Self-pay

## 2022-08-09 ENCOUNTER — Other Ambulatory Visit (HOSPITAL_COMMUNITY): Payer: Self-pay | Admitting: Hematology

## 2022-08-09 ENCOUNTER — Other Ambulatory Visit: Payer: Self-pay | Admitting: *Deleted

## 2022-08-09 DIAGNOSIS — C9210 Chronic myeloid leukemia, BCR/ABL-positive, not having achieved remission: Secondary | ICD-10-CM

## 2022-08-09 NOTE — Telephone Encounter (Signed)
Sprycel refill approved.  Patient is tolerating and is to continue therapy.

## 2022-08-28 ENCOUNTER — Ambulatory Visit: Payer: BC Managed Care – PPO | Admitting: Internal Medicine

## 2022-09-05 ENCOUNTER — Encounter: Payer: Self-pay | Admitting: Family Medicine

## 2022-09-05 ENCOUNTER — Ambulatory Visit: Payer: BC Managed Care – PPO | Admitting: Family Medicine

## 2022-09-05 VITALS — BP 160/100 | HR 68 | Ht 64.0 in | Wt 226.1 lb

## 2022-09-05 DIAGNOSIS — I10 Essential (primary) hypertension: Secondary | ICD-10-CM | POA: Diagnosis not present

## 2022-09-05 DIAGNOSIS — E669 Obesity, unspecified: Secondary | ICD-10-CM | POA: Diagnosis not present

## 2022-09-05 DIAGNOSIS — C9210 Chronic myeloid leukemia, BCR/ABL-positive, not having achieved remission: Secondary | ICD-10-CM

## 2022-09-05 DIAGNOSIS — Z1231 Encounter for screening mammogram for malignant neoplasm of breast: Secondary | ICD-10-CM | POA: Diagnosis not present

## 2022-09-05 DIAGNOSIS — E66811 Obesity, class 1: Secondary | ICD-10-CM

## 2022-09-05 DIAGNOSIS — E785 Hyperlipidemia, unspecified: Secondary | ICD-10-CM | POA: Diagnosis not present

## 2022-09-05 MED ORDER — SPIRONOLACTONE 100 MG PO TABS: 100.0000 mg | ORAL_TABLET | Freq: Every day | ORAL | 2 refills | Status: DC

## 2022-09-05 NOTE — Progress Notes (Signed)
Marcia Hahn     MRN: [id number]      DOB: 10-19-1976   HPI Marcia Hahn is here for follow up and re-evaluation of chronic medical conditions, medication management and review of any available recent lab and radiology data.  Preventive health is updated, specifically  Cancer screening and Immunization.   Questions or concerns regarding consultations or procedures which the PT has had in the interim are  addressed. The PT denies any adverse reactions to current medications since the last visit.  There are no new concerns.  There are no specific complaints   ROS Denies recent fever or chills. Denies sinus pressure, nasal congestion, ear pain or sore throat. Denies chest congestion, productive cough or wheezing. Denies chest pains, palpitations and leg swelling Denies abdominal pain, nausea, vomiting,diarrhea or constipation.   Denies dysuria, frequency, hesitancy or incontinence. Denies joint pain, swelling and limitation in mobility. Denies headaches, seizures, numbness, or tingling. Denies depression, anxiety or insomnia. Denies skin break down or rash.   PE  BP (!) 160/100   Pulse 68   Ht '5\' 4"'$  (1.626 m)   Wt 226 lb 1.9 oz (102.6 kg)   SpO2 98%   BMI 38.81 kg/m   Patient alert and oriented and in no cardiopulmonary distress.  HEENT: No facial asymmetry, EOMI,     Neck supple .  Chest: Clear to auscultation bilaterally.  CVS: S1, S2 no murmurs, no S3.Regular rate.  ABD: Soft non tender.   Ext: No edema  MS: Adequate ROM spine, shoulders, hips and knees.  Skin: Intact, no ulcerations or rash noted.  Psych: Good eye contact, normal affect. Memory intact not anxious or depressed appearing.  CNS: CN 2-12 intact, power,  normal throughout.no focal deficits noted.   Assessment & Plan  Benign essential HTN Uncontrolled, increase dose of spironolactone DASH diet and commitment to daily physical activity for a minimum of 30 minutes discussed and encouraged, as a  part of hypertension management. The importance of attaining a healthy weight is also discussed.     09/05/2022    3:19 PM 09/05/2022    2:49 PM 09/05/2022    2:48 PM 07/09/2022    2:01 PM 02/15/2022    3:45 PM 11/29/2021    4:30 PM 11/29/2021    3:46 PM  BP/Weight  Systolic BP 235 573 220 254 270 623 762  Diastolic BP 831 90 83 90 68 100 78  Wt. (Lbs)   226.12 226 216.3  211  BMI   38.81 kg/m2 36.48 kg/m2 35.61 kg/m2  35.11 kg/m2       Obesity (BMI 30.0-34.9) Deteriorated   Patient re-educated about  the importance of commitment to a  minimum of 150 minutes of exercise per week as able.  The importance of healthy food choices with portion control discussed, as well as eating regularly and within a 12 hour window most days. The need to choose "clean , green" food 50 to 75% of the time is discussed, as well as to make water the primary drink and set a goal of 64 ounces water daily.       09/05/2022    2:48 PM 07/09/2022    2:01 PM 02/15/2022    3:45 PM  Weight /BMI  Weight 226 lb 1.9 oz 226 lb 216 lb 4.8 oz  Height '5\' 4"'$  (1.626 m) '5\' 6"'$  (1.676 m) 5' 5.35" (1.66 m)  BMI 38.81 kg/m2 36.48 kg/m2 35.61 kg/m2      Dyslipidemia (high LDL;  low HDL) Hyperlipidemia:Low fat diet discussed and encouraged.   Lipid Panel  Lab Results  Component Value Date   CHOL 159 11/21/2021   HDL 40 11/21/2021   LDLCALC 96 11/21/2021   TRIG 130 11/21/2021   CHOLHDL 4.0 11/21/2021       CML (chronic myelocytic leukemia) ([place]) Managed by oncology, stable

## 2022-09-05 NOTE — Assessment & Plan Note (Signed)
Deteriorated   Patient re-educated about  the importance of commitment to a  minimum of 150 minutes of exercise per week as able.  The importance of healthy food choices with portion control discussed, as well as eating regularly and within a 12 hour window most days. The need to choose "clean , green" food 50 to 75% of the time is discussed, as well as to make water the primary drink and set a goal of 64 ounces water daily.       09/05/2022    2:48 PM 07/09/2022    2:01 PM 02/15/2022    3:45 PM  Weight /BMI  Weight 226 lb 1.9 oz 226 lb 216 lb 4.8 oz  Height '5\' 4"'$  (1.626 m) '5\' 6"'$  (1.676 m) 5' 5.35" (1.66 m)  BMI 38.81 kg/m2 36.48 kg/m2 35.61 kg/m2

## 2022-09-05 NOTE — Assessment & Plan Note (Signed)
Managed by oncology, stable

## 2022-09-05 NOTE — Assessment & Plan Note (Signed)
Hyperlipidemia:Low fat diet discussed and encouraged.   Lipid Panel  Lab Results  Component Value Date   CHOL 159 11/21/2021   HDL 40 11/21/2021   LDLCALC 96 11/21/2021   TRIG 130 11/21/2021   CHOLHDL 4.0 11/21/2021

## 2022-09-05 NOTE — Assessment & Plan Note (Signed)
Uncontrolled, increase dose of spironolactone DASH diet and commitment to daily physical activity for a minimum of 30 minutes discussed and encouraged, as a part of hypertension management. The importance of attaining a healthy weight is also discussed.     09/05/2022    3:19 PM 09/05/2022    2:49 PM 09/05/2022    2:48 PM 07/09/2022    2:01 PM 02/15/2022    3:45 PM 11/29/2021    4:30 PM 11/29/2021    3:46 PM  BP/Weight  Systolic BP 967 893 810 175 102 585 277  Diastolic BP 824 90 83 90 68 100 78  Wt. (Lbs)   226.12 226 216.3  211  BMI   38.81 kg/m2 36.48 kg/m2 35.61 kg/m2  35.11 kg/m2

## 2022-09-05 NOTE — Patient Instructions (Addendum)
F/U first week in December with medications and home bP cuff re eval uncontrolled BP , call if you need me sooner  New higher dose of spironolactone 100 mg 1 daily starting today.  Continue losartan 100 mg once daily as before.  Fasting Chem-7 and EGFR to be days before next visit.  Please reconsider flu vaccine.  Please schedule mammogram at an [facility] at checkout.

## 2022-09-13 ENCOUNTER — Telehealth: Payer: Self-pay

## 2022-09-13 ENCOUNTER — Other Ambulatory Visit: Payer: Self-pay | Admitting: *Deleted

## 2022-09-13 MED ORDER — LOSARTAN POTASSIUM 100 MG PO TABS: 100.0000 mg | ORAL_TABLET | Freq: Every day | ORAL | 3 refills | Status: DC

## 2022-09-13 MED ORDER — LORATADINE 10 MG PO TABS: ORAL_TABLET | ORAL | 1 refills | Status: DC

## 2022-09-13 NOTE — Telephone Encounter (Signed)
Medication sent to pharmacy  

## 2022-09-13 NOTE — Telephone Encounter (Signed)
Patient called need med refills  losartan (COZAAR) 100 MG tablet [[id number]]   loratadine (CLARITIN) 10 MG tablet [[id number]]   Pham[facility]: [facility] 

## 2022-09-14 ENCOUNTER — Other Ambulatory Visit: Payer: Self-pay

## 2022-09-14 ENCOUNTER — Telehealth: Payer: Self-pay

## 2022-09-14 MED ORDER — LOSARTAN POTASSIUM 100 MG PO TABS: 100.0000 mg | ORAL_TABLET | Freq: Every day | ORAL | 0 refills | Status: DC

## 2022-09-14 NOTE — Telephone Encounter (Signed)
Refilled

## 2022-09-14 NOTE — Telephone Encounter (Signed)
Patient called need med refills  losartan (COZAAR) 100 MG tablet [[id number]]   Pharmacy: [facility] [place]

## 2022-10-18 ENCOUNTER — Ambulatory Visit: Payer: BC Managed Care – PPO | Admitting: Family Medicine

## 2022-11-02 DIAGNOSIS — C9210 Chronic myeloid leukemia, BCR/ABL-positive, not having achieved remission: Secondary | ICD-10-CM | POA: Diagnosis not present

## 2022-11-08 ENCOUNTER — Ambulatory Visit: Payer: BC Managed Care – PPO | Admitting: Hematology

## 2022-11-09 ENCOUNTER — Ambulatory Visit: Payer: BC Managed Care – PPO | Admitting: Family Medicine

## 2022-11-09 ENCOUNTER — Encounter: Payer: Self-pay | Admitting: Family Medicine

## 2022-11-09 VITALS — BP 150/100 | HR 77 | Ht 64.0 in | Wt 228.0 lb

## 2022-11-09 DIAGNOSIS — E785 Hyperlipidemia, unspecified: Secondary | ICD-10-CM

## 2022-11-09 DIAGNOSIS — E559 Vitamin D deficiency, unspecified: Secondary | ICD-10-CM

## 2022-11-09 DIAGNOSIS — I10 Essential (primary) hypertension: Secondary | ICD-10-CM | POA: Diagnosis not present

## 2022-11-09 MED ORDER — DILTIAZEM HCL ER COATED BEADS 120 MG PO CP24: 120.0000 mg | ORAL_CAPSULE | Freq: Every day | ORAL | 1 refills | Status: DC

## 2022-11-09 NOTE — Progress Notes (Signed)
Marcia Hahn     MRN: [id number]      DOB: 20-Apr-1976   HPI Ms. Steidle is here for follow up and re-evaluation of chronic medical conditions, medication management and review of any available recent lab and radiology data.  Preventive health is updated, specifically  Cancer screening and Immunization.   Taking medication consistently and following DASH eating plan, still BP remains elevated, avgs over 140/ 90 at home when checked. Does state needs to commit to exercise , and be more consistent with diet, but overall a bit perplexed regarding lack of achievibg both goals  ROS Denies recent fever or chills. Denies sinus pressure, nasal congestion, ear pain or sore throat. Denies chest congestion, productive cough or wheezing. Denies chest pains, palpitations and leg swelling Denies abdominal pain, nausea, vomiting,diarrhea or constipation.   Denies dysuria, frequency, hesitancy or incontinence. Denies joint pain, swelling and limitation in mobility. Denies headaches, seizures, numbness, or tingling. Denies depression, anxiety or insomnia. Denies skin break down or rash.   PE  BP (!) 150/100   Pulse 77   Ht '5\' 4"'$  (1.626 m)   Wt 228 lb 0.6 oz (103.4 kg)   SpO2 96%   BMI 39.14 kg/m   Patient alert and oriented and in no cardiopulmonary distress.  HEENT: No facial asymmetry, EOMI,     Neck supple .  Chest: Clear to auscultation bilaterally.  CVS: S1, S2 no murmurs, no S3.Regular rate.  ABD: Soft non tender.   Ext: No edema  MS: Adequate ROM spine, shoulders, hips and knees.  Skin: Intact, no ulcerations or rash noted.  Psych: Good eye contact, normal affect. Memory intact not anxious or depressed appearing.  CNS: CN 2-12 intact, power,  normal throughout.no focal deficits noted.   Assessment & Plan  Malignant hypertension Not adequately managed, refer [facility] Add cardizem low dose DASH diet and commitment to daily physical activity for a minimum of 30 minutes  discussed and encouraged, as a part of hypertension management. The importance of attaining a healthy weight is also discussed.     11/09/2022   10:30 AM 11/09/2022   10:29 AM 11/09/2022    9:51 AM 11/09/2022    9:49 AM 09/05/2022    3:19 PM 09/05/2022    2:49 PM 09/05/2022    2:48 PM  BP/Weight  Systolic BP 366 294 765 465 [phone]  Diastolic BP 275 96 65 84 170 90 83  Wt. (Lbs)    228.04   226.12  BMI    39.14 kg/m2   38.81 kg/m2       Morbid obesity ([place])  Patient re-educated about  the importance of commitment to a  minimum of 150 minutes of exercise per week as able.  The importance of healthy food choices with portion control discussed, as well as eating regularly and within a 12 hour window most days. The need to choose "clean , green" food 50 to 75% of the time is discussed, as well as to make water the primary drink and set a goal of 64 ounces water daily.       11/09/2022    9:49 AM 09/05/2022    2:48 PM 07/09/2022    2:01 PM  Weight /BMI  Weight 228 lb 0.6 oz 226 lb 1.9 oz 226 lb  Height '5\' 4"'$  (1.626 m) '5\' 4"'$  (1.626 m) '5\' 6"'$  (1.676 m)  BMI 39.14 kg/m2 38.81 kg/m2 36.48 kg/m2    unchange  Vitamin D deficiency Updated lab  needed .

## 2022-11-09 NOTE — Assessment & Plan Note (Signed)
Updated lab needed.  

## 2022-11-09 NOTE — Assessment & Plan Note (Signed)
Not adequately managed, refer [facility] Add cardizem low dose DASH diet and commitment to daily physical activity for a minimum of 30 minutes discussed and encouraged, as a part of hypertension management. The importance of attaining a healthy weight is also discussed.     11/09/2022   10:30 AM 11/09/2022   10:29 AM 11/09/2022    9:51 AM 11/09/2022    9:49 AM 09/05/2022    3:19 PM 09/05/2022    2:49 PM 09/05/2022    2:48 PM  BP/Weight  Systolic BP 795 583 167 425 [phone]  Diastolic BP 758 96 65 84 307 90 83  Wt. (Lbs)    228.04   226.12  BMI    39.14 kg/m2   38.81 kg/m2

## 2022-11-09 NOTE — Patient Instructions (Addendum)
F/U in 4 months, call if you need me sooner  You are referred to advanced HTN clinic , important that you go   New additional med for blood pressure is at local pharmacy, lowest dose cardizem 120 mg  Fasting lipid, cmp and EGFR, hBA1C, TSH and Vit D  in Jan at same time as you are getting labs for Oncology   It is important that you exercise regularly at least 30 minutes 5 times a week. If you develop chest pain, have severe difficulty breathing, or feel very tired, stop exercising immediately and seek medical attention   Limit calories to 1000 to 1200 per day  Thanks for choosing [facility], we consider it a privelige to serve you.

## 2022-11-09 NOTE — Assessment & Plan Note (Signed)
  Patient re-educated about  the importance of commitment to a  minimum of 150 minutes of exercise per week as able.  The importance of healthy food choices with portion control discussed, as well as eating regularly and within a 12 hour window most days. The need to choose "clean , green" food 50 to 75% of the time is discussed, as well as to make water the primary drink and set a goal of 64 ounces water daily.       11/09/2022    9:49 AM 09/05/2022    2:48 PM 07/09/2022    2:01 PM  Weight /BMI  Weight 228 lb 0.6 oz 226 lb 1.9 oz 226 lb  Height '5\' 4"'$  (1.626 m) '5\' 4"'$  (1.626 m) '5\' 6"'$  (1.676 m)  BMI 39.14 kg/m2 38.81 kg/m2 36.48 kg/m2    unchange

## 2022-11-26 ENCOUNTER — Ambulatory Visit (HOSPITAL_COMMUNITY)
Admission: RE | Admit: 2022-11-26 | Discharge: 2022-11-26 | Disposition: A | Discharge status: Home / Self Care | Payer: BC Managed Care – PPO | Source: Ambulatory Visit | Attending: Family Medicine | Admitting: Family Medicine

## 2022-11-26 DIAGNOSIS — Z1231 Encounter for screening mammogram for malignant neoplasm of breast: Secondary | ICD-10-CM | POA: Insufficient documentation

## 2022-12-04 ENCOUNTER — Inpatient Hospital Stay: Payer: BC Managed Care – PPO | Attending: Hematology | Admitting: Hematology

## 2022-12-04 ENCOUNTER — Other Ambulatory Visit (HOSPITAL_COMMUNITY): Payer: Self-pay | Admitting: *Deleted

## 2022-12-04 VITALS — BP 128/79 | HR 70 | Temp 98.5°F | Resp 17 | Ht 64.0 in | Wt 231.6 lb

## 2022-12-04 DIAGNOSIS — Z87891 Personal history of nicotine dependence: Secondary | ICD-10-CM | POA: Insufficient documentation

## 2022-12-04 DIAGNOSIS — Z833 Family history of diabetes mellitus: Secondary | ICD-10-CM | POA: Diagnosis not present

## 2022-12-04 DIAGNOSIS — Z9079 Acquired absence of other genital organ(s): Secondary | ICD-10-CM | POA: Insufficient documentation

## 2022-12-04 DIAGNOSIS — I10 Essential (primary) hypertension: Secondary | ICD-10-CM | POA: Insufficient documentation

## 2022-12-04 DIAGNOSIS — Z8249 Family history of ischemic heart disease and other diseases of the circulatory system: Secondary | ICD-10-CM | POA: Diagnosis not present

## 2022-12-04 DIAGNOSIS — Z803 Family history of malignant neoplasm of breast: Secondary | ICD-10-CM | POA: Insufficient documentation

## 2022-12-04 DIAGNOSIS — C9210 Chronic myeloid leukemia, BCR/ABL-positive, not having achieved remission: Secondary | ICD-10-CM

## 2022-12-04 DIAGNOSIS — Z888 Allergy status to other drugs, medicaments and biological substances status: Secondary | ICD-10-CM | POA: Insufficient documentation

## 2022-12-04 DIAGNOSIS — Z79899 Other long term (current) drug therapy: Secondary | ICD-10-CM | POA: Diagnosis not present

## 2022-12-04 DIAGNOSIS — E669 Obesity, unspecified: Secondary | ICD-10-CM | POA: Diagnosis not present

## 2022-12-04 DIAGNOSIS — Z823 Family history of stroke: Secondary | ICD-10-CM | POA: Diagnosis not present

## 2022-12-04 NOTE — Progress Notes (Signed)
Patient is taking Sprycel as prescribed. She has not missed any doses and reports no side effects at this time.

## 2022-12-04 NOTE — Progress Notes (Signed)
Marcia Hahn, [place] [address]   CLINIC:  Medical Oncology/Hematology  PCP:  Fayrene Helper, MD [address], [address] [place] / [place] [address]  [phone]  REASON FOR VISIT:  Follow-up for CML  PRIOR THERAPY: none  CURRENT THERAPY: Dasatinib 70 mg daily  INTERVAL HISTORY:  [name], a 48 y.o. female, seen for follow-up of chronic myeloid leukemia.  Reports energy levels 85%.  She reports that she has been taking Dasatinib without fail.  Recently her Aldactone was increased to 100 mg daily.  She also takes magnesium, beet root and iron tablet with other B vitamins.  REVIEW OF SYSTEMS:  Review of Systems  Constitutional:  Negative for appetite change and fatigue.  Respiratory:  Negative for shortness of breath.   Neurological:  Negative for numbness.  All other systems reviewed and are negative.   PAST MEDICAL/SURGICAL HISTORY:  Past Medical History:  Diagnosis Date   Abnormal mammogram of right breast 09/03/2018   Acne vulgaris    CML (chronic myelocytic leukemia) ([place])    Hypertension    Mild obesity    Past Surgical History:  Procedure Laterality Date   BREAST BIOPSY Left    CESAREAN SECTION     COLONOSCOPY WITH PROPOFOL N/A 03/17/2021   Procedure: COLONOSCOPY WITH PROPOFOL;  Surgeon: Eloise Harman, DO;  Location: [facility];  Service: Endoscopy;  Laterality: N/A;  ASA II / AM procedure   DILATATION AND CURETTAGE/HYSTEROSCOPY WITH MINERVA N/A 10/26/2020   Procedure: DILATATION AND CURETTAGE/HYSTEROSCOPY WITH MINERVA ENDOMETRIAL ABLATION;  Surgeon: Florian Buff, MD;  Location: [facility] ORS;  Service: Gynecology;  Laterality: N/A;   LAPAROSCOPIC BILATERAL SALPINGECTOMY Bilateral 10/26/2020   Procedure: LAPAROSCOPIC BILATERAL SALPINGECTOMY;  Surgeon: Florian Buff, MD;  Location: [facility] ORS;  Service: Gynecology;  Laterality: Bilateral;   POLYPECTOMY  03/17/2021   Procedure: POLYPECTOMY;  Surgeon: Eloise Harman,  DO;  Location: [facility];  Service: Endoscopy;;    SOCIAL HISTORY:  Social History   Socioeconomic History   Marital status: Married    Spouse name: Not on file   Number of children: 2   Years of education: Not on file   Highest education level: Not on file  Occupational History   Occupation: Psychologist, educational   Tobacco Use   Smoking status: Former    Types: Cigarettes    Quit date: 12/04/2012    Years since quitting: 10.0   Smokeless tobacco: Never  Vaping Use   Vaping Use: Never used  Substance and Sexual Activity   Alcohol use: Yes    Comment: occasional   Drug use: No   Sexual activity: Not Currently    Birth control/protection: Surgical    Comment: tubal and ablation  Other Topics Concern   Not on file  Social History Narrative   Mother of twins    Social Determinants of Health   Financial Resource Strain: Low Risk  (08/25/2020)   Overall Financial Resource Strain (CARDIA)    Difficulty of Paying Living Expenses: Not hard at all  Food Insecurity: No Food Insecurity (08/25/2020)   Hunger Vital Sign    Worried About Running Out of Food in the Last Year: Never true    [place] in the Last Year: Never true  Transportation Needs: No Transportation Needs (08/25/2020)   PRAPARE - Hydrologist (Medical): No    Lack of Transportation (Non-Medical): No  Physical Activity:  Inactive (08/25/2020)   Exercise Vital Sign    Days of Exercise per Week: 0 days    Minutes of Exercise per Session: 0 min  Stress: No Stress Concern Present (08/25/2020)   [place]    Feeling of Stress : Not at all  Social Connections: Moderately Integrated (08/25/2020)   Social Connection and Isolation Panel [NHANES]    Frequency of Communication with Friends and Family: More than three times a week    Frequency of Social Gatherings with Friends and Family: Once a week    Attends Religious  Services: 1 to 4 times per year    Active Member of [employer] or Organizations: No    Attends Archivist Meetings: Never    Marital Status: Married  Human resources officer Violence: Not At Risk (08/25/2020)   Humiliation, Afraid, Rape, and Kick questionnaire    Fear of Current or Ex-Partner: No    Emotionally Abused: No    Physically Abused: No    Sexually Abused: No    FAMILY HISTORY:  Family History  Problem Relation Age of Onset   GER disease Mother    Diabetes Mother    Hypertension Mother    Heart attack Father 48   Heart disease Father    Migraines Sister    Breast cancer Maternal Grandmother    Stroke Maternal Grandmother    Breast cancer Cousin    Stroke Maternal Aunt    Colon cancer Neg Hx     CURRENT MEDICATIONS:  Current Outpatient Medications  Medication Sig Dispense Refill   dasatinib (SPRYCEL) 70 MG tablet TAKE 1 TABLET BY MOUTH ONCE DAILY AT THE SAME TIME. MAY TAKE WITH OR WITHOUT FOOD. SWALLOW WHOLE. AVOID GRAPEFRUIT PRODUCTS. 30 tablet 6   diltiazem (CARDIZEM CD) 120 MG 24 hr capsule Take 1 capsule (120 mg total) by mouth daily. 30 capsule 1   loratadine (CLARITIN) 10 MG tablet TAKE 1 TABLET(10 MG) BY MOUTH DAILY 90 tablet 1   losartan (COZAAR) 100 MG tablet Take 1 tablet (100 mg total) by mouth daily. 90 tablet 0   Multiple Vitamins-Minerals (MULTIVITAMIN WITH MINERALS) tablet Take 1 tablet by mouth at bedtime.     spironolactone (ALDACTONE) 100 MG tablet Take 1 tablet (100 mg total) by mouth daily. 30 tablet 2   No current facility-administered medications for this visit.    ALLERGIES:  Allergies  Allergen Reactions   Amlodipine     Headache    Tomato Itching and Swelling    Processed tomatoes in the can.     PHYSICAL EXAM:  Performance status (ECOG): 1 - Symptomatic but completely ambulatory  Vitals:   12/04/22 1414  BP: 128/79  Pulse: 70  Resp: 17  Temp: 98.5 F (36.9 C)  SpO2: 100%   Wt Readings from Last 3 Encounters:  12/04/22 231  lb 9.6 oz (105.1 kg)  11/09/22 228 lb 0.6 oz (103.4 kg)  09/05/22 226 lb 1.9 oz (102.6 kg)   Physical Exam Vitals reviewed.  Constitutional:      Appearance: Normal appearance. She is obese.  Cardiovascular:     Rate and Rhythm: Normal rate and regular rhythm.     Pulses: Normal pulses.     Heart sounds: Normal heart sounds.  Pulmonary:     Effort: Pulmonary effort is normal.     Breath sounds: Normal breath sounds.  Abdominal:     Palpations: Abdomen is soft. There is no hepatomegaly, splenomegaly or mass.  Tenderness: There is no abdominal tenderness.  Musculoskeletal:     Right lower leg: No edema.     Left lower leg: No edema.  Neurological:     General: No focal deficit present.     Mental Status: She is alert and oriented to person, place, and time.  Psychiatric:        Mood and Affect: Mood normal.        Behavior: Behavior normal.     LABORATORY DATA:  I have reviewed the labs as listed.     Latest Ref Rng & Units 10/09/2021    2:48 PM 06/27/2021    3:40 PM 03/22/2021   10:36 AM  CBC  WBC 4.0 - 10.5 K/uL 6.8  6.7  5.8   Hemoglobin 12.0 - 15.0 g/dL 11.8  11.8  12.4   Hematocrit 36.0 - 46.0 % 35.7  36.4  38.1   Platelets 150 - 400 K/uL 261  300  256       Latest Ref Rng & Units 10/09/2021    2:48 PM 06/27/2021    3:40 PM 03/22/2021   10:36 AM  CMP  Glucose 70 - 99 mg/dL 84  94  97   BUN 6 - 20 mg/dL '17  14  16   '$ Creatinine 0.44 - 1.00 mg/dL 1.04  1.15  0.86   Sodium 135 - 145 mmol/L 139  136  136   Potassium 3.5 - 5.1 mmol/L 3.5  3.6  3.5   Chloride 98 - 111 mmol/L 106  106  104   CO2 22 - 32 mmol/L '26  27  25   '$ Calcium 8.9 - 10.3 mg/dL 8.5  8.4  8.6   Total Protein 6.5 - 8.1 g/dL 6.8  6.9  7.1   Total Bilirubin 0.3 - 1.2 mg/dL 0.2  0.6  0.4   Alkaline Phos 38 - 126 U/L 54  62  46   AST 15 - 41 U/L 36  27  22   ALT 0 - 44 U/L 41  43  21       Component Value Date/Time   RBC 3.76 (L) 10/09/2021 1448   MCV 94.9 10/09/2021 1448   MCH 31.4  10/09/2021 1448   MCHC 33.1 10/09/2021 1448   RDW 12.3 10/09/2021 1448   LYMPHSABS 1.7 10/09/2021 1448   MONOABS 0.4 10/09/2021 1448   EOSABS 0.1 10/09/2021 1448   BASOSABS 0.0 10/09/2021 1448       DIAGNOSTIC IMAGING:  I have independently reviewed the scans and discussed with the patient. MM 3D SCREEN BREAST BILATERAL  Result Date: 11/27/2022 CLINICAL DATA:  Screening. EXAM: DIGITAL SCREENING BILATERAL MAMMOGRAM WITH TOMOSYNTHESIS AND CAD TECHNIQUE: Bilateral screening digital craniocaudal and mediolateral oblique mammograms were obtained. Bilateral screening digital breast tomosynthesis was performed. The images were evaluated with computer-aided detection. COMPARISON:  Previous exam(s). ACR Breast Density Category c: The breast tissue is heterogeneously dense, which may obscure small masses. FINDINGS: There are no findings suspicious for malignancy. IMPRESSION: No mammographic evidence of malignancy. A result letter of this screening mammogram will be mailed directly to the patient. RECOMMENDATION: Screening mammogram in one year. (Code:SM-B-01Y) BI-RADS CATEGORY  1: Negative. Electronically Signed   By: Marin Olp M.D.   On: 11/27/2022 16:30     ASSESSMENT:  1.  CML in chronic phase: -Presentation with hyperleukocytosis.  BCR/ABL by FISH positive. -CT chest PE protocol showed splenomegaly measuring 14 cm. -BMBX on 02/23/2020 shows hypercellular marrow with myeloid hyperplasia consistent with  CML.  No increase in blasts.  Flow cytometry less than 1% blasts.  Chromosome analysis shows Philadelphia chromosome. -Dasatinib 70 mg daily started on 04/01/2020. -BCR/ABL by quantitative PCR on 06/30/2020 shows B3 A2 transcript improved to 2.19 from 128 previously.  B2 A2 and E1 A2 transcripts are undetectable. -BCR/ABL by quantitative PCR on 11/21/2020 -. - BCR/ABL from 03/22/2021 was negative. - BCR/ABL on 06/27/2021 was undetectable. - BCR/ABL on 01/31/2022 ([facility])-0.037% - BCR/ABL (P210  fusion transcript) on 06/19/2022-0.004% - [facility] BCR/ABL PCR on 11/02/2022: P210 transcript at 1.532%.  PLAN:  1.  CML in chronic phase: - She is tolerating Dasatinib 70 mg daily very well. - Physical examination did not reveal any palpable masses or splenomegaly. - [facility] BCR/ABL PCR on 06/19/2022: P210 transcript at 0.004%. - [facility] BCR/ABL PCR on 11/02/2022: P210 transcript at 1.532%. - She reports that she has not missed any doses.  PCR level has gone up in March and came down in August last year. - I have recommended continuing same dose of Dasatinib.  I plan to repeat BCR/ABL by PCR in 2 months.  If it continues to go high, will consider tyrosine kinase domain mutation testing and switching therapy. - I have also reviewed other labs which showed AST elevated at 114, ALT at 195, previously also elevated.  Will closely monitor.   2.  Hypertension: - Continue spironolactone 100 mg daily and losartan 100 mg daily.  Blood pressure today is 128/79.   3.  Normocytic anemia: - Mild normocytic anemia with hemoglobin 11.3 stable.  Orders placed this encounter:  Orders Placed This Encounter  Procedures   CBC with Differential   Comprehensive metabolic panel   Lactate dehydrogenase   BCR-ABL1, CML/ALL, PCR, QUANT      Derek Jack, MD [facility] [phone]

## 2022-12-04 NOTE — Patient Instructions (Addendum)
[place]  Discharge Instructions  You were seen and examined today by Dr. Delton Coombes.  Your leukemia numbers increased significantly, which is strange because they were recent undetectable in August and have never been this high.   Continue Sprycel as prescribed.  Follow-up as scheduled.  Thank you for choosing [facility] to provide your oncology and hematology care.   To afford each patient quality time with our provider, please arrive at least 15 minutes before your scheduled appointment time. You may need to reschedule your appointment if you arrive late (10 or more minutes). Arriving late affects you and other patients whose appointments are after yours.  Also, if you miss three or more appointments without notifying the office, you may be dismissed from the clinic at the provider's discretion.    Again, thank you for choosing [facility].  Our hope is that these requests will decrease the amount of time that you wait before being seen by our physicians.   If you have a lab appointment with the [facility] please come in thru the Main Entrance and check in at the main information desk.           _____________________________________________________________  Should you have questions after your visit to [facility], please contact our office at [phone] and follow the prompts.  Our office hours are 8:00 a.m. to 4:30 p.m. Monday - Thursday and 8:00 a.m. to 2:30 p.m. Friday.  Please note that voicemails left after 4:00 p.m. may not be returned until the following business day.  We are closed weekends and all major holidays.  You do have access to a nurse 24-7, just call the main number to the clinic [phone] and do not press any options, hold on the line and a nurse will answer the phone.    For prescription refill requests, have your pharmacy contact our office and allow 72 hours.    Masks are  optional in the cancer centers. If you would like for your care team to wear a mask while they are taking care of you, please let them know. You may have one support person who is at least 47 years old accompany you for your appointments.

## 2022-12-05 ENCOUNTER — Other Ambulatory Visit: Payer: Self-pay | Admitting: Family Medicine

## 2022-12-06 ENCOUNTER — Other Ambulatory Visit: Payer: Self-pay | Admitting: Family Medicine

## 2022-12-06 DIAGNOSIS — I10 Essential (primary) hypertension: Secondary | ICD-10-CM

## 2022-12-07 ENCOUNTER — Telehealth: Payer: Self-pay

## 2022-12-07 ENCOUNTER — Other Ambulatory Visit (HOSPITAL_COMMUNITY): Payer: Self-pay

## 2022-12-07 NOTE — Telephone Encounter (Signed)
Patient's particular insurance plan would not allow the use of CoverMyMeds for Prior Auth.  Faxed manual Prior Auth to [facility] at [phone].  PA Case Number: [id number]  Status is pending.   Marcia Hahn, [facility] Patient [place]  [phone] (phone) [phone] (fax) 12/07/2022 3:36 PM

## 2022-12-07 NOTE — Telephone Encounter (Signed)
Oral Oncology Patient Advocate Encounter   Received notification that prior authorization for Sprycel is due for renewal.   PA submitted on 12/07/22  Key [id number]  Status is pending     Marcia Hahn, [place] Patient [place]  [phone] (phone) [phone] (fax) 12/07/2022 1:53 PM

## 2022-12-10 ENCOUNTER — Other Ambulatory Visit (HOSPITAL_COMMUNITY): Payer: Self-pay

## 2022-12-10 NOTE — Telephone Encounter (Signed)
Oral Oncology Patient Advocate Encounter  Prior Authorization for Sprycel has been approved.    Effective dates: 12/08/22 through 12/09/23.  Patient must fill through [facility]. They would not give me a cost in my test claim.    Berdine Addison, [facility] Patient [place]  [phone] (phone) [phone] (fax) 12/10/2022 9:09 AM

## 2022-12-13 ENCOUNTER — Ambulatory Visit (HOSPITAL_BASED_OUTPATIENT_CLINIC_OR_DEPARTMENT_OTHER): Payer: BC Managed Care – PPO | Admitting: Family

## 2022-12-13 VITALS — BP 126/70 | HR 78 | Ht 64.0 in | Wt 227.8 lb

## 2022-12-13 DIAGNOSIS — R0683 Snoring: Secondary | ICD-10-CM | POA: Diagnosis not present

## 2022-12-13 DIAGNOSIS — I10 Essential (primary) hypertension: Secondary | ICD-10-CM

## 2022-12-13 NOTE — Patient Instructions (Addendum)
Medication Instructions:  Your Physician recommend you continue on your current medication as directed.    Testing/Procedures: Your physician has requested that you have a renal artery duplex. During this test, an ultrasound is used to evaluate blood flow to the kidneys. Allow one hour for this exam. Do not eat after midnight the day before and avoid carbonated beverages. Take your medications as you usually do.    Follow-Up: April 25th at 4pm with Dr. Oval Linsey at [facility]     Referrals:  We have referred you to the PREP program, Zacarias Pontes or Rise Paganini will reach out to get you started!    Special Instructions:  WatchPAT?  Is a FDA cleared portable home sleep study test that uses a watch and 3 points of contact to monitor 7 different channels, including your heart rate, oxygen saturations, body position, snoring, and chest motion.  The study is easy to use from the comfort of your own home and accurately detect sleep apnea.  Before bed, you attach the chest sensor, attached the sleep apnea bracelet to your nondominant hand, and attach the finger probe.  After the study, the raw data is downloaded from the watch and scored for apnea events.   For more information: [email or web address]  Patient Testing Instructions:  Do not put battery into the device until bedtime when you are ready to begin the test. Please call the support number if you need assistance after following the instructions below: 24 hour support line- [phone] or [facility] support at [phone] (option 2)  Download the The First AmericanWatchPAT One" app through the google play store or App Store  Be sure to turn on or enable access to bluetooth in settlings on your smartphone/ device  Make sure no other bluetooth devices are on and within the vicinity of your smartphone/ device and WatchPAT watch during testing.  Make sure to leave your smart phone/ device plugged in and charging all night.  When ready for bed:   Follow the instructions step by step in the WatchPAT One App to activate the testing device. For additional instructions, including video instruction, visit the WatchPAT One video on Youtube. You can search for [place] One within Youtube (video is 4 minutes and 18 seconds) or enter: https://youtube/watch?[email or web address]_vbiwxE Please note: You will be prompted to enter a Pin to connect via bluetooth when starting the test. The PIN will be assigned to you when you receive the test.  The device is disposable, but it recommended that you retain the device until you receive a call letting you know the study has been received and the results have been interpreted.  We will let you know if the study did not transmit to [place] properly after the test is completed. You do not need to call us to confirm the receipt of the test.  Please complete the test within 48 hours of receiving PIN.   Frequently Asked Questions:  What is Watch [place] one?  A single use fully disposable home sleep apnea testing device and will not need to be returned after completion.  What are the requirements to use WatchPAT one?  The be able to have a successful watchpat one sleep study, you should have your Watch pat one device, your smart phone, watch pat one app, your PIN number and Internet access What type of phone do I need?  You should have a smart phone that uses Android 5.1 and above or any Iphone with IOS 10 and above How can I download the WatchPAT one app?  Based on your device type search for WatchPAT one app either in google play for android devices or APP store for Iphone's Where will I get my PIN for the study?  Your PIN will be provided by your physician's office. It is used for authentication and if you lose/forget your PIN, please reach out to your providers office.  I do not have Internet at home. Can I do WatchPAT one study?  WatchPAT One needs Internet connection throughout the night to be able to transmit the sleep data. You  can use your home/local internet or your cellular's data package. However, it is always recommended to use home/local Internet. It is estimated that between 20MB-30MB will be used with each study.However, the application will be looking for 80MB space in the phone to start the study.  What happens if I lose internet or bluetooth connection?  During the internet disconnection, your phone will not be able to transmit the sleep data. All the data, will be stored in your phone. As soon as the internet connection is back on, the phone will being sending the sleep data. During the bluetooth disconnection, WatchPAT one will not be able to to send the sleep data to your phone. Data will be kept in the [facility] one until two devices have bluetooth connection back on. As soon as the connection is back on, WatchPAT one will send the sleep data to the phone.  How long do I need to wear the WatchPAT one?  After you start the study, you should wear the device at least 6 hours.  How far should I keep my phone from the device?  During the night, your phone should be within 15 feet.  What happens if I leave the room for restroom or other reasons?  Leaving the room for any reason will not cause any problem. As soon as your get back to the room, both devices will reconnect and will continue to send the sleep data. Can I use my phone during the sleep study?  Yes, you can use your phone as usual during the study. But it is recommended to put your watchpat one on when you are ready to go to bed.  How will I get my study results?  A soon as you completed your study, your sleep data will be sent to the provider. They will then share the results with you when they are ready.    DASH Eating Plan DASH stands for Dietary Approaches to Stop Hypertension. The DASH eating plan is a healthy eating plan that has been shown to: Reduce high blood pressure (hypertension). Reduce your risk for type 2 diabetes, heart disease, and  stroke. Help with weight loss. What are tips for following this plan? Reading food labels Check food labels for the amount of salt (sodium) per serving. Choose foods with less than 5 percent of the Daily Value of sodium. Generally, foods with less than 300 milligrams (mg) of sodium per serving fit into this eating plan. To find whole grains, look for the word "whole" as the first word in the ingredient list. Shopping Buy products labeled as "low-sodium" or "no salt added." Buy fresh foods. Avoid canned foods and pre-made or frozen meals. Cooking Avoid adding salt when cooking. Use salt-free seasonings or herbs instead of table salt or sea salt. Check with your health care provider or pharmacist before using salt substitutes. Do not fry foods. Cook foods using healthy methods such as baking, boiling, grilling, roasting, and broiling instead. Lacinda Axon  with heart-healthy oils, such as olive, canola, avocado, soybean, or sunflower oil. Meal planning  Eat a balanced diet that includes: 4 or more servings of fruits and 4 or more servings of vegetables each day. Try to fill one-half of your plate with fruits and vegetables. 6-8 servings of whole grains each day. Less than 6 oz (170 g) of lean meat, poultry, or fish each day. A 3-oz (85-g) serving of meat is about the same size as a deck of cards. One egg equals 1 oz (28 g). 2-3 servings of low-fat dairy each day. One serving is 1 cup (237 mL). 1 serving of nuts, seeds, or beans 5 times each week. 2-3 servings of heart-healthy fats. Healthy fats called omega-3 fatty acids are found in foods such as walnuts, flaxseeds, fortified milks, and eggs. These fats are also found in cold-water fish, such as sardines, salmon, and mackerel. Limit how much you eat of: Canned or prepackaged foods. Food that is high in trans fat, such as some fried foods. Food that is high in saturated fat, such as fatty meat. Desserts and other sweets, sugary drinks, and other foods  with added sugar. Full-fat dairy products. Do not salt foods before eating. Do not eat more than 4 egg yolks a week. Try to eat at least 2 vegetarian meals a week. Eat more home-cooked food and less restaurant, buffet, and fast food. Lifestyle When eating at a restaurant, ask that your food be prepared with less salt or no salt, if possible. If you drink alcohol: Limit how much you use to: 0-1 drink a day for women who are not pregnant. 0-2 drinks a day for men. Be aware of how much alcohol is in your drink. In the [place] one drink equals one 12 oz bottle of beer (355 mL), one 5 oz glass of wine (148 mL), or one 1 oz glass of hard liquor (44 mL). General information Avoid eating more than 2,300 mg of salt a day. If you have hypertension, you may need to reduce your sodium intake to 1,500 mg a day. Work with your health care provider to maintain a healthy body weight or to lose weight. Ask what an ideal weight is for you. Get at least 30 minutes of exercise that causes your heart to beat faster (aerobic exercise) most days of the week. Activities may include walking, swimming, or biking. Work with your health care provider or dietitian to adjust your eating plan to your individual calorie needs. What foods should I eat? Fruits All fresh, dried, or frozen fruit. Canned fruit in natural juice (without added sugar). Vegetables Fresh or frozen vegetables (raw, steamed, roasted, or grilled). Low-sodium or reduced-sodium tomato and vegetable juice. Low-sodium or reduced-sodium tomato sauce and tomato paste. Low-sodium or reduced-sodium canned vegetables. Grains Whole-grain or whole-wheat bread. Whole-grain or whole-wheat pasta. Brown rice. [place]. Bulgur. Whole-grain and low-sodium cereals. Pita bread. Low-fat, low-sodium crackers. Whole-wheat flour tortillas. Meats and other proteins Skinless chicken or [place]. Ground chicken or [place]. Pork with fat trimmed off. Fish and seafood. Egg  whites. Dried beans, peas, or lentils. Unsalted nuts, nut butters, and seeds. Unsalted canned beans. Lean cuts of beef with fat trimmed off. Low-sodium, lean precooked or cured meat, such as sausages or meat loaves. Dairy Low-fat (1%) or fat-free (skim) milk. Reduced-fat, low-fat, or fat-free cheeses. Nonfat, low-sodium ricotta or cottage cheese. Low-fat or nonfat yogurt. Low-fat, low-sodium cheese. Fats and oils Soft margarine without trans fats. Vegetable oil. Reduced-fat, low-fat, or light mayonnaise and  salad dressings (reduced-sodium). Canola, safflower, olive, avocado, soybean, and sunflower oils. Avocado. Seasonings and condiments Herbs. Spices. Seasoning mixes without salt. Other foods Unsalted popcorn and pretzels. Fat-free sweets. The items listed above may not be a complete list of foods and beverages you can eat. Contact a dietitian for more information. What foods should I avoid? Fruits Canned fruit in a light or heavy syrup. Fried fruit. Fruit in cream or butter sauce. Vegetables Creamed or fried vegetables. Vegetables in a cheese sauce. Regular canned vegetables (not low-sodium or reduced-sodium). Regular canned tomato sauce and paste (not low-sodium or reduced-sodium). Regular tomato and vegetable juice (not low-sodium or reduced-sodium). Angie Fava. Olives. Grains Baked goods made with fat, such as croissants, muffins, or some breads. Dry pasta or rice meal packs. Meats and other proteins Fatty cuts of meat. Ribs. Fried meat. Berniece Salines. Bologna, salami, and other precooked or cured meats, such as sausages or meat loaves. Fat from the back of a pig (fatback). Bratwurst. Salted nuts and seeds. Canned beans with added salt. Canned or smoked fish. Whole eggs or egg yolks. Chicken or [place] with skin. Dairy Whole or 2% milk, cream, and half-and-half. Whole or full-fat cream cheese. Whole-fat or sweetened yogurt. Full-fat cheese. Nondairy creamers. Whipped toppings. Processed cheese and  cheese spreads. Fats and oils Butter. Stick margarine. Lard. Shortening. Ghee. Bacon fat. Tropical oils, such as coconut, palm kernel, or palm oil. Seasonings and condiments Onion salt, garlic salt, seasoned salt, table salt, and sea salt. Worcestershire sauce. Tartar sauce. Barbecue sauce. Teriyaki sauce. Soy sauce, including reduced-sodium. Steak sauce. Canned and packaged gravies. Fish sauce. Oyster sauce. Cocktail sauce. Store-bought horseradish. Ketchup. Mustard. Meat flavorings and tenderizers. Bouillon cubes. Hot sauces. Pre-made or packaged marinades. Pre-made or packaged taco seasonings. Relishes. Regular salad dressings. Other foods Salted popcorn and pretzels. The items listed above may not be a complete list of foods and beverages you should avoid. Contact a dietitian for more information. Where to find more information National Heart, Lung, and Blood Institute: https://Kathelene Rumberger-eaton.com/ American Heart Association: www.heart.org Academy of Nutrition and Dietetics: www.eatright.[place]: www.kidney.org Summary The DASH eating plan is a healthy eating plan that has been shown to reduce high blood pressure (hypertension). It may also reduce your risk for type 2 diabetes, heart disease, and stroke. When on the DASH eating plan, aim to eat more fresh fruits and vegetables, whole grains, lean proteins, low-fat dairy, and heart-healthy fats. With the DASH eating plan, you should limit salt (sodium) intake to 2,300 mg a day. If you have hypertension, you may need to reduce your sodium intake to 1,500 mg a day. Work with your health care provider or dietitian to adjust your eating plan to your individual calorie needs. This information is not intended to replace advice given to you by your health care provider. Make sure you discuss any questions you have with your health care provider. Document Revised: 10/02/2019 Document Reviewed: 10/02/2019 Elsevier Patient Education  [place].

## 2022-12-13 NOTE — Progress Notes (Signed)
Advanced Hypertension Clinic Initial Assessment:    Date:  12/14/2022   ID:  Marcia Hahn, DOB 30-Jan-1976, MRN [id number]  PCP:  Fayrene Helper, MD  Cardiologist:  None  Nephrologist:  Referring MD: Fayrene Helper, MD   CC: Hypertension  History of Present Illness:    Marcia Hahn is a 47 y.o. female with a hx of hypertension, CML, HLD here to establish care in the Advanced Hypertension Clinic. Family history notable for coronary artery disease.  Has previously followed with Dr. Harrington Challenger, last seen 12/11/19. Prior carotid doppler 2013 normal. Amlodipine previously stopped due to edema and headache. Hydralazine previously started and stopped. When she initially saw Dr. Harrington Challenger 11/04/19, Losartan was added. At visit 12/11/19 Losartan increased to '100mg'$  and Maxide decreased to half tablet QD.  She saw primary care 11/09/22 and was referred to Advanced Hypertension Clinic due to uncontrolled blood pressure. Presents today to establish care. Pleasant lady with adult twins who are 47 years old. Works as Chief of Staff, predominantly sedentary role. Azadeh L Haueter was diagnosed with hypertension in her late 60s or early 75s. It has been difficult to control. Blood pressure checked with arm cuff at home. Previous difficulty with Amlodipine with some decreased energy, headache. BP at home 128/68. she reports tobacco use >10 years ago and was only social. Alcohol use socially. For exercise she no formal routine. she eats at home and outside of the home and does follow low sodium diet. Has been working to adjust her diet and include more juicing. Does note that she snores at night.   Previous antihypertensives: Amlodipine - headache Hydralazine  Clonidine  Past Medical History:  Diagnosis Date   Abnormal mammogram of right breast 09/03/2018   Acne vulgaris    CML (chronic myelocytic leukemia) ([place])    Hypertension    Mild obesity     Past Surgical History:  Procedure  Laterality Date   BREAST BIOPSY Left    CESAREAN SECTION     COLONOSCOPY WITH PROPOFOL N/A 03/17/2021   Procedure: COLONOSCOPY WITH PROPOFOL;  Surgeon: Eloise Harman, DO;  Location: [facility];  Service: Endoscopy;  Laterality: N/A;  ASA II / AM procedure   DILATATION AND CURETTAGE/HYSTEROSCOPY WITH MINERVA N/A 10/26/2020   Procedure: DILATATION AND CURETTAGE/HYSTEROSCOPY WITH MINERVA ENDOMETRIAL ABLATION;  Surgeon: Florian Buff, MD;  Location: [facility] ORS;  Service: Gynecology;  Laterality: N/A;   LAPAROSCOPIC BILATERAL SALPINGECTOMY Bilateral 10/26/2020   Procedure: LAPAROSCOPIC BILATERAL SALPINGECTOMY;  Surgeon: Florian Buff, MD;  Location: [facility] ORS;  Service: Gynecology;  Laterality: Bilateral;   POLYPECTOMY  03/17/2021   Procedure: POLYPECTOMY;  Surgeon: Eloise Harman, DO;  Location: [facility];  Service: Endoscopy;;    Current Medications: Current Meds  Medication Sig   dasatinib (SPRYCEL) 70 MG tablet TAKE 1 TABLET BY MOUTH ONCE DAILY AT THE SAME TIME. MAY TAKE WITH OR WITHOUT FOOD. SWALLOW WHOLE. AVOID GRAPEFRUIT PRODUCTS.   diltiazem (CARDIZEM CD) 120 MG 24 hr capsule TAKE 1 CAPSULE BY MOUTH EVERY DAY   loratadine (CLARITIN) 10 MG tablet TAKE 1 TABLET(10 MG) BY MOUTH DAILY   losartan (COZAAR) 100 MG tablet Take 1 tablet (100 mg total) by mouth daily.   Multiple Vitamins-Minerals (MULTIVITAMIN WITH MINERALS) tablet Take 1 tablet by mouth at bedtime.   spironolactone (ALDACTONE) 100 MG tablet TAKE 1 TABLET BY MOUTH EVERY DAY     Allergies:   Amlodipine and Tomato   Social History   Socioeconomic History  Marital status: Married    Spouse name: Not on file   Number of children: 2   Years of education: Not on file   Highest education level: Not on file  Occupational History   Occupation: Psychologist, educational   Tobacco Use   Smoking status: Former    Types: Cigarettes    Quit date: 12/04/2012    Years since quitting: 10.0   Smokeless tobacco: Never  Vaping Use   Vaping  Use: Never used  Substance and Sexual Activity   Alcohol use: Yes    Comment: occasional   Drug use: No   Sexual activity: Not Currently    Birth control/protection: Surgical    Comment: tubal and ablation  Other Topics Concern   Not on file  Social History Narrative   Mother of twins    Social Determinants of Health   Financial Resource Strain: Low Risk  (08/25/2020)   Overall Financial Resource Strain (CARDIA)    Difficulty of Paying Living Expenses: Not hard at all  Food Insecurity: No Food Insecurity (08/25/2020)   Hunger Vital Sign    Worried About Running Out of Food in the Last Year: Never true    [place] in the Last Year: Never true  Transportation Needs: No Transportation Needs (08/25/2020)   PRAPARE - Hydrologist (Medical): No    Lack of Transportation (Non-Medical): No  Physical Activity: Inactive (08/25/2020)   Exercise Vital Sign    Days of Exercise per Week: 0 days    Minutes of Exercise per Session: 0 min  Stress: No Stress Concern Present (08/25/2020)   [place]    Feeling of Stress : Not at all  Social Connections: Moderately Integrated (08/25/2020)   Social Connection and Isolation Panel [NHANES]    Frequency of Communication with Friends and Family: More than three times a week    Frequency of Social Gatherings with Friends and Family: Once a week    Attends Religious Services: 1 to 4 times per year    Active Member of [employer] or Organizations: No    Attends Music therapist: Never    Marital Status: Married     Family History: The patient's family history includes Breast cancer in her cousin and maternal grandmother; Diabetes in her mother; GER disease in her mother; Heart attack (age of onset: 7) in her father; Heart disease in her father; Hypertension in her mother; Migraines in her sister; Stroke in her maternal aunt and maternal  grandmother. There is no history of Colon cancer.  ROS:   Please see the history of present illness.    All other systems reviewed and are negative.  EKGs/Labs/Other Studies Reviewed:    EKG:  EKG is ordered today.  The ekg ordered today demonstrates NSR 78 bpm with no acute St/T wave changes.  Recent Labs: No results found for requested labs within last 365 days.   Recent Lipid Panel    Component Value Date/Time   CHOL 159 11/21/2021 1120   TRIG 130 11/21/2021 1120   HDL 40 11/21/2021 1120   CHOLHDL 4.0 11/21/2021 1120   CHOLHDL 3.8 06/30/2020 0841   VLDL 18 06/30/2020 0841   LDLCALC 96 11/21/2021 1120   LDLCALC 85 10/07/2019 1021    Physical Exam:   VS:  BP 126/70   Pulse 78   Ht '5\' 4"'$  (1.626 m)   Wt 227 lb 12.8 oz (103.3 kg)  BMI 39.10 kg/m  , BMI Body mass index is 39.1 kg/m. GENERAL:  Well appearing HEENT: Pupils equal round and reactive, fundi not visualized, oral mucosa unremarkable NECK:  No jugular venous distention, waveform within normal limits, carotid upstroke brisk and symmetric, no bruits, no thyromegaly LYMPHATICS:  No cervical adenopathy LUNGS:  Clear to auscultation bilaterally HEART:  RRR.  PMI not displaced or sustained,S1 and S2 within normal limits, no S3, no S4, no clicks, no rubs, no murmurs ABD:  Flat, positive bowel sounds normal in frequency in pitch, no bruits, no rebound, no guarding, no midline pulsatile mass, no hepatomegaly, no splenomegaly EXT:  2 plus pulses throughout, no edema, no cyanosis no clubbing SKIN:  No rashes no nodules NEURO:  Cranial nerves II through XII grossly intact, motor grossly intact throughout PSYCH:  Cognitively intact, oriented to person place and time  ASSESSMENT/PLAN:    HTN - BP is well controlled on present regimen Diltaizem '120mg'$  QD, Losartan '100mg'$  QD, Spironolactone '100mg'$  QD. However, she is interested in being on fewer medications. Will proceed with secondary work up detailed below as well as refer to  PREP exercise program for lifestyle changes to reduce BP.  Plan for renal duplex to rule out stenosis as contributory to hypertension.  Sleep study, as below  Snores / Sleep disordered breathing - STOP Bang 5. [facility] Watch Pat home sleep study provided in clinic.    Screening for Secondary Hypertension:     12/14/2022    6:14 PM  Causes  Renovascular HTN Screened  Sleep Apnea Screened  Thyroid Disease Screened  Hyperaldosteronism Not Screened  Pheochromocytoma Not Screened  Cushing's Syndrome Not Screened  Hyperparathyroidism Not Screened  Coarctation of the Aorta Screened     - Comments BP symmetrical    Relevant Labs/Studies:    Latest Ref Rng & Units 10/09/2021    2:48 PM 06/27/2021    3:40 PM 03/22/2021   10:36 AM  Basic Labs  Sodium 135 - 145 mmol/L 139  136  136   Potassium 3.5 - 5.1 mmol/L 3.5  3.6  3.5   Creatinine 0.44 - 1.00 mg/dL 1.04  1.15  0.86        Latest Ref Rng & Units 11/21/2021   11:20 AM 06/30/2020    8:41 AM  Thyroid   TSH 0.450 - 4.500 uIU/mL 2.160  1.523                 12/13/2022    8:54 AM  Renovascular   Renal Artery [place] Completed Yes      she  interested in enrolling in the PREP exercise and nutrition program through the [facility].     Disposition:    FU with MD/PharmD in 2 months    Medication Adjustments/Labs and Tests Ordered: Current medicines are reviewed at length with the patient today.  Concerns regarding medicines are outlined above.  Orders Placed This Encounter  Procedures   Amb Referral To Provider Referral Exercise Program (P.R.E.P)   EKG 12-Lead   [facility] Sleep Study   VAS US RENAL ARTERY DUPLEX   No orders of the defined types were placed in this encounter.    Signed, Loel Dubonnet, NP  12/14/2022 6:14 PM    [place]

## 2022-12-14 ENCOUNTER — Encounter (HOSPITAL_BASED_OUTPATIENT_CLINIC_OR_DEPARTMENT_OTHER): Payer: Self-pay | Admitting: Family

## 2022-12-14 ENCOUNTER — Telehealth: Payer: Self-pay | Admitting: *Deleted

## 2022-12-14 ENCOUNTER — Encounter (HOSPITAL_BASED_OUTPATIENT_CLINIC_OR_DEPARTMENT_OTHER): Payer: Self-pay

## 2022-12-14 NOTE — Telephone Encounter (Signed)
Contacted regarding PREP Class referral. She is interested in participating however at this time there are no classes available to accommodate her work schedule. She needs an evening class not before 1800. If a class becomes available we will contact her.

## 2022-12-14 NOTE — Telephone Encounter (Signed)
Prior Authorization for [employer] sent to [place] via web portal. Per web portal no PA is required. Secure chat message sent to [place] ok to activate.

## 2022-12-19 ENCOUNTER — Encounter (INDEPENDENT_AMBULATORY_CARE_PROVIDER_SITE_OTHER): Payer: BC Managed Care – PPO | Admitting: Cardiology

## 2022-12-19 DIAGNOSIS — G4733 Obstructive sleep apnea (adult) (pediatric): Secondary | ICD-10-CM

## 2022-12-20 ENCOUNTER — Ambulatory Visit: Payer: BC Managed Care – PPO | Attending: Family

## 2022-12-20 DIAGNOSIS — R0683 Snoring: Secondary | ICD-10-CM

## 2022-12-20 DIAGNOSIS — I10 Essential (primary) hypertension: Secondary | ICD-10-CM

## 2022-12-20 NOTE — Procedures (Signed)
SLEEP STUDY REPORT Patient Information Study Date: 12/19/2022 Patient Name: Marcia Hahn Patient ID: [id number] Birth Date: July 26, 1976 Age: 47 Gender: Female BMI: 38.8 (W=227 lb, H=5' 4'') Stop Bang: 5 Referring Physician: Laurann Montana, NP  TEST DESCRIPTION: Home sleep apnea testing was completed using the WatchPat, a Type 1 device, utilizing peripheral arterial tonometry (PAT), chest movement, actigraphy, pulse oximetry, pulse rate, body position and snore. AHI was calculated with apnea and hypopnea using valid sleep time as the denominator. RDI includes apneas, hypopneas, and RERAs. The data acquired and the scoring of sleep and all associated events were performed in accordance with the recommended standards and specifications as outlined in the AASM Manual for the Scoring of Sleep and Associated Events 2.2.0 (2015).   FINDINGS:   1. Mild Obstructive Sleep Apnea with AHI 8.3/hr.   2. No Central Sleep Apnea with pAHIc 0/hr.   3. Oxygen desaturations as low as 77%.   4. Moderate snoring was present. O2 sats were < 88% for 3 min.   5. Total sleep time was 7 hrs and 20 min.   6. 29.7% of total sleep time was spent in REM sleep.   7. Normal sleep onset latency at 20 min.   8. Shortened REM sleep onset latency at 60 min.   9. Total awakenings were 3.  10. Arrhythmia detection:  None.  DIAGNOSIS: Mild Obstructive Sleep Apnea (G47.33)  RECOMMENDATIONS:   1.  Clinical correlation of these findings is necessary.  The decision to treat obstructive sleep apnea (OSA) is usually based on the presence of apnea symptoms or the presence of associated medical conditions such as Hypertension, Congestive Heart Failure, Atrial Fibrillation or Obesity.  The most common symptoms of OSA are snoring, gasping for breath while sleeping, daytime sleepiness and fatigue.   2.  Initiating apnea therapy is recommended given the presence of symptoms and/or associated conditions. Recommend  proceeding with one of the following:     a.  Auto-CPAP therapy with a pressure range of 5-20cm H2O.     b.  An oral appliance (OA) that can be obtained from certain dentists with expertise in sleep medicine.  These are primarily of use in non-obese patients with mild and moderate disease.     c.  An ENT consultation which may be useful to look for specific causes of obstruction and possible treatment options.     d.  If patient is intolerant to PAP therapy, consider referral to ENT for evaluation for hypoglossal nerve stimulator.   3.  Close follow-up is necessary to ensure success with CPAP or oral appliance therapy for maximum benefit.  4.  A follow-up oximetry study on CPAP is recommended to assess the adequacy of therapy and determine the need for supplemental oxygen or the potential need for Bi-level therapy.  An arterial blood gas to determine the adequacy of baseline ventilation and oxygenation should also be considered.  5.  Healthy sleep recommendations include:  adequate nightly sleep (normal 7-9 hrs/night), avoidance of caffeine after noon and alcohol near bedtime, and maintaining a sleep environment that is cool, dark and quiet.  6.  Weight loss for overweight patients is recommended.  Even modest amounts of weight loss can significantly improve the severity of sleep apnea.  7.  Snoring recommendations include:  weight loss where appropriate, side sleeping, and avoidance of alcohol before bed.  8.  Operation of motor vehicle should be avoided when sleepy.  Signature:   Fransico Him, MD; [facility];  Diplomat, Tax adviser of Sleep Medicine Electronically Signed: 12/20/2022

## 2022-12-27 ENCOUNTER — Telehealth: Payer: Self-pay | Admitting: *Deleted

## 2022-12-27 NOTE — Telephone Encounter (Signed)
-----   Message from Sueanne Margarita, MD sent at 12/20/2022 12:26 PM EST ----- Please let patient know that they have sleep apnea and recommend treating with CPAP.  Please order an auto CPAP from 4-15cm H2O with heated humidity and mask of choice.  Order overnight pulse ox on CPAP.  Followup with me in 6 weeks.

## 2022-12-27 NOTE — Telephone Encounter (Signed)
Patient notified of itamar results and recommendations. She agrees to proceed with APAP. Order has been sent to [facility] in [place].

## 2023-01-14 ENCOUNTER — Ambulatory Visit: Payer: BC Managed Care – PPO | Attending: Family

## 2023-01-14 DIAGNOSIS — I10 Essential (primary) hypertension: Secondary | ICD-10-CM

## 2023-02-14 ENCOUNTER — Other Ambulatory Visit: Payer: Self-pay | Admitting: *Deleted

## 2023-02-14 ENCOUNTER — Other Ambulatory Visit: Payer: Self-pay | Admitting: Hematology

## 2023-02-14 DIAGNOSIS — C9210 Chronic myeloid leukemia, BCR/ABL-positive, not having achieved remission: Secondary | ICD-10-CM

## 2023-02-14 NOTE — Telephone Encounter (Signed)
Sprycel refill approved.  Patient is tolerating and is to continue therapy. 

## 2023-02-19 ENCOUNTER — Encounter: Payer: Self-pay | Admitting: Family Medicine

## 2023-02-19 ENCOUNTER — Other Ambulatory Visit: Payer: Self-pay | Admitting: Family Medicine

## 2023-02-19 DIAGNOSIS — E559 Vitamin D deficiency, unspecified: Secondary | ICD-10-CM | POA: Diagnosis not present

## 2023-02-19 DIAGNOSIS — C9210 Chronic myeloid leukemia, BCR/ABL-positive, not having achieved remission: Secondary | ICD-10-CM | POA: Diagnosis not present

## 2023-02-19 DIAGNOSIS — E785 Hyperlipidemia, unspecified: Secondary | ICD-10-CM | POA: Diagnosis not present

## 2023-02-19 DIAGNOSIS — I10 Essential (primary) hypertension: Secondary | ICD-10-CM | POA: Diagnosis not present

## 2023-02-20 LAB — SPECIMEN STATUS REPORT

## 2023-03-05 NOTE — Progress Notes (Signed)
[facility] [address], [place] [address]    Clinic Day:  03/06/2023  Referring physician: Kerri Perches, MD  Patient Care Team: Kerri Perches, MD as PCP - General Marcia Massed, MD as Consulting Physician (Hematology) Lanelle Bal, DO as Consulting Physician (Internal Medicine)   ASSESSMENT & PLAN:   Assessment: 1.  CML in chronic phase: -Presentation with hyperleukocytosis.  BCR/ABL by FISH positive. -CT chest PE protocol showed splenomegaly measuring 14 cm. -BMBX on 02/23/2020 shows hypercellular marrow with myeloid hyperplasia consistent with CML.  No increase in blasts.  Flow cytometry less than 1% blasts.  Chromosome analysis shows Philadelphia chromosome. -Dasatinib 70 mg daily started on 04/01/2020. -BCR/ABL by quantitative PCR on 06/30/2020 shows B3 A2 transcript improved to 2.19 from 128 previously.  B2 A2 and E1 A2 transcripts are undetectable. -BCR/ABL by quantitative PCR on 11/21/2020 -. - BCR/ABL from 03/22/2021 was negative. - BCR/ABL on 06/27/2021 was undetectable. - BCR/ABL on 01/31/2022 ([facility])-0.037% - BCR/ABL (P210 fusion transcript) on 06/19/2022-0.004% - [facility] BCR/ABL PCR on 11/02/2022: P210 transcript at 1.532%.    Plan: 1.  CML in chronic phase: - She is tolerating Dasatinib very well. - I have reviewed labs from [facility].  CBC was normal. - BCR/ABL by quantitative PCR is undetectable. - She does not have any evidence of fluid retention. - Continue Dasatinib 70 mg daily.  RTC 3 months with repeat labs including BCR/ABL.   2.  Hypertension: - Continue spironolactone, losartan and Cardizem.  Blood pressure is 149/88.   3.  Normocytic anemia: - Hemoglobin is improved to 11.7 and normal.  Orders Placed This Encounter  Procedures   CBC with Differential    Standing Status:   Future    Standing Expiration Date:   03/05/2024   Comprehensive metabolic panel    Standing Status:   Future    Standing  Expiration Date:   03/05/2024   Magnesium    Standing Status:   Future    Standing Expiration Date:   03/05/2024   BCR-ABL1, CML/ALL, PCR, QUANT    Standing Status:   Future    Standing Expiration Date:   03/05/2024      I,Katie Daubenspeck,acting as a scribe for Marcia Massed, MD.,have documented all relevant documentation on the behalf of Marcia Massed, MD,as directed by  Marcia Massed, MD while in the presence of Marcia Massed, MD.   I, Marcia Massed MD, have reviewed the above documentation for accuracy and completeness, and I agree with the above.   Marcia Massed, MD   4/24/20245:50 PM  CHIEF COMPLAINT:   Diagnosis: CML    Cancer Staging  No matching staging information was found for the patient.   Prior Therapy: none  Current Therapy:  Dasatinib 70 mg daily    HISTORY OF PRESENT ILLNESS:   Oncology History   No history exists.     INTERVAL HISTORY:   Marcia Hahn is a 47 y.o. female presenting to clinic today for follow up of CML. She was last seen by me on 12/04/22.  Today, she states that she is doing well overall. Her appetite level is at 85%. Her energy level is at 80%.  PAST MEDICAL HISTORY:   Past Medical History: Past Medical History:  Diagnosis Date   Abnormal mammogram of right breast 09/03/2018   Acne vulgaris    CML (chronic myelocytic leukemia)    Hypertension    Mild obesity     Surgical History: Past Surgical History:  Procedure Laterality Date   BREAST BIOPSY Left    CESAREAN SECTION     COLONOSCOPY WITH PROPOFOL N/A 03/17/2021   Procedure: COLONOSCOPY WITH PROPOFOL;  Surgeon: Lanelle Bal, DO;  [facility];  Service: Endoscopy;  Laterality: N/A;  ASA II / AM procedure   DILATATION AND CURETTAGE/HYSTEROSCOPY WITH MINERVA N/A 10/26/2020   Procedure: DILATATION AND CURETTAGE/HYSTEROSCOPY WITH MINERVA ENDOMETRIAL ABLATION;  Surgeon: Lazaro Arms, MD;  Location: [facility];  Service: Gynecology;   Laterality: N/A;   LAPAROSCOPIC BILATERAL SALPINGECTOMY Bilateral 10/26/2020   Procedure: LAPAROSCOPIC BILATERAL SALPINGECTOMY;  Surgeon: Lazaro Arms, MD;  Location: [facility];  Service: Gynecology;  Laterality: Bilateral;   POLYPECTOMY  03/17/2021   Procedure: POLYPECTOMY;  Surgeon: Lanelle Bal, DO;  Location: [facility];  Service: Endoscopy;;    Social History: Social History   Socioeconomic History   Marital status: Married    Spouse name: Not on file   Number of children: 2   Years of education: Not on file   Highest education level: Bachelor's degree (e.g., BA, AB, BS)  Occupational History   Occupation: Set designer   Tobacco Use   Smoking status: Former    Types: Cigarettes    Quit date: 12/04/2012    Years since quitting: 10.2   Smokeless tobacco: Never  Vaping Use   Vaping Use: Never used  Substance and Sexual Activity   Alcohol use: Yes    Comment: occasional   Drug use: No   Sexual activity: Not Currently    Birth control/protection: Surgical    Comment: tubal and ablation  Other Topics Concern   Not on file  Social History Narrative   Mother of twins    Social Determinants of Health   Financial Resource Strain: Low Risk  (03/02/2023)   Overall Financial Resource Strain (CARDIA)    Difficulty of Paying Living Expenses: Not hard at all  Food Insecurity: No Food Insecurity (03/02/2023)   Hunger Vital Sign    Worried About Running Out of Food in the Last Year: Never true    Ran Out of Food in the Last Year: Never true  Transportation Needs: No Transportation Needs (03/02/2023)   PRAPARE - Administrator, Civil Service (Medical): No    Lack of Transportation (Non-Medical): No  Physical Activity: Unknown (03/02/2023)   Exercise Vital Sign    Days of Exercise per Week: 0 days    Minutes of Exercise per Session: Not on file  Stress: No Stress Concern Present (03/02/2023)   [employer] of Occupational Health - Occupational Stress  Questionnaire    Feeling of Stress : Not at all  Social Connections: Socially Integrated (03/02/2023)   Social Connection and Isolation Panel [NHANES]    Frequency of Communication with Friends and Family: More than three times a week    Frequency of Social Gatherings with Friends and Family: Once a week    Attends Religious Services: More than 4 times per year    Active Member of [employer] or Organizations: Yes    Attends Engineer, structural: More than 4 times per year    Marital Status: Married  Catering manager Violence: Not At Risk (08/25/2020)   Humiliation, Afraid, Rape, and Kick questionnaire    Fear of Current or Ex-Partner: No    Emotionally Abused: No    Physically Abused: No    Sexually Abused: No    Family History: Family History  Problem Relation Age of Onset  GER disease Mother    Diabetes Mother    Hypertension Mother    Heart attack Father 59   Heart disease Father    Migraines Sister    Breast cancer Maternal Grandmother    Stroke Maternal Grandmother    Breast cancer Cousin    Stroke Maternal Aunt    Colon cancer Neg Hx     Current Medications:  Current Outpatient Medications:    dasatinib (SPRYCEL) 70 MG tablet, TAKE 1 TABLET BY MOUTH ONCE DAILY AT THE SAME TIME. MAY TAKE WITH OR WITHOUT FOOD. SWALLOW WHOLE. AVOID GRAPEFRUIT PRODUCTS., Disp: 30 tablet, Rfl: 6   diltiazem (CARDIZEM CD) 120 MG 24 hr capsule, Take 1 capsule (120 mg total) by mouth daily., Disp: 90 capsule, Rfl: 1   loratadine (CLARITIN) 10 MG tablet, TAKE 1 TABLET(10 MG) BY MOUTH DAILY, Disp: 90 tablet, Rfl: 1   losartan (COZAAR) 100 MG tablet, Take 1 tablet (100 mg total) by mouth daily., Disp: 90 tablet, Rfl: 1   Multiple Vitamins-Minerals (MULTIVITAMIN WITH MINERALS) tablet, Take 1 tablet by mouth at bedtime., Disp: , Rfl:    spironolactone (ALDACTONE) 100 MG tablet, Take 1 tablet (100 mg total) by mouth daily., Disp: 90 tablet, Rfl: 1   Vitamin D, Ergocalciferol, (DRISDOL) 1.25 MG  (50000 UNIT) CAPS capsule, Take 1 capsule (50,000 Units total) by mouth every 7 (seven) days., Disp: 12 capsule, Rfl: 2   Allergies: Allergies  Allergen Reactions   Amlodipine     Headache    Tomato Itching and Swelling    Processed tomatoes in the can.     REVIEW OF SYSTEMS:   Review of Systems  Constitutional:  Negative for chills, fatigue and fever.  HENT:   Negative for lump/mass, mouth sores, nosebleeds, sore throat and trouble swallowing.   Eyes:  Negative for eye problems.  Respiratory:  Negative for cough and shortness of breath.   Cardiovascular:  Negative for chest pain, leg swelling and palpitations.  Gastrointestinal:  Negative for abdominal pain, constipation, diarrhea, nausea and vomiting.  Genitourinary:  Negative for bladder incontinence, difficulty urinating, dysuria, frequency, hematuria and nocturia.   Musculoskeletal:  Negative for arthralgias, back pain, flank pain, myalgias and neck pain.  Skin:  Negative for itching and rash.  Neurological:  Negative for dizziness, headaches and numbness.  Hematological:  Does not bruise/bleed easily.  Psychiatric/Behavioral:  Negative for depression, sleep disturbance and suicidal ideas. The patient is not nervous/anxious.   All other systems reviewed and are negative.    VITALS:   Blood pressure (!) 149/88, pulse 62, temperature 98.3 F (36.8 C), temperature source Oral, resp. rate 16, weight 220 lb 12.8 oz (100.2 kg), SpO2 100 %.  Wt Readings from Last 3 Encounters:  03/06/23 220 lb 12.8 oz (100.2 kg)  03/06/23 220 lb (99.8 kg)  12/13/22 227 lb 12.8 oz (103.3 kg)    Body mass index is 35.64 kg/m.  Performance status (ECOG): 0 - Asymptomatic   PHYSICAL EXAM:   Physical Exam Vitals and nursing note reviewed. Exam conducted with a chaperone present.  Constitutional:      Appearance: Normal appearance.  Cardiovascular:     Rate and Rhythm: Normal rate and regular rhythm.     Pulses: Normal pulses.     Heart  sounds: Normal heart sounds.  Pulmonary:     Effort: Pulmonary effort is normal.     Breath sounds: Normal breath sounds.  Abdominal:     Palpations: Abdomen is soft. There is no hepatomegaly, splenomegaly  or mass.     Tenderness: There is no abdominal tenderness.  Musculoskeletal:     Right lower leg: No edema.     Left lower leg: No edema.  Lymphadenopathy:     Cervical: No cervical adenopathy.     Right cervical: No superficial, deep or posterior cervical adenopathy.    Left cervical: No superficial, deep or posterior cervical adenopathy.     Upper Body:     Right upper body: No supraclavicular or axillary adenopathy.     Left upper body: No supraclavicular or axillary adenopathy.  Neurological:     General: No focal deficit present.     Mental Status: She is alert and oriented to person, place, and time.  Psychiatric:        Mood and Affect: Mood normal.        Behavior: Behavior normal.     LABS:      Latest Ref Rng & Units 10/09/2021    2:48 PM 06/27/2021    3:40 PM 03/22/2021   10:36 AM  CBC  WBC 4.0 - 10.5 K/uL 6.8  6.7  5.8   Hemoglobin 12.0 - 15.0 g/dL 16.1  09.6  04.5   Hematocrit 36.0 - 46.0 % 35.7  36.4  38.1   Platelets 150 - 400 K/uL 261  300  256       Latest Ref Rng & Units 02/19/2023   12:02 PM 10/09/2021    2:48 PM 06/27/2021    3:40 PM  CMP  Glucose 70 - 99 mg/dL 88  84  94   BUN 6 - 24 mg/dL 16  17  14    Creatinine 0.57 - 1.00 mg/dL 4.09  8.11  9.14   Sodium 134 - 144 mmol/L 141  139  136   Potassium 3.5 - 5.2 mmol/L 4.3  3.5  3.6   Chloride 96 - 106 mmol/L 106  106  106   CO2 20 - 29 mmol/L 21  26  27    Calcium 8.7 - 10.2 mg/dL 8.9  8.5  8.4   Total Protein 6.0 - 8.5 g/dL 6.9  6.8  6.9   Total Bilirubin 0.0 - 1.2 mg/dL 0.3  0.2  0.6   Alkaline Phos 44 - 121 IU/L 59  54  62   AST 0 - 40 IU/L 20  36  27   ALT 0 - 32 IU/L 18  41  43      No results found for: "CEA1", "CEA" / No results found for: "CEA1", "CEA" No results found for:  "PSA1" No results found for: "NWG956" No results found for: "CAN125"  No results found for: "TOTALPROTELP", "ALBUMINELP", "A1GS", "A2GS", "BETS", "BETA2SER", "GAMS", "MSPIKE", "SPEI" Lab Results  Component Value Date   TIBC 427 06/30/2020   TIBC 365 04/28/2020   FERRITIN 20 06/30/2020   FERRITIN 20 04/28/2020   FERRITIN 13 (L) 04/01/2019   IRONPCTSAT 13 06/30/2020   IRONPCTSAT 13 04/28/2020   Lab Results  Component Value Date   LDH 159 10/09/2021   LDH 154 06/27/2021   LDH 144 03/22/2021     STUDIES:   No results found.

## 2023-03-06 ENCOUNTER — Ambulatory Visit: Payer: BC Managed Care – PPO | Admitting: Family Medicine

## 2023-03-06 ENCOUNTER — Encounter: Payer: Self-pay | Admitting: Family Medicine

## 2023-03-06 ENCOUNTER — Inpatient Hospital Stay: Payer: BC Managed Care – PPO | Attending: Hematology | Admitting: Hematology

## 2023-03-06 VITALS — BP 149/88 | HR 62 | Temp 98.3°F | Resp 16 | Wt 220.8 lb

## 2023-03-06 VITALS — BP 160/90 | HR 66 | Resp 16 | Ht 66.0 in | Wt 220.0 lb

## 2023-03-06 DIAGNOSIS — E8881 Metabolic syndrome: Secondary | ICD-10-CM

## 2023-03-06 DIAGNOSIS — E559 Vitamin D deficiency, unspecified: Secondary | ICD-10-CM

## 2023-03-06 DIAGNOSIS — Z803 Family history of malignant neoplasm of breast: Secondary | ICD-10-CM | POA: Insufficient documentation

## 2023-03-06 DIAGNOSIS — Z79899 Other long term (current) drug therapy: Secondary | ICD-10-CM | POA: Diagnosis not present

## 2023-03-06 DIAGNOSIS — C9210 Chronic myeloid leukemia, BCR/ABL-positive, not having achieved remission: Secondary | ICD-10-CM

## 2023-03-06 DIAGNOSIS — E785 Hyperlipidemia, unspecified: Secondary | ICD-10-CM

## 2023-03-06 DIAGNOSIS — Z800 Family history of malignant neoplasm of digestive organs: Secondary | ICD-10-CM | POA: Diagnosis not present

## 2023-03-06 DIAGNOSIS — Z87891 Personal history of nicotine dependence: Secondary | ICD-10-CM | POA: Insufficient documentation

## 2023-03-06 DIAGNOSIS — I10 Essential (primary) hypertension: Secondary | ICD-10-CM | POA: Diagnosis not present

## 2023-03-06 DIAGNOSIS — D649 Anemia, unspecified: Secondary | ICD-10-CM | POA: Insufficient documentation

## 2023-03-06 DIAGNOSIS — R7303 Prediabetes: Secondary | ICD-10-CM

## 2023-03-06 MED ORDER — LORATADINE 10 MG PO TABS: ORAL_TABLET | ORAL | 1 refills | Status: DC

## 2023-03-06 MED ORDER — DILTIAZEM HCL ER COATED BEADS 120 MG PO CP24
120.0000 mg | ORAL_CAPSULE | Freq: Every day | ORAL | 1 refills | Status: DC
Start: 2023-03-06 — End: 2023-06-18

## 2023-03-06 MED ORDER — SPIRONOLACTONE 100 MG PO TABS: 100.0000 mg | ORAL_TABLET | Freq: Every day | ORAL | 1 refills | Status: DC

## 2023-03-06 MED ORDER — VITAMIN D (ERGOCALCIFEROL) 1.25 MG (50000 UNIT) PO CAPS
50000.0000 [IU] | ORAL_CAPSULE | ORAL | 2 refills | Status: DC
Start: 2023-03-06 — End: 2023-03-19

## 2023-03-06 MED ORDER — LOSARTAN POTASSIUM 100 MG PO TABS: 100.0000 mg | ORAL_TABLET | Freq: Every day | ORAL | 1 refills | Status: DC

## 2023-03-06 NOTE — Patient Instructions (Addendum)
Annual exam early September, call if you need me sooner  Congrats on change in eating habits, please increase vegetables and fruit Commit to 64 ounces water daily Please start 30 mins of exercise daily for at least 5 days per week  Congrats on weight loss, keep it up  Blood pressure management through the [facility], I will hold on refilling your meds since you have appointment  tomorrow  Fasting lipid, chem 7 and EGFr and HBA1C 3 to 5 days before September appt  Once weekly vit D is prescribed for next 9 months  Thanks for choosing [facility], we consider it a privelige to serve you.

## 2023-03-06 NOTE — Patient Instructions (Addendum)
[facility] at [facility] Discharge Instructions   You were seen and examined today by Dr. Ellin Saba.  He reviewed the results of your lab work which are normal/stable with the exception of your Vitamin D. It is low. Start Vitamin D as prescribed.   Continue Sprycel as prescribed.   We will see you back in 3 months. We will repeat lab work prior to your next visit.    Thank you for choosing [facility] at [facility] to provide your oncology and hematology care.  To afford each patient quality time with our provider, please arrive at least 15 minutes before your scheduled appointment time.   If you have a lab appointment with the Cancer Center please come in thru the Main Entrance and check in at the main information desk.  You need to re-schedule your appointment should you arrive 10 or more minutes late.  We strive to give you quality time with our providers, and arriving late affects you and other patients whose appointments are after yours.  Also, if you no show three or more times for appointments you may be dismissed from the clinic at the providers discretion.     Again, thank you for choosing [facility]  Our hope is that these requests will decrease the amount of time that you wait before being seen by our physicians.       _____________________________________________________________  Should you have questions after your visit to [facility], please contact our office at [phone] and follow the prompts.  Our office hours are 8:00 a.m. and 4:30 p.m. Monday - Friday.  Please note that voicemails left after 4:00 p.m. may not be returned until the following business day.  We are closed weekends and major holidays.  You do have access to a nurse 24-7, just call the main number to the clinic [phone] and do not press any options, hold on the line and a nurse will answer the phone.    For prescription refill  requests, have your pharmacy contact our office and allow 72 hours.    Due to Covid, you will need to wear a mask upon entering the hospital. If you do not have a mask, a mask will be given to you at the Main Entrance upon arrival. For doctor visits, patients may have 1 support person age 52 or older with them. For treatment visits, patients can not have anyone with them due to social distancing guidelines and our immunocompromised population.

## 2023-03-07 ENCOUNTER — Encounter: Payer: Self-pay | Admitting: Family Medicine

## 2023-03-07 ENCOUNTER — Ambulatory Visit (HOSPITAL_BASED_OUTPATIENT_CLINIC_OR_DEPARTMENT_OTHER): Payer: BC Managed Care – PPO | Admitting: Cardiovascular Disease

## 2023-03-07 ENCOUNTER — Encounter (HOSPITAL_BASED_OUTPATIENT_CLINIC_OR_DEPARTMENT_OTHER): Payer: Self-pay | Admitting: Cardiovascular Disease

## 2023-03-07 VITALS — BP 127/78 | HR 68 | Ht 66.0 in | Wt 222.2 lb

## 2023-03-07 DIAGNOSIS — R011 Cardiac murmur, unspecified: Secondary | ICD-10-CM | POA: Diagnosis not present

## 2023-03-07 DIAGNOSIS — I10 Essential (primary) hypertension: Secondary | ICD-10-CM | POA: Diagnosis not present

## 2023-03-07 DIAGNOSIS — G4733 Obstructive sleep apnea (adult) (pediatric): Secondary | ICD-10-CM | POA: Insufficient documentation

## 2023-03-07 HISTORY — DX: Cardiac murmur, unspecified: R01.1

## 2023-03-07 HISTORY — DX: Obstructive sleep apnea (adult) (pediatric): G47.33

## 2023-03-07 LAB — LIPID PANEL W/O CHOL/HDL RATIO
Cholesterol, Total: 166 mg/dL (ref 100–199)
HDL: 38 mg/dL — ABNORMAL LOW (ref >39)
LDL Chol Calc (NIH): 105 mg/dL — ABNORMAL HIGH (ref 0–99)
Triglycerides: 130 mg/dL (ref 0–149)
VLDL Cholesterol Cal: 23 mg/dL (ref 5–40)

## 2023-03-07 LAB — COMPREHENSIVE METABOLIC PANEL
ALT: 18 IU/L (ref 0–32)
AST: 20 IU/L (ref 0–40)
Albumin/Globulin Ratio: 1.6 (ref 1.2–2.2)
Albumin: 4.2 g/dL (ref 3.9–4.9)
Alkaline Phosphatase: 59 IU/L (ref 44–121)
BUN/Creatinine Ratio: 14 (ref 9–23)
BUN: 16 mg/dL (ref 6–24)
Bilirubin Total: 0.3 mg/dL (ref 0.0–1.2)
CO2: 21 mmol/L (ref 20–29)
Calcium: 8.9 mg/dL (ref 8.7–10.2)
Chloride: 106 mmol/L (ref 96–106)
Creatinine, Ser: 1.15 mg/dL — ABNORMAL HIGH (ref 0.57–1.00)
Globulin, Total: 2.7 g/dL (ref 1.5–4.5)
Glucose: 88 mg/dL (ref 70–99)
Potassium: 4.3 mmol/L (ref 3.5–5.2)
Sodium: 141 mmol/L (ref 134–144)
Total Protein: 6.9 g/dL (ref 6.0–8.5)
eGFR: 59 mL/min/{1.73_m2} — ABNORMAL LOW (ref >59)

## 2023-03-07 LAB — TSH: TSH: 1.86 u[IU]/mL (ref 0.450–4.500)

## 2023-03-07 LAB — HGB A1C W/O EAG: Hgb A1c MFr Bld: 5.7 % — ABNORMAL HIGH (ref 4.8–5.6)

## 2023-03-07 LAB — VITAMIN D 25 HYDROXY (VIT D DEFICIENCY, FRACTURES): Vit D, 25-Hydroxy: 15.2 ng/mL — ABNORMAL LOW (ref 30.0–100.0)

## 2023-03-07 NOTE — Assessment & Plan Note (Signed)
Systolic murmur noted on exam today.  She notes that this is new.  No heart failure symptoms.  We will get a baseline echo.

## 2023-03-07 NOTE — Patient Instructions (Addendum)
Medication Instructions:  START TAKING YOUR MEDICATIONS IN THE MORNING  Labwork: NONE  Testing/Procedures: Your physician has requested that you have an echocardiogram. Echocardiography is a painless test that uses sound waves to create images of your heart. It provides your doctor with information about the size and shape of your heart and how well your heart's chambers and valves are working. This procedure takes approximately one hour. There are no restrictions for this procedure. Please do NOT wear cologne, perfume, aftershave, or lotions (deodorant is allowed). Please arrive 15 minutes prior to your appointment time.   03/26/2023 3:00 pm   Follow-Up: 09/04/2023 4:00 pm with Dr Duke Salvia   Any Other Special Instructions Will Be Listed Below (If Applicable). TALK WITH YOUR DENTIST REGARDING AN ORAL AIRWAY DEVICE FOR SLEEP APNEA   If you need a refill on your cardiac medications before your next appointment, please call your pharmacy.

## 2023-03-07 NOTE — Progress Notes (Deleted)
Advanced Hypertension Clinic Follow Up:    Date:  03/07/2023   ID:  Marcia Hahn, DOB [birth year], MRN [id number]  PCP:  Kerri Perches, MD  Cardiologist:  None  Nephrologist:  Referring MD: Kerri Perches, MD   CC: Hypertension  History of Present Illness:    Marcia Hahn is a 47 y.o. female with a hx of hypertension, CML, OSA, remote tobacco abuse, and hyperlipidemia here for follow-up.  She was first seen by Gillian Shields, NP in the [facility] 12/2022 prior to that she had seen Dr. Tenny Craw most recently in 2021.  She had been on amlodipine which was discontinued due to edema.  She was first diagnosed with hypertension in her 30s or early 74s.  Blood pressures at home were in the 120s over 60s.  Blood pressure was also controlled in the office.  Therefore no changes were made.  She was referred for a sleep study which did reveal sleep apnea and a CPAP was recommended.   Previous antihypertensives: Amlodipine - headache Hydralazine  Clonidine   Past Medical History:  Diagnosis Date   Abnormal mammogram of right breast 09/03/2018   Acne vulgaris    CML (chronic myelocytic leukemia)    Hypertension    Mild obesity     Past Surgical History:  Procedure Laterality Date   BREAST BIOPSY Left    CESAREAN SECTION     COLONOSCOPY WITH PROPOFOL N/A 03/17/2021   Procedure: COLONOSCOPY WITH PROPOFOL;  Surgeon: Lanelle Bal, DO;  [facility];  Service: Endoscopy;  Laterality: N/A;  ASA II / AM procedure   DILATATION AND CURETTAGE/HYSTEROSCOPY WITH MINERVA N/A 10/26/2020   Procedure: DILATATION AND CURETTAGE/HYSTEROSCOPY WITH MINERVA ENDOMETRIAL ABLATION;  Surgeon: Lazaro Arms, MD;  Location: [facility];  Service: Gynecology;  Laterality: N/A;   LAPAROSCOPIC BILATERAL SALPINGECTOMY Bilateral 10/26/2020   Procedure: LAPAROSCOPIC BILATERAL SALPINGECTOMY;  Surgeon: Lazaro Arms, MD;  Location: [facility];  Service: Gynecology;  Laterality:  Bilateral;   POLYPECTOMY  03/17/2021   Procedure: POLYPECTOMY;  Surgeon: Lanelle Bal, DO;  [facility];  Service: Endoscopy;;    Current Medications: No outpatient medications have been marked as taking for the 03/07/23 encounter (Appointment) with Chilton Si, MD.     Allergies:   Amlodipine and Tomato   Social History   Socioeconomic History   Marital status: Married    Spouse name: Not on file   Number of children: 2   Years of education: Not on file   Highest education level: Bachelor's degree (e.g., BA, AB, BS)  Occupational History   Occupation: Set designer   Tobacco Use   Smoking status: Former    Types: Cigarettes    Quit date: 12/04/2012    Years since quitting: 10.2   Smokeless tobacco: Never  Vaping Use   Vaping Use: Never used  Substance and Sexual Activity   Alcohol use: Yes    Comment: occasional   Drug use: No   Sexual activity: Not Currently    Birth control/protection: Surgical    Comment: tubal and ablation  Other Topics Concern   Not on file  Social History Narrative   Mother of twins    Social Determinants of Health   Financial Resource Strain: Low Risk  (03/02/2023)   Overall Financial Resource Strain (CARDIA)    Difficulty of Paying Living Expenses: Not hard at all  Food Insecurity: No Food Insecurity (03/02/2023)   Hunger Vital Sign  Worried About Programme researcher, broadcasting/film/video in the Last Year: Never true    Ran Out of Food in the Last Year: Never true  Transportation Needs: No Transportation Needs (03/02/2023)   PRAPARE - Administrator, Civil Service (Medical): No    Lack of Transportation (Non-Medical): No  Physical Activity: Unknown (03/02/2023)   Exercise Vital Sign    Days of Exercise per Week: 0 days    Minutes of Exercise per Session: Not on file  Stress: No Stress Concern Present (03/02/2023)   [employer] of Occupational Health - Occupational Stress Questionnaire    Feeling of Stress : Not at all   Social Connections: Socially Integrated (03/02/2023)   Social Connection and Isolation Panel [NHANES]    Frequency of Communication with Friends and Family: More than three times a week    Frequency of Social Gatherings with Friends and Family: Once a week    Attends Religious Services: More than 4 times per year    Active Member of [employer] or Organizations: Yes    Attends Engineer, structural: More than 4 times per year    Marital Status: Married     Family History: The patient's ***family history includes Breast cancer in her cousin and maternal grandmother; Diabetes in her mother; GER disease in her mother; Heart attack (age of onset: 59) in her father; Heart disease in her father; Hypertension in her mother; Migraines in her sister; Stroke in her maternal aunt and maternal grandmother. There is no history of Colon cancer.  ROS:   Please see the history of present illness.    *** All other systems reviewed and are negative.  EKGs/Labs/Other Studies Reviewed:    EKG:  EKG is *** ordered today.  The ekg ordered today demonstrates ***  Recent Labs: 02/19/2023: ALT 18; BUN 16; Creatinine, Ser 1.15; Potassium 4.3; Sodium 141; TSH 1.860   Recent Lipid Panel    Component Value Date/Time   CHOL 166 02/19/2023 1202   TRIG 130 02/19/2023 1202   HDL 38 (L) 02/19/2023 1202   CHOLHDL 4.0 11/21/2021 1120   CHOLHDL 3.8 06/30/2020 0841   VLDL 18 06/30/2020 0841   LDLCALC 105 (H) 02/19/2023 1202   LDLCALC 85 10/07/2019 1021    Physical Exam:   VS:  There were no vitals taken for this visit. , BMI There is no height or weight on file to calculate BMI. GENERAL:  Well appearing HEENT: Pupils equal round and reactive, fundi not visualized, oral mucosa unremarkable NECK:  No jugular venous distention, waveform within normal limits, carotid upstroke brisk and symmetric, no bruits, no thyromegaly LYMPHATICS:  No cervical adenopathy LUNGS:  Clear to auscultation bilaterally HEART:  RRR.   PMI not displaced or sustained,S1 and S2 within normal limits, no S3, no S4, no clicks, no rubs, *** murmurs ABD:  Flat, positive bowel sounds normal in frequency in pitch, no bruits, no rebound, no guarding, no midline pulsatile mass, no hepatomegaly, no splenomegaly EXT:  2 plus pulses throughout, no edema, no cyanosis no clubbing SKIN:  No rashes no nodules NEURO:  Cranial nerves II through XII grossly intact, motor grossly intact throughout PSYCH:  Cognitively intact, oriented to person place and time   ASSESSMENT/PLAN:    No problem-specific Assessment & Plan notes found for this encounter.   Screening for Secondary Hypertension: { Click here to document screening for secondary causes of HTN  :[id number]}    12/14/2022    6:14 PM  Causes  Renovascular  HTN Screened  Sleep Apnea Screened  Thyroid Disease Screened  Hyperaldosteronism Not Screened  Pheochromocytoma Not Screened  Cushing's Syndrome Not Screened  Hyperparathyroidism Not Screened  Coarctation of the Aorta Screened     - Comments BP symmetrical    Relevant Labs/Studies:    Latest Ref Rng & Units 02/19/2023   12:02 PM 10/09/2021    2:48 PM 06/27/2021    3:40 PM  Basic Labs  Sodium 134 - 144 mmol/L 141  139  136   Potassium 3.5 - 5.2 mmol/L 4.3  3.5  3.6   Creatinine 0.57 - 1.00 mg/dL 4.54  0.98  1.19        Latest Ref Rng & Units 02/19/2023   12:02 PM 11/21/2021   11:20 AM  Thyroid   TSH 0.450 - 4.500 uIU/mL 1.860  2.160                 01/14/2023   10:22 AM  Renovascular   Renal Artery [place] Completed Yes        she consents to be monitored in our remote patient monitoring program through [employer].  she will track his blood pressure twice daily and understands that these trends will help [place] to adjust her medications as needed prior to his next appointment.  she *** interested in enrolling in the PREP exercise and nutrition program through the [facility].     Disposition:    FU with MD/PharmD in {gen number  [id number]} {Days to years:10300}    Medication Adjustments/Labs and Tests Ordered: Current medicines are reviewed at length with the patient today.  Concerns regarding medicines are outlined above.  No orders of the defined types were placed in this encounter.  No orders of the defined types were placed in this encounter.    Signed, Chilton Si, MD  03/07/2023 8:22 AM    [facility]

## 2023-03-07 NOTE — Assessment & Plan Note (Signed)
Uncontrolled on current meds, being followed by [facility] DASH diet and commitment to daily physical activity for a minimum of 30 minutes discussed and encouraged, as a part of hypertension management. The importance of attaining a healthy weight is also discussed.     03/06/2023   11:23 AM 03/06/2023   10:46 AM 03/06/2023   10:45 AM 03/06/2023   10:02 AM 12/13/2022    8:45 AM 12/13/2022    8:13 AM 12/04/2022    2:14 PM  BP/Weight  Systolic BP 149 160 160 133 126 130 128  Diastolic BP 88 90 92 83 70 68 79  Wt. (Lbs) 220.8   220  227.8 231.6  BMI 35.64 kg/m2   35.51 kg/m2  39.1 kg/m2 39.75 kg/m2

## 2023-03-07 NOTE — Progress Notes (Addendum)
Advanced Hypertension Clinic Follow Up:    Date:  03/07/2023   ID:  Marcia Hahn, DOB [birth year], MRN [id number]  PCP:  Kerri Perches, MD  Cardiologist:  None  Nephrologist:  Referring MD: Kerri Perches, MD   CC: Hypertension  History of Present Illness:    Marcia Hahn is a 47 y.o. female with a hx of hypertension, CML, OSA, remote tobacco abuse, and hyperlipidemia here for follow-up.  She was first seen by Gillian Shields, NP in the Advanced Hypertension Clinic 12/2022 prior to that she had seen Dr. Tenny Craw most recently in 2021.  She had been on amlodipine which was discontinued due to edema.  She was first diagnosed with hypertension in her 30s or early 42s.  Blood pressures at home were in the 120s over 60s.  Blood pressure was also controlled in the office.  Therefore no changes were made.  She was referred for a sleep study which did reveal sleep apnea and a CPAP was recommended.  Today, she reports that her blood pressure was significantly elevated yesterday due to missing a dose or two of her medications. Normally she takes her antihypertensives at night time, so she sometimes falls asleep quickly without taking her meds. At one time she felt she had stomach cramps on HCTZ, so she had switched her timing to the evenings. In clinic her blood pressure is at goal 127/78. Lately her breathing has been stable. She has had occasional LE swelling but overall much improved recently. She admits to struggling with exercising consistently. She was walking in the park, but fell out of the habit. She would like to start walking again now that the weather is improving. Her diet consists of a mixture of cooking at home and ordering out. She is careful to choose healthier options when ordering. Frequently she doesn't eat breakfast. She feels she is not ready to start using a CPAP machine. She has been trying to lose weight. She denies daytime somnolence or waking up feeling sleepy. She  denies any palpitations, chest pain, lightheadedness, headaches, syncope, orthopnea, or PND.  Previous antihypertensives: Amlodipine - headache Hydralazine  Clonidine   Past Medical History:  Diagnosis Date   Abnormal mammogram of right breast 09/03/2018   Acne vulgaris    CML (chronic myelocytic leukemia)    Essential hypertension 02/27/2008   Qualifier: Diagnosis of   By: Doreene Nest LPN, Jaime       Hypertension    Mild obesity    Murmur 03/07/2023   OSA (obstructive sleep apnea) 03/07/2023    Past Surgical History:  Procedure Laterality Date   BREAST BIOPSY Left    CESAREAN SECTION     COLONOSCOPY WITH PROPOFOL N/A 03/17/2021   Procedure: COLONOSCOPY WITH PROPOFOL;  Surgeon: Lanelle Bal, DO;  [facility];  Service: Endoscopy;  Laterality: N/A;  ASA II / AM procedure   DILATATION AND CURETTAGE/HYSTEROSCOPY WITH MINERVA N/A 10/26/2020   Procedure: DILATATION AND CURETTAGE/HYSTEROSCOPY WITH MINERVA ENDOMETRIAL ABLATION;  Surgeon: Lazaro Arms, MD;  Location: [facility];  Service: Gynecology;  Laterality: N/A;   LAPAROSCOPIC BILATERAL SALPINGECTOMY Bilateral 10/26/2020   Procedure: LAPAROSCOPIC BILATERAL SALPINGECTOMY;  Surgeon: Lazaro Arms, MD;  Location: [facility];  Service: Gynecology;  Laterality: Bilateral;   POLYPECTOMY  03/17/2021   Procedure: POLYPECTOMY;  Surgeon: Lanelle Bal, DO;  [facility];  Service: Endoscopy;;    Current Medications: Current Meds  Medication Sig   dasatinib (SPRYCEL) 70 MG tablet TAKE  1 TABLET BY MOUTH ONCE DAILY AT THE SAME TIME. MAY TAKE WITH OR WITHOUT FOOD. SWALLOW WHOLE. AVOID GRAPEFRUIT PRODUCTS.   diltiazem (CARDIZEM CD) 120 MG 24 hr capsule Take 1 capsule (120 mg total) by mouth daily.   loratadine (CLARITIN) 10 MG tablet TAKE 1 TABLET(10 MG) BY MOUTH DAILY   losartan (COZAAR) 100 MG tablet Take 1 tablet (100 mg total) by mouth daily.   Multiple Vitamins-Minerals (MULTIVITAMIN WITH MINERALS) tablet Take 1  tablet by mouth at bedtime.   spironolactone (ALDACTONE) 100 MG tablet Take 1 tablet (100 mg total) by mouth daily.   Vitamin D, Ergocalciferol, (DRISDOL) 1.25 MG (50000 UNIT) CAPS capsule Take 1 capsule (50,000 Units total) by mouth every 7 (seven) days.     Allergies:   Amlodipine and Tomato   Social History   Socioeconomic History   Marital status: Married    Spouse name: Not on file   Number of children: 2   Years of education: Not on file   Highest education level: Bachelor's degree (e.g., BA, AB, BS)  Occupational History   Occupation: Set designer   Tobacco Use   Smoking status: Former    Types: Cigarettes    Quit date: 12/04/2012    Years since quitting: 10.2   Smokeless tobacco: Never  Vaping Use   Vaping Use: Never used  Substance and Sexual Activity   Alcohol use: Yes    Comment: occasional   Drug use: No   Sexual activity: Not Currently    Birth control/protection: Surgical    Comment: tubal and ablation  Other Topics Concern   Not on file  Social History Narrative   Mother of twins    Social Determinants of Health   Financial Resource Strain: Low Risk  (03/02/2023)   Overall Financial Resource Strain (CARDIA)    Difficulty of Paying Living Expenses: Not hard at all  Food Insecurity: No Food Insecurity (03/02/2023)   Hunger Vital Sign    Worried About Running Out of Food in the Last Year: Never true    Ran Out of Food in the Last Year: Never true  Transportation Needs: No Transportation Needs (03/02/2023)   PRAPARE - Administrator, Civil Service (Medical): No    Lack of Transportation (Non-Medical): No  Physical Activity: Unknown (03/02/2023)   Exercise Vital Sign    Days of Exercise per Week: 0 days    Minutes of Exercise per Session: Not on file  Stress: No Stress Concern Present (03/02/2023)   [employer] of Occupational Health - Occupational Stress Questionnaire    Feeling of Stress : Not at all  Social Connections: Socially  Integrated (03/02/2023)   Social Connection and Isolation Panel [NHANES]    Frequency of Communication with Friends and Family: More than three times a week    Frequency of Social Gatherings with Friends and Family: Once a week    Attends Religious Services: More than 4 times per year    Active Member of [employer] or Organizations: Yes    Attends Engineer, structural: More than 4 times per year    Marital Status: Married     Family History: The patient's family history includes Breast cancer in her cousin and maternal grandmother; Diabetes in her mother; GER disease in her mother; Heart attack (age of onset: 71) in her father; Heart disease in her father; Hypertension in her mother; Migraines in her sister; Stroke in her maternal aunt and maternal grandmother. There is no history of Colon  cancer.  ROS:   Please see the history of present illness.    All other systems reviewed and are negative.  EKGs/Labs/Other Studies Reviewed:    Bilateral Renal Artery Doppler  01/14/2023: Summary:  Renal:    Right: Normal size right kidney. Normal right Resisitive Index.         Normal cortical thickness of right kidney. No evidence of         right renal artery stenosis. RRV flow present.  Left:  Normal size of left kidney. Normal left Resistive Index.         Normal cortical thickness of the left kidney. No evidence of         left renal artery stenosis. LRV flow present. Cyst(s) noted.         Mid pole cortical cyst measuring 1.8 cm x 1.9 cm x 1.7 cm.  Mesenteric:  Normal Celiac artery and Superior Mesenteric artery findings.  Patent IVC.   Bilateral Carotid Doppler  04/22/2012: IMPRESSION:  Normal carotid Doppler ultrasound.   EKG:  EKG is personally reviewed. 03/07/2023:  EKG was not ordered.  Recent Labs: 02/19/2023: ALT 18; BUN 16; Creatinine, Ser 1.15; Potassium 4.3; Sodium 141; TSH 1.860   Recent Lipid Panel    Component Value Date/Time   CHOL 166 02/19/2023 1202   TRIG 130  02/19/2023 1202   HDL 38 (L) 02/19/2023 1202   CHOLHDL 4.0 11/21/2021 1120   CHOLHDL 3.8 06/30/2020 0841   VLDL 18 06/30/2020 0841   LDLCALC 105 (H) 02/19/2023 1202   LDLCALC 85 10/07/2019 1021    Physical Exam:    VS:  BP 127/78 (BP Location: Left Arm, Patient Position: Sitting, Cuff Size: Large)   Pulse 68   Ht  (1.676 m)   Wt 222 lb 3.2 oz (100.8 kg)   SpO2 98%   BMI 35.86 kg/m  , BMI Body mass index is 35.86 kg/m. GENERAL:  Well appearing HEENT: Pupils equal round and reactive, fundi not visualized, oral mucosa unremarkable NECK:  No jugular venous distention, waveform within normal limits, carotid upstroke brisk and symmetric, no bruits, no thyromegaly LYMPHATICS:  No cervical adenopathy HEART:  RRR.  PMI not displaced or sustained,S1 and S2 within normal limits, no S3, no S4, no clicks, no rubs, 2/6 systolic murmur at the LUSB ABD:  Flat, positive bowel sounds normal in frequency in pitch, no bruits, no rebound, no guarding, no midline pulsatile mass, no hepatomegaly, no splenomegaly EXT:  2 plus pulses throughout, no edema, no cyanosis no clubbing SKIN:  No rashes no nodules NEURO:  Cranial nerves II through XII grossly intact, motor grossly intact throughout PSYCH:  Cognitively intact, oriented to person place and time   ASSESSMENT/PLAN:    Essential hypertension Blood pressure is well-controlled. She will keep working on diet and exercise.  Continue diltiazem, losartan and spironolactone.  She takes all her medication at night and forgets to take it once or twice per week.  She will start taking it in the morning.  Recommended that she put it by her toothbrush so that will be visible and she does not forget it.  She was diagnosed with sleep apnea but does not think she can tolerate a CPAP.  She will talk to her dentist about getting an oral airway device.  OSA (obstructive sleep apnea) OSA noted on her recent sleep study.  She will talk to her dentist about getting  an oral airway device as above.  Consider the inspire device if  she cannot tolerate this or does not helpful.  She does not want to use a CPAP.  Keep working on weight loss as above.  Murmur Systolic murmur noted on exam today.  She notes that this is new.  No heart failure symptoms.  We will get a baseline echo.    Screening for Secondary Hypertension:     12/14/2022    6:14 PM  Causes  Renovascular HTN Screened  Sleep Apnea Screened  Thyroid Disease Screened  Hyperaldosteronism Not Screened  Pheochromocytoma Not Screened  Cushing's Syndrome Not Screened  Hyperparathyroidism Not Screened  Coarctation of the Aorta Screened     - Comments BP symmetrical    Relevant Labs/Studies:    Latest Ref Rng & Units 02/19/2023   12:02 PM 10/09/2021    2:48 PM 06/27/2021    3:40 PM  Basic Labs  Sodium 134 - 144 mmol/L 141  139  136   Potassium 3.5 - 5.2 mmol/L 4.3  3.5  3.6   Creatinine 0.57 - 1.00 mg/dL 1.61  0.96  0.45        Latest Ref Rng & Units 02/19/2023   12:02 PM 11/21/2021   11:20 AM  Thyroid   TSH 0.450 - 4.500 uIU/mL 1.860  2.160                 01/14/2023   10:22 AM  Renovascular   Renal Artery [place] Completed Yes     Disposition:    FU with Mory Herrman C. Duke Salvia, MD, [facility] in 6 months.  Medication Adjustments/Labs and Tests Ordered: Current medicines are reviewed at length with the patient today.  Concerns regarding medicines are outlined above.   Orders Placed This Encounter  Procedures   ECHOCARDIOGRAM COMPLETE   No orders of the defined types were placed in this encounter.  I,Mathew Stumpf,acting as a Neurosurgeon for Chilton Si, MD.,have documented all relevant documentation on the behalf of Chilton Si, MD,as directed by  Chilton Si, MD while in the presence of Chilton Si, MD.  I, Travarus Trudo C. Duke Salvia, MD have reviewed all documentation for this visit.  The documentation of the exam, diagnosis, procedures, and orders on 03/07/2023 are all accurate  and complete.   Signed, Chilton Si, MD  03/07/2023 4:26 PM    [facility]

## 2023-03-07 NOTE — Progress Notes (Signed)
Marcia Hahn     MRN: [id number]      DOB: 18-May-1976   HPI Marcia Hahn is here for follow up and re-evaluation of chronic medical conditions, medication management and review of any available recent lab and radiology data.  Preventive health is updated, specifically  Cancer screening and Immunization.   . The PT denies any adverse reactions to current medications since the last visit.  There are no new concerns.  There are no specific complaints   ROS Denies recent fever or chills. Denies sinus pressure, nasal congestion, ear pain or sore throat. Denies chest congestion, productive cough or wheezing. Denies chest pains, palpitations and leg swelling Denies abdominal pain, nausea, vomiting,diarrhea or constipation.   Denies dysuria, frequency, hesitancy or incontinence. Denies joint pain, swelling and limitation in mobility. Denies headaches, seizures, numbness, or tingling. Denies depression, anxiety or insomnia. Denies skin break down or rash.   PE  BP (!) 160/90   Pulse 66   Resp 16   Ht  (1.676 m)   Wt 220 lb (99.8 kg)   SpO2 96%   BMI 35.51 kg/m   Patient alert and oriented and in no cardiopulmonary distress.  HEENT: No facial asymmetry, EOMI,     Neck supple .  Chest: Clear to auscultation bilaterally.  CVS: S1, S2 no murmurs, no S3.Regular rate.  ABD: Soft non tender.   Ext: No edema  MS: Adequate ROM spine, shoulders, hips and knees.  Skin: Intact, no ulcerations or rash noted.  Psych: Good eye contact, normal affect. Memory intact not anxious or depressed appearing.  CNS: CN 2-12 intact, power,  normal throughout.no focal deficits noted.   Assessment & Plan  Malignant hypertension Uncontrolled on current meds, being followed by [facility] DASH diet and commitment to daily physical activity for a minimum of 30 minutes discussed and encouraged, as a part of hypertension management. The importance of attaining a healthy weight is also  discussed.     03/06/2023   11:23 AM 03/06/2023   10:46 AM 03/06/2023   10:45 AM 03/06/2023   10:02 AM 12/13/2022    8:45 AM 12/13/2022    8:13 AM 12/04/2022    2:14 PM  BP/Weight  Systolic BP 149 160 160 133 126 130 128  Diastolic BP 88 90 92 83 70 68 79  Wt. (Lbs) 220.8   220  227.8 231.6  BMI 35.64 kg/m2   35.51 kg/m2  39.1 kg/m2 39.75 kg/m2       Vitamin D deficiency Updated lab needed at/ before next visit.   Morbid obesity (HCC)  Patient re-educated about  the importance of commitment to a  minimum of 150 minutes of exercise per week as able.  The importance of healthy food choices with portion control discussed, as well as eating regularly and within a 12 hour window most days. The need to choose "clean , green" food 50 to 75% of the time is discussed, as well as to make water the primary drink and set a goal of 64 ounces water daily.       03/06/2023   11:23 AM 03/06/2023   10:02 AM 12/13/2022    8:13 AM  Weight /BMI  Weight 220 lb 12.8 oz 220 lb 227 lb 12.8 oz  Height   (1.676 m)  (1.626 m)  BMI 35.64 kg/m2 35.51 kg/m2 39.1 kg/m2    Improved with lifestyle change which is great  Dyslipidemia (high LDL; low HDL) Hyperlipidemia:Low fat diet discussed  and encouraged. Needs to improve exercise commitment and lower intake of fried and fatty foods  Lipid Panel  Lab Results  Component Value Date   CHOL 166 02/19/2023   HDL 38 (L) 02/19/2023   LDLCALC 105 (H) 02/19/2023   TRIG 130 02/19/2023   CHOLHDL 4.0 11/21/2021     Updated lab needed at/ before next visit.   Prediabetes Patient educated about the importance of limiting  Carbohydrate intake , the need to commit to daily physical activity for a minimum of 30 minutes , and to commit weight loss. The fact that changes in all these areas will reduce or eliminate all together the development of diabetes is stressed.      Latest Ref Rng & Units 02/19/2023   12:02 PM 11/21/2021   11:20 AM 10/09/2021     2:48 PM 06/27/2021    3:40 PM 03/22/2021   10:36 AM  Diabetic Labs  HbA1c 4.8 - 5.6 % 5.7       Chol 100 - 199 mg/dL 161  096      HDL >04 mg/dL 38  40      Calc LDL 0 - 99 mg/dL 540  96      Triglycerides 0 - 149 mg/dL 981  191      Creatinine 0.57 - 1.00 mg/dL 4.78   2.95  6.21  3.08       03/06/2023   11:23 AM 03/06/2023   10:46 AM 03/06/2023   10:45 AM 03/06/2023   10:02 AM 12/13/2022    8:45 AM 12/13/2022    8:13 AM 12/04/2022    2:14 PM  BP/Weight  Systolic BP 149 160 160 133 126 130 128  Diastolic BP 88 90 92 83 70 68 79  Wt. (Lbs) 220.8   220  227.8 231.6  BMI 35.64 kg/m2   35.51 kg/m2  39.1 kg/m2 39.75 kg/m2      Latest Ref Rng & Units 12/23/2019   12:00 AM  Foot/eye exam completion dates  Eye Exam No Retinopathy No Retinopathy         This result is from an external source.    Updated lab needed at/ before next visit.   CML (chronic myelocytic leukemia) (HCC) Treatment through Oncology, doing well  Metabolic syndrome X The increased risk of cardiovascular disease associated with this diagnosis, and the need to consistently work on lifestyle to change this is discussed. Following  a  heart healthy diet ,commitment to 30 minutes of exercise at least 5 days per week, as well as control of blood sugar and cholesterol , and achieving a healthy weight are all the areas to be addressed .

## 2023-03-07 NOTE — Assessment & Plan Note (Signed)
Patient educated about the importance of limiting  Carbohydrate intake , the need to commit to daily physical activity for a minimum of 30 minutes , and to commit weight loss. The fact that changes in all these areas will reduce or eliminate all together the development of diabetes is stressed.      Latest Ref Rng & Units 02/19/2023   12:02 PM 11/21/2021   11:20 AM 10/09/2021    2:48 PM 06/27/2021    3:40 PM 03/22/2021   10:36 AM  Diabetic Labs  HbA1c 4.8 - 5.6 % 5.7       Chol 100 - 199 mg/dL 161  096      HDL >04 mg/dL 38  40      Calc LDL 0 - 99 mg/dL 540  96      Triglycerides 0 - 149 mg/dL 981  191      Creatinine 0.57 - 1.00 mg/dL 4.78   2.95  6.21  3.08       03/06/2023   11:23 AM 03/06/2023   10:46 AM 03/06/2023   10:45 AM 03/06/2023   10:02 AM 12/13/2022    8:45 AM 12/13/2022    8:13 AM 12/04/2022    2:14 PM  BP/Weight  Systolic BP 149 160 160 133 126 130 128  Diastolic BP 88 90 92 83 70 68 79  Wt. (Lbs) 220.8   220  227.8 231.6  BMI 35.64 kg/m2   35.51 kg/m2  39.1 kg/m2 39.75 kg/m2      Latest Ref Rng & Units 12/23/2019   12:00 AM  Foot/eye exam completion dates  Eye Exam No Retinopathy No Retinopathy         This result is from an external source.    Updated lab needed at/ before next visit.

## 2023-03-07 NOTE — Assessment & Plan Note (Addendum)
Hyperlipidemia:Low fat diet discussed and encouraged. Needs to improve exercise commitment and lower intake of fried and fatty foods  Lipid Panel  Lab Results  Component Value Date   CHOL 166 02/19/2023   HDL 38 (L) 02/19/2023   LDLCALC 105 (H) 02/19/2023   TRIG 130 02/19/2023   CHOLHDL 4.0 11/21/2021     Updated lab needed at/ before next visit.

## 2023-03-07 NOTE — Assessment & Plan Note (Signed)
The increased risk of cardiovascular disease associated with this diagnosis, and the need to consistently work on lifestyle to change this is discussed. Following  a  heart healthy diet ,commitment to 30 minutes of exercise at least 5 days per week, as well as control of blood sugar and cholesterol , and achieving a healthy weight are all the areas to be addressed .  

## 2023-03-07 NOTE — Assessment & Plan Note (Signed)
Updated lab needed at/ before next visit.   

## 2023-03-07 NOTE — Assessment & Plan Note (Signed)
  Patient re-educated about  the importance of commitment to a  minimum of 150 minutes of exercise per week as able.  The importance of healthy food choices with portion control discussed, as well as eating regularly and within a 12 hour window most days. The need to choose "clean , green" food 50 to 75% of the time is discussed, as well as to make water the primary drink and set a goal of 64 ounces water daily.       03/06/2023   11:23 AM 03/06/2023   10:02 AM 12/13/2022    8:13 AM  Weight /BMI  Weight 220 lb 12.8 oz 220 lb 227 lb 12.8 oz  Height   (1.676 m)  (1.626 m)  BMI 35.64 kg/m2 35.51 kg/m2 39.1 kg/m2    Improved with lifestyle change which is great

## 2023-03-07 NOTE — Assessment & Plan Note (Signed)
Blood pressure is well-controlled. She will keep working on diet and exercise.  Continue diltiazem, losartan and spironolactone.  She takes all her medication at night and forgets to take it once or twice per week.  She will start taking it in the morning.  Recommended that she put it by her toothbrush so that will be visible and she does not forget it.  She was diagnosed with sleep apnea but does not think she can tolerate a CPAP.  She will talk to her dentist about getting an oral airway device.

## 2023-03-07 NOTE — Assessment & Plan Note (Signed)
OSA noted on her recent sleep study.  She will talk to her dentist about getting an oral airway device as above.  Consider the inspire device if she cannot tolerate this or does not helpful.  She does not want to use a CPAP.  Keep working on weight loss as above.

## 2023-03-07 NOTE — Assessment & Plan Note (Signed)
Treatment through Oncology, doing well

## 2023-03-14 ENCOUNTER — Telehealth (HOSPITAL_BASED_OUTPATIENT_CLINIC_OR_DEPARTMENT_OTHER): Payer: Self-pay | Admitting: *Deleted

## 2023-03-14 NOTE — Telephone Encounter (Signed)
Left message for patient to call and reschedule the 03/26/23 3:00 pm Echocardiogram appointment---provider not in the office

## 2023-03-15 NOTE — Telephone Encounter (Signed)
Left message for patient to call and reschedule the 03/26/23 Echocardiogram appointment---the tech is not in the office

## 2023-03-18 NOTE — Telephone Encounter (Signed)
Spoke with patient regarding new appointment date and time for the Echocardiogram  Monday 05/06/23 at 10:00 am here at [facility].  Will mail information to patient and she voiced her understanding.

## 2023-03-19 ENCOUNTER — Other Ambulatory Visit: Payer: Self-pay

## 2023-03-19 ENCOUNTER — Encounter: Payer: Self-pay | Admitting: Family Medicine

## 2023-03-19 DIAGNOSIS — E559 Vitamin D deficiency, unspecified: Secondary | ICD-10-CM

## 2023-03-19 MED ORDER — VITAMIN D (ERGOCALCIFEROL) 1.25 MG (50000 UNIT) PO CAPS
50000.0000 [IU] | ORAL_CAPSULE | ORAL | 2 refills | Status: DC
Start: 2023-03-19 — End: 2023-11-07

## 2023-03-26 ENCOUNTER — Other Ambulatory Visit (HOSPITAL_BASED_OUTPATIENT_CLINIC_OR_DEPARTMENT_OTHER): Payer: BC Managed Care – PPO

## 2023-05-06 ENCOUNTER — Ambulatory Visit: Payer: BC Managed Care – PPO | Admitting: Hematology

## 2023-05-06 ENCOUNTER — Ambulatory Visit (INDEPENDENT_AMBULATORY_CARE_PROVIDER_SITE_OTHER): Payer: BC Managed Care – PPO

## 2023-05-06 DIAGNOSIS — R011 Cardiac murmur, unspecified: Secondary | ICD-10-CM

## 2023-05-06 LAB — ECHOCARDIOGRAM COMPLETE
AR max vel: 1.46 cm2
AV Area VTI: 1.57 cm2
AV Area mean vel: 1.52 cm2
AV Mean grad: 6 mmHg
AV Peak grad: 11.7 mmHg
Ao pk vel: 1.71 m/s
Area-P 1/2: 4.06 cm2
S' Lateral: 2.8 cm

## 2023-05-23 DIAGNOSIS — C9210 Chronic myeloid leukemia, BCR/ABL-positive, not having achieved remission: Secondary | ICD-10-CM | POA: Diagnosis not present

## 2023-05-27 ENCOUNTER — Encounter (HOSPITAL_BASED_OUTPATIENT_CLINIC_OR_DEPARTMENT_OTHER): Payer: Self-pay

## 2023-05-27 ENCOUNTER — Telehealth (HOSPITAL_BASED_OUTPATIENT_CLINIC_OR_DEPARTMENT_OTHER): Payer: Self-pay | Admitting: Family

## 2023-05-27 DIAGNOSIS — K7689 Other specified diseases of liver: Secondary | ICD-10-CM

## 2023-05-27 NOTE — Telephone Encounter (Signed)
Spoke with patient regarding upcoming Abdominal U/S scheduled at [facility] time is 7:15 am on Monday 06/03/23  at the ist floor lobby/ registration desk for a 7:30 am appointment.  Nothing to eat or drink after midnight the night before,  Patient voiced her understanding.

## 2023-06-03 ENCOUNTER — Ambulatory Visit (HOSPITAL_COMMUNITY)
Admission: RE | Admit: 2023-06-03 | Discharge: 2023-06-03 | Disposition: A | Discharge status: Home / Self Care | Payer: BC Managed Care – PPO | Source: Ambulatory Visit | Attending: Family | Admitting: Family

## 2023-06-03 DIAGNOSIS — K7689 Other specified diseases of liver: Secondary | ICD-10-CM | POA: Insufficient documentation

## 2023-06-03 DIAGNOSIS — K8020 Calculus of gallbladder without cholecystitis without obstruction: Secondary | ICD-10-CM | POA: Diagnosis not present

## 2023-06-04 NOTE — Progress Notes (Signed)
[facility] [address] [place] [address]    Clinic Day:  06/05/23   Referring physician: Kerri Perches, MD  Patient Care Team: Kerri Perches, MD as PCP - General Doreatha Massed, MD as Consulting Physician (Hematology) Lanelle Bal, DO as Consulting Physician (Internal Medicine)   ASSESSMENT & PLAN:   Assessment: 1.  CML in chronic phase: -Presentation with hyperleukocytosis.  BCR/ABL by FISH positive. -CT chest PE protocol showed splenomegaly measuring 14 cm. -BMBX on 02/23/2020 shows hypercellular marrow with myeloid hyperplasia consistent with CML.  No increase in blasts.  Flow cytometry less than 1% blasts.  Chromosome analysis shows Philadelphia chromosome. -Dasatinib 70 mg daily started on 04/01/2020. -BCR/ABL by quantitative PCR on 06/30/2020 shows B3 A2 transcript improved to 2.19 from 128 previously.  B2 A2 and E1 A2 transcripts are undetectable. -BCR/ABL by quantitative PCR on 11/21/2020 -. - BCR/ABL from 03/22/2021 was negative. - BCR/ABL on 06/27/2021 was undetectable. - BCR/ABL on 01/31/2022 ([facility])-0.037% - BCR/ABL (P210 fusion transcript) on 06/19/2022-0.004% - [facility] BCR/ABL PCR on 11/02/2022: P210 transcript at 1.532%.    Plan: 1.  CML in chronic phase: - She is tolerating Dasatinib very well. - Reviewed labs from [facility].  White count is 8.8 with differential showing slight increase in absolute lymphocyte count of 3.9. - Creatinine is minimally elevated at 1.05, likely from spironolactone. - BCR/ABL by PCR shows undetectable transcripts. - She continues to be in major molecular response.  Continue Dasatinib 70 mg daily.  No toxicities seen.  RTC 4 months with repeat labs.   2.  Hypertension: - Continue spironolactone, losartan and Cardizem.  Blood pressure is 132/67.   3.  Normocytic anemia: - Hemoglobin is 11.2 and stable.  Orders Placed This Encounter  Procedures   CBC with Differential    Standing  Status:   Future    Standing Expiration Date:   06/04/2024   Comprehensive metabolic panel    Standing Status:   Future    Standing Expiration Date:   06/04/2024   Lactate dehydrogenase    Standing Status:   Future    Standing Expiration Date:   06/04/2024   BCR-ABL1, CML/ALL, PCR, QUANT    Standing Status:   Future    Standing Expiration Date:   06/04/2024       Alben Deeds Teague,acting as a scribe for Doreatha Massed, MD.,have documented all relevant documentation on the behalf of Doreatha Massed, MD,as directed by  Doreatha Massed, MD while in the presence of Doreatha Massed, MD.  I, Doreatha Massed MD, have reviewed the above documentation for accuracy and completeness, and I agree with the above.    Doreatha Massed, MD   7/24/20247:07 PM  CHIEF COMPLAINT:   Diagnosis: CML    Cancer Staging  No matching staging information was found for the patient.    Prior Therapy: none  Current Therapy:  Dasatinib 70 mg daily    HISTORY OF PRESENT ILLNESS:   Oncology History   No history exists.     INTERVAL HISTORY:   Marcia Hahn is a 47 y.o. female presenting to clinic today for follow up of CML. She was last seen by me on 03/06/23.  She had a CT of the abdomen limited to the RUQ on 7/22 that found: a large stone in the gallbladder lumen measuring 18 mm; multiple cysts in the liver measuring up to 11 cm in the right hepatic lobe and 3.2 cm in the left hepatic lobe; and  mild increased parenchymal echogenicity.   Today, she states that she is doing well overall. Her appetite level is at 100%. Her energy level is at 100%.  She denies any infections, nausea, vomiting, or recent medical issues. She denies any issues with Sprycel or any changes in medications. She reports her father died at 39 of an MI.   PAST MEDICAL HISTORY:   Past Medical History: Past Medical History:  Diagnosis Date   Abnormal mammogram of right breast 09/03/2018   Acne vulgaris     CML (chronic myelocytic leukemia) (HCC)    Essential hypertension 02/27/2008   Qualifier: Diagnosis of   By: Doreene Nest LPN, Jaime       Hypertension    Mild obesity    Murmur 03/07/2023   OSA (obstructive sleep apnea) 03/07/2023    Surgical History: Past Surgical History:  Procedure Laterality Date   BREAST BIOPSY Left    CESAREAN SECTION     COLONOSCOPY WITH PROPOFOL N/A 03/17/2021   Procedure: COLONOSCOPY WITH PROPOFOL;  Surgeon: Lanelle Bal, DO;  Location: [facility];  Service: Endoscopy;  Laterality: N/A;  ASA II / AM procedure   DILATATION AND CURETTAGE/HYSTEROSCOPY WITH MINERVA N/A 10/26/2020   Procedure: DILATATION AND CURETTAGE/HYSTEROSCOPY WITH MINERVA ENDOMETRIAL ABLATION;  Surgeon: Lazaro Arms, MD;  Location: [facility];  Service: Gynecology;  Laterality: N/A;   LAPAROSCOPIC BILATERAL SALPINGECTOMY Bilateral 10/26/2020   Procedure: LAPAROSCOPIC BILATERAL SALPINGECTOMY;  Surgeon: Lazaro Arms, MD;  Location: [facility];  Service: Gynecology;  Laterality: Bilateral;   POLYPECTOMY  03/17/2021   Procedure: POLYPECTOMY;  Surgeon: Lanelle Bal, DO;  Location: [facility];  Service: Endoscopy;;    Social History: Social History   Socioeconomic History   Marital status: Married    Spouse name: Not on file   Number of children: 2   Years of education: Not on file   Highest education level: Bachelor's degree (e.g., BA, AB, BS)  Occupational History   Occupation: Set designer   Tobacco Use   Smoking status: Former    Current packs/day: 0.00    Types: Cigarettes    Quit date: 12/04/2012    Years since quitting: 10.5   Smokeless tobacco: Never  Vaping Use   Vaping status: Never Used  Substance and Sexual Activity   Alcohol use: Yes    Comment: occasional   Drug use: No   Sexual activity: Not Currently    Birth control/protection: Surgical    Comment: tubal and ablation  Other Topics Concern   Not on file  Social History Narrative   Mother of twins    Social  Determinants of Health   Financial Resource Strain: Low Risk  (03/02/2023)   Overall Financial Resource Strain (CARDIA)    Difficulty of Paying Living Expenses: Not hard at all  Food Insecurity: No Food Insecurity (03/02/2023)   Hunger Vital Sign    Worried About Running Out of Food in the Last Year: Never true    Ran Out of Food in the Last Year: Never true  Transportation Needs: No Transportation Needs (03/02/2023)   PRAPARE - Administrator, Civil Service (Medical): No    Lack of Transportation (Non-Medical): No  Physical Activity: Unknown (03/02/2023)   Exercise Vital Sign    Days of Exercise per Week: 0 days    Minutes of Exercise per Session: Not on file  Stress: No Stress Concern Present (03/02/2023)   [employer] of Occupational Health - Occupational Stress Questionnaire  Feeling of Stress : Not at all  Social Connections: Socially Integrated (03/02/2023)   Social Connection and Isolation Panel [NHANES]    Frequency of Communication with Friends and Family: More than three times a week    Frequency of Social Gatherings with Friends and Family: Once a week    Attends Religious Services: More than 4 times per year    Active Member of [employer] or Organizations: Yes    Attends Engineer, structural: More than 4 times per year    Marital Status: Married  Catering manager Violence: Not At Risk (08/25/2020)   Humiliation, Afraid, Rape, and Kick questionnaire    Fear of Current or Ex-Partner: No    Emotionally Abused: No    Physically Abused: No    Sexually Abused: No    Family History: Family History  Problem Relation Age of Onset   GER disease Mother    Diabetes Mother    Hypertension Mother    Heart attack Father 53   Heart disease Father    Migraines Sister    Breast cancer Maternal Grandmother    Stroke Maternal Grandmother    Breast cancer Cousin    Stroke Maternal Aunt    Colon cancer Neg Hx     Current Medications:  Current Outpatient  Medications:    dasatinib (SPRYCEL) 70 MG tablet, TAKE 1 TABLET BY MOUTH ONCE DAILY AT THE SAME TIME. MAY TAKE WITH OR WITHOUT FOOD. SWALLOW WHOLE. AVOID GRAPEFRUIT PRODUCTS., Disp: 30 tablet, Rfl: 6   diltiazem (CARDIZEM CD) 120 MG 24 hr capsule, Take 1 capsule (120 mg total) by mouth daily., Disp: 90 capsule, Rfl: 1   loratadine (CLARITIN) 10 MG tablet, TAKE 1 TABLET(10 MG) BY MOUTH DAILY, Disp: 90 tablet, Rfl: 1   losartan (COZAAR) 100 MG tablet, Take 1 tablet (100 mg total) by mouth daily., Disp: 90 tablet, Rfl: 1   Multiple Vitamins-Minerals (MULTIVITAMIN WITH MINERALS) tablet, Take 1 tablet by mouth at bedtime., Disp: , Rfl:    spironolactone (ALDACTONE) 100 MG tablet, Take 1 tablet (100 mg total) by mouth daily., Disp: 90 tablet, Rfl: 1   Vitamin D, Ergocalciferol, (DRISDOL) 1.25 MG (50000 UNIT) CAPS capsule, Take 1 capsule (50,000 Units total) by mouth every 7 (seven) days., Disp: 12 capsule, Rfl: 2   Allergies: Allergies  Allergen Reactions   Amlodipine     Headache    Tomato Itching and Swelling    Processed tomatoes in the can.     REVIEW OF SYSTEMS:   Review of Systems  Constitutional:  Negative for chills, fatigue and fever.  HENT:   Negative for lump/mass, mouth sores, nosebleeds, sore throat and trouble swallowing.   Eyes:  Negative for eye problems.  Respiratory:  Negative for cough and shortness of breath.   Cardiovascular:  Negative for chest pain, leg swelling and palpitations.  Gastrointestinal:  Negative for abdominal pain, constipation, diarrhea, nausea and vomiting.  Genitourinary:  Negative for bladder incontinence, difficulty urinating, dysuria, frequency, hematuria and nocturia.   Musculoskeletal:  Negative for arthralgias, back pain, flank pain, myalgias and neck pain.  Skin:  Negative for itching and rash.  Neurological:  Negative for dizziness, headaches and numbness.  Hematological:  Does not bruise/bleed easily.  Psychiatric/Behavioral:  Negative for  depression, sleep disturbance and suicidal ideas. The patient is not nervous/anxious.   All other systems reviewed and are negative.    VITALS:   Blood pressure 132/67, pulse 67, temperature 98.4 F (36.9 C), temperature source Oral, resp. rate  16, weight 221 lb 14.4 oz (100.7 kg), SpO2 100%.  Wt Readings from Last 3 Encounters:  06/05/23 221 lb 14.4 oz (100.7 kg)  03/07/23 222 lb 3.2 oz (100.8 kg)  03/06/23 220 lb 12.8 oz (100.2 kg)    Body mass index is 35.82 kg/m.  Performance status (ECOG): 0 - Asymptomatic   PHYSICAL EXAM:   Physical Exam Vitals and nursing note reviewed. Exam conducted with a chaperone present.  Constitutional:      Appearance: Normal appearance.  Cardiovascular:     Rate and Rhythm: Normal rate and regular rhythm.     Pulses: Normal pulses.     Heart sounds: Normal heart sounds.  Pulmonary:     Effort: Pulmonary effort is normal.     Breath sounds: Normal breath sounds.  Abdominal:     Palpations: Abdomen is soft. There is no hepatomegaly, splenomegaly or mass.     Tenderness: There is no abdominal tenderness.  Musculoskeletal:     Right lower leg: No edema.     Left lower leg: No edema.  Lymphadenopathy:     Cervical: No cervical adenopathy.     Right cervical: No superficial, deep or posterior cervical adenopathy.    Left cervical: No superficial, deep or posterior cervical adenopathy.     Upper Body:     Right upper body: No supraclavicular or axillary adenopathy.     Left upper body: No supraclavicular or axillary adenopathy.  Neurological:     General: No focal deficit present.     Mental Status: She is alert and oriented to person, place, and time.  Psychiatric:        Mood and Affect: Mood normal.        Behavior: Behavior normal.     LABS:      Latest Ref Rng & Units 10/09/2021    2:48 PM 06/27/2021    3:40 PM 03/22/2021   10:36 AM  CBC  WBC 4.0 - 10.5 K/uL 6.8  6.7  5.8   Hemoglobin 12.0 - 15.0 g/dL 69.6  29.5  28.4    Hematocrit 36.0 - 46.0 % 35.7  36.4  38.1   Platelets 150 - 400 K/uL 261  300  256       Latest Ref Rng & Units 02/19/2023   12:02 PM 10/09/2021    2:48 PM 06/27/2021    3:40 PM  CMP  Glucose 70 - 99 mg/dL 88  84  94   BUN 6 - 24 mg/dL 16  17  14    Creatinine 0.57 - 1.00 mg/dL 1.32  4.40  1.02   Sodium 134 - 144 mmol/L 141  139  136   Potassium 3.5 - 5.2 mmol/L 4.3  3.5  3.6   Chloride 96 - 106 mmol/L 106  106  106   CO2 20 - 29 mmol/L 21  26  27    Calcium 8.7 - 10.2 mg/dL 8.9  8.5  8.4   Total Protein 6.0 - 8.5 g/dL 6.9  6.8  6.9   Total Bilirubin 0.0 - 1.2 mg/dL 0.3  0.2  0.6   Alkaline Phos 44 - 121 IU/L 59  54  62   AST 0 - 40 IU/L 20  36  27   ALT 0 - 32 IU/L 18  41  43      No results found for: "CEA1", "CEA" / No results found for: "CEA1", "CEA" No results found for: "PSA1" No results found for: "VOZ366" No results found  for: "ZOX096"  No results found for: "TOTALPROTELP", "ALBUMINELP", "A1GS", "A2GS", "BETS", "BETA2SER", "GAMS", "MSPIKE", "SPEI" Lab Results  Component Value Date   TIBC 427 06/30/2020   TIBC 365 04/28/2020   FERRITIN 20 06/30/2020   FERRITIN 20 04/28/2020   FERRITIN 13 (L) 04/01/2019   IRONPCTSAT 13 06/30/2020   IRONPCTSAT 13 04/28/2020   Lab Results  Component Value Date   LDH 159 10/09/2021   LDH 154 06/27/2021   LDH 144 03/22/2021     STUDIES:   US ABDOMEN LIMITED RUQ (LIVER/GB)  Result Date: 06/03/2023 CLINICAL DATA:  Hepatic cysts. EXAM: ULTRASOUND ABDOMEN LIMITED RIGHT UPPER QUADRANT COMPARISON:  CTA chest 02/08/2020 FINDINGS: Gallbladder: Large stone in the gallbladder lumen measuring 18 mm. No gallbladder wall thickening or pericholecystic fluid. Negative sonographic Murphy's sign. Common bile duct: Diameter: 3.6 mm Liver: Multiple cysts in the liver measuring up to 11 cm in the right hepatic lobe and 3.2 cm in the left hepatic lobe. Mild increased parenchymal echogenicity. Portal vein is patent on color Doppler imaging with normal  direction of blood flow towards the liver. Other: None. IMPRESSION: 1. Cholelithiasis without secondary signs of acute cholecystitis. 2. Multiple hepatic cysts. 3. Increased hepatic parenchymal echogenicity suggestive of steatosis. Electronically Signed   By: Annia Belt M.D.   On: 06/03/2023 07:53

## 2023-06-05 ENCOUNTER — Inpatient Hospital Stay: Payer: BC Managed Care – PPO | Attending: Hematology | Admitting: Hematology

## 2023-06-05 VITALS — BP 132/67 | HR 67 | Temp 98.4°F | Resp 16 | Wt 221.9 lb

## 2023-06-05 DIAGNOSIS — R161 Splenomegaly, not elsewhere classified: Secondary | ICD-10-CM | POA: Diagnosis not present

## 2023-06-05 DIAGNOSIS — Z79899 Other long term (current) drug therapy: Secondary | ICD-10-CM | POA: Diagnosis not present

## 2023-06-05 DIAGNOSIS — D649 Anemia, unspecified: Secondary | ICD-10-CM | POA: Diagnosis not present

## 2023-06-05 DIAGNOSIS — R7989 Other specified abnormal findings of blood chemistry: Secondary | ICD-10-CM | POA: Diagnosis not present

## 2023-06-05 DIAGNOSIS — C9210 Chronic myeloid leukemia, BCR/ABL-positive, not having achieved remission: Secondary | ICD-10-CM | POA: Diagnosis not present

## 2023-06-05 DIAGNOSIS — I10 Essential (primary) hypertension: Secondary | ICD-10-CM | POA: Diagnosis not present

## 2023-06-05 DIAGNOSIS — E669 Obesity, unspecified: Secondary | ICD-10-CM | POA: Diagnosis not present

## 2023-06-05 DIAGNOSIS — Z803 Family history of malignant neoplasm of breast: Secondary | ICD-10-CM | POA: Insufficient documentation

## 2023-06-05 DIAGNOSIS — G4733 Obstructive sleep apnea (adult) (pediatric): Secondary | ICD-10-CM | POA: Diagnosis not present

## 2023-06-05 DIAGNOSIS — Z87891 Personal history of nicotine dependence: Secondary | ICD-10-CM | POA: Diagnosis not present

## 2023-06-05 NOTE — Patient Instructions (Addendum)
[facility] at [facility] Discharge Instructions   You were seen and examined today by Dr. Ellin Saba.  He reviewed the results of your lab work which are normal/stable.   Continue Sprycel as prescribed.   We will see you back in . We will have you repeat lab work prior to your next visit.    Thank you for choosing [facility] at [facility] to provide your oncology and hematology care.  To afford each patient quality time with our provider, please arrive at least 15 minutes before your scheduled appointment time.   If you have a lab appointment with the Cancer Center please come in thru the Main Entrance and check in at the main information desk.  You need to re-schedule your appointment should you arrive 10 or more minutes late.  We strive to give you quality time with our providers, and arriving late affects you and other patients whose appointments are after yours.  Also, if you no show three or more times for appointments you may be dismissed from the clinic at the providers discretion.     Again, thank you for choosing [facility].  Our hope is that these requests will decrease the amount of time that you wait before being seen by our physicians.       _____________________________________________________________  Should you have questions after your visit to [facility], please contact our office at [phone] and follow the prompts.  Our office hours are 8:00 a.m. and 4:30 p.m. Monday - Friday.  Please note that voicemails left after 4:00 p.m. may not be returned until the following business day.  We are closed weekends and major holidays.  You do have access to a nurse 24-7, just call the main number to the clinic [phone] and do not press any options, hold on the line and a nurse will answer the phone.    For prescription refill requests, have your pharmacy contact our office and allow 72 hours.    Due  to Covid, you will need to wear a mask upon entering the hospital. If you do not have a mask, a mask will be given to you at the Main Entrance upon arrival. For doctor visits, patients may have 1 support person age 47 or older with them. For treatment visits, patients can not have anyone with them due to social distancing guidelines and our immunocompromised population.

## 2023-06-05 NOTE — Progress Notes (Signed)
Patient is taking Sprycel as prescribed. She has not missed any doses and reports no side effects at this time.   

## 2023-06-06 ENCOUNTER — Telehealth: Payer: Self-pay | Admitting: Family Medicine

## 2023-06-06 DIAGNOSIS — K8020 Calculus of gallbladder without cholecystitis without obstruction: Secondary | ICD-10-CM

## 2023-06-06 NOTE — Telephone Encounter (Signed)
I spoke witrh pt,  based on size of stone I recommend Surgical consult, does have excess belching and had severe nauasea last Friday

## 2023-06-06 NOTE — Telephone Encounter (Signed)
Patient called to schedule appointment for follow up discuss Ultrasound for the 18 mm gal stone next opening 09.05.2024 can patient be worked in sooner ? Patient asked for call back from provider to discuss [place] possible surgery.

## 2023-06-17 ENCOUNTER — Encounter: Payer: Self-pay | Admitting: Hematology

## 2023-06-18 ENCOUNTER — Other Ambulatory Visit: Payer: Self-pay

## 2023-06-18 DIAGNOSIS — I10 Essential (primary) hypertension: Secondary | ICD-10-CM

## 2023-06-18 MED ORDER — SPIRONOLACTONE 100 MG PO TABS: 100.0000 mg | ORAL_TABLET | Freq: Every day | ORAL | 1 refills | Status: DC

## 2023-06-18 MED ORDER — DILTIAZEM HCL ER COATED BEADS 120 MG PO CP24
120.0000 mg | ORAL_CAPSULE | Freq: Every day | ORAL | 1 refills | Status: DC
Start: 2023-06-18 — End: 2023-11-07

## 2023-07-07 DIAGNOSIS — J069 Acute upper respiratory infection, unspecified: Secondary | ICD-10-CM | POA: Diagnosis not present

## 2023-07-07 DIAGNOSIS — R030 Elevated blood-pressure reading, without diagnosis of hypertension: Secondary | ICD-10-CM | POA: Diagnosis not present

## 2023-07-07 DIAGNOSIS — R051 Acute cough: Secondary | ICD-10-CM | POA: Diagnosis not present

## 2023-07-07 DIAGNOSIS — R509 Fever, unspecified: Secondary | ICD-10-CM | POA: Diagnosis not present

## 2023-07-11 ENCOUNTER — Ambulatory Visit: Payer: BC Managed Care – PPO | Admitting: General Surgery

## 2023-07-17 DIAGNOSIS — I10 Essential (primary) hypertension: Secondary | ICD-10-CM | POA: Diagnosis not present

## 2023-07-17 DIAGNOSIS — E785 Hyperlipidemia, unspecified: Secondary | ICD-10-CM | POA: Diagnosis not present

## 2023-07-17 DIAGNOSIS — E8881 Metabolic syndrome: Secondary | ICD-10-CM | POA: Diagnosis not present

## 2023-07-18 ENCOUNTER — Encounter: Payer: Self-pay | Admitting: Family Medicine

## 2023-07-18 ENCOUNTER — Ambulatory Visit (INDEPENDENT_AMBULATORY_CARE_PROVIDER_SITE_OTHER): Payer: BC Managed Care – PPO | Admitting: Family Medicine

## 2023-07-18 VITALS — BP 110/60 | HR 66 | Ht 66.0 in | Wt 222.0 lb

## 2023-07-18 DIAGNOSIS — K760 Fatty (change of) liver, not elsewhere classified: Secondary | ICD-10-CM

## 2023-07-18 DIAGNOSIS — E785 Hyperlipidemia, unspecified: Secondary | ICD-10-CM

## 2023-07-18 DIAGNOSIS — Z0000 Encounter for general adult medical examination without abnormal findings: Secondary | ICD-10-CM | POA: Diagnosis not present

## 2023-07-18 DIAGNOSIS — I10 Essential (primary) hypertension: Secondary | ICD-10-CM | POA: Diagnosis not present

## 2023-07-18 DIAGNOSIS — K7689 Other specified diseases of liver: Secondary | ICD-10-CM

## 2023-07-18 DIAGNOSIS — R7401 Elevation of levels of liver transaminase levels: Secondary | ICD-10-CM

## 2023-07-18 DIAGNOSIS — R7303 Prediabetes: Secondary | ICD-10-CM

## 2023-07-18 DIAGNOSIS — Z1231 Encounter for screening mammogram for malignant neoplasm of breast: Secondary | ICD-10-CM

## 2023-07-18 DIAGNOSIS — E669 Obesity, unspecified: Secondary | ICD-10-CM

## 2023-07-18 NOTE — Patient Instructions (Addendum)
F/U in 4 months, call if  you need me sooner  Please schedule January mammogram at checkout  Work on changing food choice , cholesterol and blood sugar have both increased  You are referred to GI and to nutrition educator  Please get flu vaccine next month  Fasting lipid, cmp and EGFr and hBA1C 1 week before next appt  CONGRATS excellent BP  It is important that you exercise regularly at least 30 minutes 5 times a week. If you develop chest pain, have severe difficulty breathing, or feel very tired, stop exercising immediately and seek medical attention   Think about what you will eat, plan ahead. Choose " clean, green, fresh or frozen" over canned, processed or packaged foods which are more sugary, salty and fatty. 70 to 75% of food eaten should be vegetables and fruit. Three meals at set times with snacks allowed between meals, but they must be fruit or vegetables. Aim to eat over a 12 hour period , example 7 am to 7 pm, and STOP after  your last meal of the day. Drink water,generally about 64 ounces per day, no other drink is as healthy. Fruit juice is best enjoyed in a healthy way, by EATING the fruit.   Thanks for choosing [facility], we consider it a privelige to serve you.

## 2023-07-21 DIAGNOSIS — Z0000 Encounter for general adult medical examination without abnormal findings: Secondary | ICD-10-CM | POA: Insufficient documentation

## 2023-07-21 NOTE — Assessment & Plan Note (Signed)
Annual exam as documented. . Immunization and cancer screening needs are specifically addressed at this visit.  

## 2023-07-21 NOTE — Progress Notes (Signed)
    Marcia Hahn     MRN: [id number]      DOB: [birth year]  Chief Complaint  Patient presents with   Abdominal Pain    Gal stone   Annual Exam    Physical needed.    Flu Vaccine    Vaccine declined.     HPI: Patient is in for annual physical exam.  Recent labs,  are reviewed.plan is to refer to GI re fatty liver and to nutrition re persistent obesity with metabolic syndrome Immunization is reviewed , and  she will have flu vaccinenext month.   PE: BP 110/60   Pulse 66   Ht 5\' 6"  (1.676 m)   Wt 222 lb (100.7 kg)   SpO2 95%   BMI 35.83 kg/m   Pleasant  female, alert and oriented x 3, in no cardio-pulmonary distress. Afebrile. HEENT No facial trauma or asymetry. Sinuses non tender.  Extra occullar muscles intact.. External ears normal, . Neck: supple, no adenopathy,JVD or thyromegaly.No bruits.  Chest: Clear to ascultation bilaterally.No crackles or wheezes. Non tender to palpation  Breast: Asymptomatic and mammogram  is UTD Not examined  Cardiovascular system; Heart sounds normal,  S1 and  S2 ,no S3.  No murmur, or thrill. Apical beat not displaced Peripheral pulses normal.  Abdomen: Soft, non tender,  GU: Not examined, no concerns expressed and pap is UTD   Musculoskeletal exam: Full ROM of spine, hips , shoulders and knees. No deformity ,swelling or crepitus noted. No muscle wasting or atrophy.   Neurologic: Cranial nerves 2 to 12 intact. Power, tone ,sensation and reflexes normal throughout. No disturbance in gait. No tremor.  Skin: Intact, no ulceration, erythema , scaling or rash noted. Pigmentation normal throughout  Psych; Normal mood and affect. Judgement and concentration normal   Assessment & Plan:  Annual physical exam Annual exam as documented.  Immunization and cancer screening needs are specifically addressed at this visit.

## 2023-08-08 ENCOUNTER — Ambulatory Visit: Payer: BC Managed Care – PPO | Admitting: General Surgery

## 2023-08-08 ENCOUNTER — Encounter: Payer: Self-pay | Admitting: General Surgery

## 2023-08-08 VITALS — BP 128/70 | HR 65 | Temp 98.1°F | Resp 12 | Ht 66.0 in | Wt 217.0 lb

## 2023-08-08 DIAGNOSIS — K7689 Other specified diseases of liver: Secondary | ICD-10-CM

## 2023-08-08 DIAGNOSIS — K8020 Calculus of gallbladder without cholecystitis without obstruction: Secondary | ICD-10-CM

## 2023-08-08 HISTORY — DX: Other specified diseases of liver: K76.89

## 2023-08-08 NOTE — Progress Notes (Signed)
[facility] History and Physical  Reason for Referral:Gallstones  Referring Physician:  Dr. Lodema Hong  Chief Complaint   New Patient (Initial Visit)     ROLANDE MOE is a 47 y.o. female.  HPI: Ms. Runion is a 47 yo who incidentally found out she had gallstones after she had a ECHO that showed concern for a hepatic cyst and recommended a RUQ [place]. She was getting her ECHO due to a concern for a murmur noted by Cardiology in April. During the ECHO they saw a hepatic cyst and then also stones. Dr. Lodema Hong referred her to GI for the hepatic cyst and fatty liver and to surgery for the stones.   The patient denies any RUQ pain or pain with eating. She has had no issues with greasy/fatty food.   Past Medical History:  Diagnosis Date   Abnormal mammogram of right breast 09/03/2018   Acne vulgaris    CML (chronic myelocytic leukemia) (HCC)    Essential hypertension 02/27/2008   Qualifier: Diagnosis of   By: Doreene Nest LPN, Jaime       Hypertension    Mild obesity    Murmur 03/07/2023   OSA (obstructive sleep apnea) 03/07/2023    Past Surgical History:  Procedure Laterality Date   BREAST BIOPSY Left    CESAREAN SECTION     COLONOSCOPY WITH PROPOFOL N/A 03/17/2021   Procedure: COLONOSCOPY WITH PROPOFOL;  Surgeon: Lanelle Bal, DO;  Location: [facility];  Service: Endoscopy;  Laterality: N/A;  ASA II / AM procedure   DILATATION AND CURETTAGE/HYSTEROSCOPY WITH MINERVA N/A 10/26/2020   Procedure: DILATATION AND CURETTAGE/HYSTEROSCOPY WITH MINERVA ENDOMETRIAL ABLATION;  Surgeon: Lazaro Arms, MD;  Location: [facility] ORS;  Service: Gynecology;  Laterality: N/A;   LAPAROSCOPIC BILATERAL SALPINGECTOMY Bilateral 10/26/2020   Procedure: LAPAROSCOPIC BILATERAL SALPINGECTOMY;  Surgeon: Lazaro Arms, MD;  Location: [facility] OR[facility];  Service: Gynecology;  Laterality: Bilateral;   POLYPECTOMY  03/17/2021   Procedure: POLYPECTOMY;  Surgeon: Lanelle Bal, DO;  Location: [facility];   Service: Endoscopy;;    Family History  Problem Relation Age of Onset   GER disease Mother    Diabetes Mother    Hypertension Mother    Heart attack Father 71   Heart disease Father    Migraines Sister    Breast cancer Maternal Grandmother    Stroke Maternal Grandmother    Breast cancer Cousin    Stroke Maternal Aunt    Colon cancer Neg Hx     Social History   Tobacco Use   Smoking status: Former    Current packs/day: 0.00    Types: Cigarettes    Quit date: 12/04/2012    Years since quitting: 10.6   Smokeless tobacco: Never  Vaping Use   Vaping status: Never Used  Substance Use Topics   Alcohol use: Yes    Comment: occasional   Drug use: No    Medications: I have reviewed the patient's current medications. Allergies as of 08/08/2023       Reactions   Amlodipine    Headache    Tomato Itching, Swelling   Processed tomatoes in the can.         Medication List        Accurate as of August 08, 2023  1:22 PM. If you have any questions, ask your nurse or doctor.          dasatinib 70 MG tablet Commonly known as: Sprycel TAKE 1 TABLET BY MOUTH ONCE  DAILY AT THE SAME TIME. MAY TAKE WITH OR WITHOUT FOOD. SWALLOW WHOLE. AVOID GRAPEFRUIT PRODUCTS.   diltiazem 120 MG 24 hr capsule Commonly known as: CARDIZEM CD Take 1 capsule (120 mg total) by mouth daily.   loratadine 10 MG tablet Commonly known as: CLARITIN TAKE 1 TABLET(10 MG) BY MOUTH DAILY   losartan 100 MG tablet Commonly known as: COZAAR Take 1 tablet (100 mg total) by mouth daily.   multivitamin with minerals tablet Take 1 tablet by mouth at bedtime.   spironolactone 100 MG tablet Commonly known as: ALDACTONE Take 1 tablet (100 mg total) by mouth daily.   Vitamin D (Ergocalciferol) 1.25 MG (50000 UNIT) Caps capsule Commonly known as: DRISDOL Take 1 capsule (50,000 Units total) by mouth every 7 (seven) days.         ROS:  A comprehensive review of systems was negative except for:  Cardiovascular: positive for HTN  Blood pressure 128/70, pulse 65, temperature 98.1 F (36.7 C), temperature source Oral, resp. rate 12, height 5\' 6"  (1.676 m), weight 217 lb (98.4 kg), SpO2 98%. Physical Exam Vitals reviewed.  HENT:     Head: Normocephalic.     Nose: Nose normal.  Eyes:     Extraocular Movements: Extraocular movements intact.  Cardiovascular:     Rate and Rhythm: Normal rate and regular rhythm.  Pulmonary:     Effort: Pulmonary effort is normal.     Breath sounds: Normal breath sounds.  Abdominal:     General: There is no distension.     Palpations: Abdomen is soft.     Tenderness: There is no abdominal tenderness.  Musculoskeletal:        General: Normal range of motion.     Cervical back: Normal range of motion.  Skin:    General: Skin is warm.  Neurological:     General: No focal deficit present.     Mental Status: She is alert and oriented to person, place, and time.  Psychiatric:        Mood and Affect: Mood normal.        Behavior: Behavior normal.        Thought Content: Thought content normal.     Results: personally reviewed  CLINICAL DATA:  Hepatic cysts.   EXAM: ULTRASOUND ABDOMEN LIMITED RIGHT UPPER QUADRANT   COMPARISON:  CTA chest 02/08/2020   FINDINGS: Gallbladder:   Large stone in the gallbladder lumen measuring 18 mm. No gallbladder wall thickening or pericholecystic fluid. Negative sonographic Murphy's sign.   Common bile duct:   Diameter: 3.6 mm   Liver:   Multiple cysts in the liver measuring up to 11 cm in the right hepatic lobe and 3.2 cm in the left hepatic lobe. Mild increased parenchymal echogenicity. Portal vein is patent on color Doppler imaging with normal direction of blood flow towards the liver.   Other: None.   IMPRESSION: 1. Cholelithiasis without secondary signs of acute cholecystitis. 2. Multiple hepatic cysts. 3. Increased hepatic parenchymal echogenicity suggestive of steatosis.      Electronically Signed   By: Annia Belt M.D.   On: 06/03/2023 07:53   Assessment & Plan:  SABRIA FLORIDO is a 47 y.o. female with gallstones and no signs of cholecystitis and hepatic cyst/ fatty liver. She is not symptomatic. Discussed that lots of women have stones and discussed symptoms. Discussed option of monitoring her symptoms and her diet and seeing if there is issues. Discussed she does have a 18 mm stone but it  is not impacted and could cause cholecystitis in the future or may never cause any issues. Discussed if she was really worried we could remove the gallbladder but that comes with risk of surgery including bleeding, infection, injury to bile ducts, need for more extensive surgery.   She is going to monitor things for now PRN follow up    Lucretia Roers 08/08/2023, 1:22 PM

## 2023-08-08 NOTE — Patient Instructions (Signed)
Minimally Invasive Cholecystectomy Minimally invasive cholecystectomy is surgery to remove the gallbladder. The gallbladder is a pear-shaped organ that lies beneath the liver on the right side of the body. The gallbladder stores bile, which is a fluid that helps the body digest fats. Cholecystectomy is often done to treat inflammation (irritation and swelling) of the gallbladder (cholecystitis). This condition is usually caused by a buildup of gallstones (cholelithiasis) in the gallbladder or when the fluid in the gall bladder becomes stagnant because gallstones get stuck in the ducts (tubes) and block the flow of bile. This can result in inflammation and pain. In severe cases, emergency surgery may be required. This procedure is done through small incisions in the abdomen, instead of one large incision. It is also called laparoscopic surgery. A thin scope with a camera (laparoscope) is inserted through one incision. Then surgical instruments are inserted through the other incisions. In some cases, a minimally invasive surgery may need to be changed to a surgery that is done through a larger incision. This is called open surgery. Tell a health care provider about: Any allergies you have. All medicines you are taking, including vitamins, herbs, eye drops, creams, and over-the-counter medicines. Any problems you or family members have had with anesthetic medicines. Any bleeding problems you have. Any surgeries you have had. Any medical conditions you have. Whether you are pregnant or may be pregnant. What are the risks? Generally, this is a safe procedure. However, problems may occur, including: Infection. Bleeding. Allergic reactions to medicines. Damage to nearby structures or organs. A gallstone remaining in the common bile duct. The common bile duct carries bile from the gallbladder to the small intestine. A bile leak from the liver or cystic duct after your gallbladder is removed. What happens  before the procedure? Medicines Ask your health care provider about: Changing or stopping your regular medicines. This is especially important if you are taking diabetes medicines or blood thinners. Taking medicines such as aspirin and ibuprofen. These medicines can thin your blood. Do not take these medicines unless your health care provider tells you to take them. Taking over-the-counter medicines, vitamins, herbs, and supplements. General instructions If you will be going home right after the procedure, plan to have a responsible adult: Take you home from the hospital or clinic. You will not be allowed to drive. Care for you for the time you are told. Do not use any products that contain nicotine or tobacco for at least 4 weeks before the procedure. These products include cigarettes, chewing tobacco, and vaping devices, such as e-cigarettes. If you need help quitting, ask your health care provider. Ask your health care provider: How your surgery site will be marked. What steps will be taken to help prevent infection. These may include: Removing hair at the surgery site. Washing skin with a germ-killing soap. Taking antibiotic medicine. What happens during the procedure?  An IV will be inserted into one of your veins. You will be given one or both of the following: A medicine to help you relax (sedative). A medicine to make you fall asleep (general anesthetic). Your surgeon will make several small incisions in your abdomen. The laparoscope will be inserted through one of the small incisions. The camera on the laparoscope will send images to a monitor in the operating room. This lets your surgeon see inside your abdomen. A gas will be pumped into your abdomen. This will expand your abdomen to give the surgeon more room to perform the surgery. Other tools that  are needed for the procedure will be inserted through the other incisions. The gallbladder will be removed through one of the  incisions. Your common bile duct may be examined. If stones are found in the common bile duct, they may be removed. After your gallbladder has been removed, the incisions will be closed with stitches (sutures), staples, or skin glue. Your incisions will be covered with a bandage (dressing). The procedure may vary among health care providers and hospitals. What happens after the procedure? Your blood pressure, heart rate, breathing rate, and blood oxygen level will be monitored until you leave the hospital or clinic. You will be given medicines as needed to control your pain. You may have a drain placed in the incision. The drain will be removed a day or two after the procedure. Summary Minimally invasive cholecystectomy, also called laparoscopic cholecystectomy, is surgery to remove the gallbladder using small incisions. Tell your health care provider about all the medical conditions you have and all the medicines you are taking for those conditions. Before the procedure, follow instructions about when to stop eating and drinking and changing or stopping medicines. Plan to have a responsible adult care for you for the time you are told after you leave the hospital or clinic. This information is not intended to replace advice given to you by your health care provider. Make sure you discuss any questions you have with your health care provider. Document Revised: 05/02/2021 Document Reviewed: 05/02/2021 Elsevier Patient Education  2024 Elsevier Inc.   Cholelithiasis  Cholelithiasis is a disease in which gallstones form in the gallbladder. The gallbladder is an organ that stores bile. Bile is a fluid that helps to digest fats. Gallstones begin as small crystals and can slowly grow into stones. They may cause no symptoms until they block the gallbladder duct, or cystic duct, when the gallbladder tightens, or contracts, after food is eaten. This can cause pain and is known as a gallbladder attack, or  biliary colic. There are two main types of gallstones: Cholesterol stones. These are the most common type of gallstone. These stones are made of hardened cholesterol and are usually yellow-green in color. Pigment stones. These are dark in color and are made of a red-yellow substance, called bilirubin,that forms when hemoglobin from red blood cells breaks down. What are the causes? This condition may be caused by too little or too much of the substances that are in bile. This can happen if the bile: Has too much bilirubin. This can happen in certain blood diseases, such as sickle cell anemia. Has too much cholesterol. Does not have enough bile salts. These salts help the body absorb and digest fats. It can also happen if the gallbladder is not emptying completely. This is common during pregnancy. What increases the risk? The following factors may make you more likely to develop this condition: Being older than 47 years of age. Eating a diet that is heavy in fried foods, fat, and refined carbohydrates, such as white bread and white rice. Being female. Having multiple pregnancies. Using medicines that contain female hormones (estrogen) for a long time. Having certain medical problems, such as: Diabetes mellitus. Obesity. Cystic fibrosis. Crohn's disease. Cirrhosis or other long-term (chronic) liver disease. Certain blood diseases, such as sickle cell anemia or leukemia. Having a family history of gallstones. Losing weight quickly. What are the signs or symptoms? In many cases, having gallstones causes no symptoms. These are called silent gallstones. If a gallstone blocks your bile duct, it can  cause a gallbladder attack. The main symptom of a gallbladder attack is sudden pain in the upper right part of the abdomen. The pain: Usually comes at night or after eating. Can last for one hour or more. Can spread to your right shoulder, back, or chest. Can feel like indigestion. This is  discomfort, burning, or fullness in your upper abdomen. If the bile duct is blocked for more than a few hours, it can cause an infection or inflammation of your gallbladder (cholecystitis), liver, or pancreas. This can cause: Nausea or vomiting. Bloating. Pain in your abdomen that lasts for 5 hours or longer. Tenderness in your upper abdomen, often in the upper right section and under your rib cage. Fever or chills. Skin or the white parts of your eyes turning yellow (jaundice). This usually happens when a stone has blocked bile from passing through the bile duct. Dark pee (urine) or light-colored poop (stools). How is this diagnosed? This condition may be diagnosed based on: A physical exam. Your medical history. Ultrasound. CT scan. MRI. You may also have other tests, including: Blood tests to check for infection or inflammation. The HIDA scan to see the gallbladder and the bile ducts. An endoscope to check for blockage in the bile ducts. How is this treated? Treatment depends on the severity of your symptoms. Silent gallstones do not need treatment. You may need treatment if a blockage causes a gallbladder attack or other symptoms. Treatment may include: If symptoms are mild, you may care for yourself at home. For mild symptoms: Stop eating and drinking for 12-24 hours. You may drink water and clear liquids. This helps to "cool down" your gallbladder. After 1 or 2 days, eat a diet of simple or clear foods, such as broths and crackers. Take medicines for pain or nausea. Take antibiotics if you have an infection. If symptoms are severe, you may: Stay in the hospital for pain control or to treat severe infection. Have surgery to remove the gallbladder (cholecystectomy). This is the most common treatment if all other treatments have not worked. Take medicines to break up gallstones. Medicines may be used for up to 6-12 months. Have an procedure to capture and remove gallstones. Follow  these instructions at home: Medicines Take over-the-counter and prescription medicines only as told by your health care provider. If you were prescribed antibiotics, take them as told by your provider. Do not stop using the antibiotic even if you start to feel better. Ask your provider if the medicine prescribed to you requires you to avoid driving or using machinery. Eating and drinking Drink enough fluid to keep your pee pale yellow. This is important during a gallbladder attack. Water and clear liquids are preferred. Follow a healthy diet. This includes: Reducing fatty foods, such as fried food and foods high in cholesterol. Reducing refined carbohydrates, such as white bread and white rice. Eating more fiber. Aim for foods such as almonds, fruit, and beans. General instructions Do not use any products that contain nicotine or tobacco. These products include cigarettes, chewing tobacco, and vaping devices, such as e-cigarettes. If you need help quitting, ask your provider. Maintain a healthy weight. Keep all follow-up visits. These may include seeing a specialist or a Careers adviser. Where to find more information [employer] of Diabetes and Digestive and Kidney Diseases: [email or web address] Contact a health care provider if: You think you have had a gallbladder attack. You have been diagnosed with silent gallstones and you develop indigestion or pain in your abdomen. You have  pain from a gallbladder attack that lasts for more than 2 hours. You begin to have attacks more often. You have nausea. You have dark pee or light-colored poop. Get help right away if: You have pain in your abdomen that lasts for more than 5 hours or is getting worse. You have a fever or chills. You have vomiting that does not go away. You develop jaundice. This information is not intended to replace advice given to you by your health care provider. Make sure you discuss any questions you have with your health care  provider. Document Revised: 08/13/2022 Document Reviewed: 08/13/2022 Elsevier Patient Education  2024 [employer].

## 2023-08-09 ENCOUNTER — Encounter: Payer: Self-pay | Admitting: Gastroenterology

## 2023-08-09 ENCOUNTER — Ambulatory Visit: Payer: BC Managed Care – PPO | Admitting: Gastroenterology

## 2023-08-09 VITALS — BP 132/81 | HR 78 | Temp 98.6°F | Ht 66.0 in | Wt 218.2 lb

## 2023-08-09 DIAGNOSIS — K760 Fatty (change of) liver, not elsewhere classified: Secondary | ICD-10-CM | POA: Insufficient documentation

## 2023-08-09 DIAGNOSIS — R7401 Elevation of levels of liver transaminase levels: Secondary | ICD-10-CM | POA: Insufficient documentation

## 2023-08-09 DIAGNOSIS — Z1211 Encounter for screening for malignant neoplasm of colon: Secondary | ICD-10-CM | POA: Insufficient documentation

## 2023-08-09 DIAGNOSIS — K7689 Other specified diseases of liver: Secondary | ICD-10-CM | POA: Diagnosis not present

## 2023-08-09 HISTORY — DX: Elevation of levels of liver transaminase levels: R74.01

## 2023-08-09 NOTE — Progress Notes (Signed)
GI Office Note    Referring Provider: Kerri Perches, MD Primary Care Physician:  Kerri Perches, MD  Primary Gastroenterologist: Hennie Duos. Marletta Lor, DO   Chief Complaint   Chief Complaint  Patient presents with   fatty liver    Elevated ALT, hepatic cyst     History of Present Illness   Marcia Hahn is a 47 y.o. female presenting today at the request of Dr. Lodema Hong for elevated ALT, hepatic cyst, fatty liver.   Labs 07/2023: A1c 5.8, creatinine 1.04, albumin 4.1, total bilirubin 0.3, alkaline phosphatase 97, AST 29, ALT 40, triglycerides 153, LDL 125, total cholesterol 191.  History of incomplete colonoscopy in 03/2021 due to inadequate bowel prep. She had single hyperplastic polyp removed and advised to return in six months for another colonoscopy. She states she was unable to get down the suprep.   Feels well. BM every day to every other day. No melena, brbpr. No hard stools. No abdominal pain. No n/v. No heartburn. No dysphagia. Saw general surgery yesterday due to cholelithiasis, per patient since she is asymptomatic they recommended to monitor for now. Showed patient pictures of stool throughout her colon during her colonoscopy, she states she is ready to proceed with colonoscopy. She has also be working to reduce her weight. This year she has lost about 10 pounds.    Ruq u/s 05/2023: IMPRESSION: 1. Cholelithiasis without secondary signs of acute cholecystitis. 2. Multiple hepatic cysts, measuring up to 11cm in right lobe and 3.2cm in the left lobe. 3. Increased hepatic parenchymal echogenicity suggestive of steatosis.   Medications   Current Outpatient Medications  Medication Sig Dispense Refill   dasatinib (SPRYCEL) 70 MG tablet TAKE 1 TABLET BY MOUTH ONCE DAILY AT THE SAME TIME. MAY TAKE WITH OR WITHOUT FOOD. SWALLOW WHOLE. AVOID GRAPEFRUIT PRODUCTS. 30 tablet 6   diltiazem (CARDIZEM CD) 120 MG 24 hr capsule Take 1 capsule (120 mg total) by mouth  daily. 90 capsule 1   losartan (COZAAR) 100 MG tablet Take 1 tablet (100 mg total) by mouth daily. 90 tablet 1   Multiple Vitamins-Minerals (MULTIVITAMIN WITH MINERALS) tablet Take 1 tablet by mouth at bedtime.     spironolactone (ALDACTONE) 100 MG tablet Take 1 tablet (100 mg total) by mouth daily. 90 tablet 1   Vitamin D, Ergocalciferol, (DRISDOL) 1.25 MG (50000 UNIT) CAPS capsule Take 1 capsule (50,000 Units total) by mouth every 7 (seven) days. 12 capsule 2   No current facility-administered medications for this visit.    Allergies   Allergies as of 08/09/2023 - Review Complete 08/09/2023  Allergen Reaction Noted   Amlodipine  08/04/2019   Tomato Itching and Swelling 11/01/2017    Past Medical History   Past Medical History:  Diagnosis Date   Abnormal mammogram of right breast 09/03/2018   Acne vulgaris    CML (chronic myelocytic leukemia) (HCC)    Essential hypertension 02/27/2008   Qualifier: Diagnosis of   By: Doreene Nest LPN, Jaime       Hypertension    Mild obesity    Murmur 03/07/2023   OSA (obstructive sleep apnea) 03/07/2023    Past Surgical History   Past Surgical History:  Procedure Laterality Date   BREAST BIOPSY Left    CESAREAN SECTION     COLONOSCOPY WITH PROPOFOL N/A 03/17/2021   Procedure: COLONOSCOPY WITH PROPOFOL;  Surgeon: Lanelle Bal, DO;  Location: [facility];  Service: Endoscopy;  Laterality: N/A;  ASA II / AM procedure  DILATATION AND CURETTAGE/HYSTEROSCOPY WITH MINERVA N/A 10/26/2020   Procedure: DILATATION AND CURETTAGE/HYSTEROSCOPY WITH MINERVA ENDOMETRIAL ABLATION;  Surgeon: Lazaro Arms, MD;  Location: [facility];  Service: Gynecology;  Laterality: N/A;   LAPAROSCOPIC BILATERAL SALPINGECTOMY Bilateral 10/26/2020   Procedure: LAPAROSCOPIC BILATERAL SALPINGECTOMY;  Surgeon: Lazaro Arms, MD;  Location: [facility];  Service: Gynecology;  Laterality: Bilateral;   POLYPECTOMY  03/17/2021   Procedure: POLYPECTOMY;  Surgeon: Lanelle Bal, DO;   Location: [facility];  Service: Endoscopy;;    Past Family History   Family History  Problem Relation Age of Onset   GER disease Mother    Diabetes Mother    Hypertension Mother    Heart attack Father 55   Heart disease Father    Migraines Sister    Breast cancer Maternal Grandmother    Stroke Maternal Grandmother    Breast cancer Cousin    Stroke Maternal Aunt    Colon cancer Neg Hx     Past Social History   Social History   Socioeconomic History   Marital status: Married    Spouse name: Not on file   Number of children: 2   Years of education: Not on file   Highest education level: Bachelor's degree (e.g., BA, AB, BS)  Occupational History   Occupation: Set designer   Tobacco Use   Smoking status: Former    Current packs/day: 0.00    Types: Cigarettes    Quit date: 12/04/2012    Years since quitting: 10.6   Smokeless tobacco: Never  Vaping Use   Vaping status: Never Used  Substance and Sexual Activity   Alcohol use: Yes    Comment: occasional   Drug use: No   Sexual activity: Not Currently    Birth control/protection: Surgical    Comment: tubal and ablation  Other Topics Concern   Not on file  Social History Narrative   Mother of twins    Social Determinants of Health   Financial Resource Strain: Low Risk  (03/02/2023)   Overall Financial Resource Strain (CARDIA)    Difficulty of Paying Living Expenses: Not hard at all  Food Insecurity: No Food Insecurity (03/02/2023)   Hunger Vital Sign    Worried About Running Out of Food in the Last Year: Never true    Ran Out of Food in the Last Year: Never true  Transportation Needs: No Transportation Needs (03/02/2023)   PRAPARE - Administrator, Civil Service (Medical): No    Lack of Transportation (Non-Medical): No  Physical Activity: Unknown (03/02/2023)   Exercise Vital Sign    Days of Exercise per Week: 0 days    Minutes of Exercise per Session: Not on file  Stress: No Stress Concern Present  (03/02/2023)   [employer] of Occupational Health - Occupational Stress Questionnaire    Feeling of Stress : Not at all  Social Connections: Socially Integrated (03/02/2023)   Social Connection and Isolation Panel [NHANES]    Frequency of Communication with Friends and Family: More than three times a week    Frequency of Social Gatherings with Friends and Family: Once a week    Attends Religious Services: More than 4 times per year    Active Member of [employer] or Organizations: Yes    Attends Banker Meetings: More than 4 times per year    Marital Status: Married  Catering manager Violence: Not At Risk (08/25/2020)   Humiliation, Afraid, Rape, and Kick questionnaire    Fear of  Current or Ex-Partner: No    Emotionally Abused: No    Physically Abused: No    Sexually Abused: No    Review of Systems   General: Negative for anorexia, weight loss, fever, chills, fatigue, weakness. Eyes: Negative for vision changes.  ENT: Negative for hoarseness, difficulty swallowing , nasal congestion. CV: Negative for chest pain, angina, palpitations, dyspnea on exertion, peripheral edema.  Respiratory: Negative for dyspnea at rest, dyspnea on exertion, cough, sputum, wheezing.  GI: See history of present illness. GU:  Negative for dysuria, hematuria, urinary incontinence, urinary frequency, nocturnal urination.  MS: Negative for joint pain, low back pain.  Derm: Negative for rash or itching.  Neuro: Negative for weakness, abnormal sensation, seizure, frequent headaches, memory loss,  confusion.  Psych: Negative for anxiety, depression, suicidal ideation, hallucinations.  Endo: Negative for unusual weight change.  Heme: Negative for bruising or bleeding. Allergy: Negative for rash or hives.  Physical Exam   BP 132/81 (BP Location: Right Arm, Patient Position: Sitting, Cuff Size: Large)   Pulse 78   Temp 98.6 F (37 C) (Oral)   Ht 5\' 6"  (1.676 m)   Wt 218 lb 3.2 oz (99 kg)   LMP  08/02/2023 (Approximate)   SpO2 97%   BMI 35.22 kg/m    General: Well-nourished, well-developed in no acute distress.  Head: Normocephalic, atraumatic.   Eyes: Conjunctiva pink, no icterus. Mouth: Oropharyngeal mucosa moist and pink  Neck: Supple without thyromegaly, masses, or lymphadenopathy.  Lungs: Clear to auscultation bilaterally.  Heart: Regular rate and rhythm, no murmurs rubs or gallops.  Abdomen: Bowel sounds are normal, nontender, nondistended, no hepatosplenomegaly or masses,  no abdominal bruits or hernia, no rebound or guarding.   Rectal: not performed Extremities: No lower extremity edema. No clubbing or deformities.  Neuro: Alert and oriented x 4 , grossly normal neurologically.  Skin: Warm and dry, no rash or jaundice.   Psych: Alert and cooperative, normal mood and affect.  Labs   Lab Results  Component Value Date   ALT 40 (H) 07/17/2023   AST 29 07/17/2023   ALKPHOS 97 07/17/2023   BILITOT 0.3 07/17/2023   Lab Results  Component Value Date   WBC 6.8 10/09/2021   HGB 11.8 (L) 10/09/2021   HCT 35.7 (L) 10/09/2021   MCV 94.9 10/09/2021   PLT 261 10/09/2021    Imaging Studies   No results found.  Assessment   *Hepatic cysts *Elevated ALT *Fatty liver *Screening colonoscopy  Patient with numerous hepatic cysts, largest measuring 11cm. She is asymptomatic, no abdominal pain. Really nothing to be done, no surveillance needed. She should monitor for any abdominal pain at which point we would consider updating imaging.  Regarding elevated ALT, may be due to [facility]. We discussed further weight reduction (goal of 10% of body weight), increase physical activity. We will obtain labs to evaluate elevated ALT and assess for any fibrosis. She is aware that F3 disease has medication options now therefore advise her to continue to follow with [place] for her steatohepatitis.   She is overdue repeat colonoscopy due to inadequate bowel prep in 2022. She desires proceeding  now.   PLAN   Complete labs. Colonoscopy with Dr. Marletta Lor. ASA 2.  I have discussed the risks, alternatives, benefits with regards to but not limited to the risk of reaction to medication, bleeding, infection, perforation and the patient is agreeable to proceed. Written consent to be obtained. Extended clear liquids, augment prep with bisacodyl. Previous did not tolerate Suprep  due to vomiting. Will try Clenpiq.    Leanna Battles. Melvyn Neth, MHS, PA-C [facility]

## 2023-08-09 NOTE — Patient Instructions (Signed)
Complete labs. We will be in touch with results as available. Colonoscopy to be scheduled.  We will continue to monitor you for fatty liver with labs and ultrasound (ultrasound to be repeated in a couple of years) to watch for any signs of fibrosis. There is medication available for stage 3 fibrosis to help reverse the degree. Labs and ultrasound helps [place] determine if you have any fibrosis and what stage.  For liver cysts, nothing to be done for this. Monitor for any pain in your abdomen, especially in the right upper abdomen. Call with any problems.   Instructions for fatty liver: Recommend 1-2# weight loss per week until ideal body weight through exercise & diet. Try to lose 10% of your body weight (about 20 pounds). Low fat/cholesterol diet.   Avoid sweets, sodas, fruit juices, sweetened beverages like tea, etc. Gradually increase exercise from 15 min daily up to 1 hr per day 5 days/week. Limit alcohol use.

## 2023-08-12 ENCOUNTER — Other Ambulatory Visit: Payer: Self-pay | Admitting: *Deleted

## 2023-08-12 ENCOUNTER — Telehealth: Payer: Self-pay | Admitting: *Deleted

## 2023-08-12 ENCOUNTER — Encounter: Payer: Self-pay | Admitting: *Deleted

## 2023-08-12 DIAGNOSIS — Z1211 Encounter for screening for malignant neoplasm of colon: Secondary | ICD-10-CM

## 2023-08-12 NOTE — Telephone Encounter (Signed)
LMTRC   TCS w/Dr.Carver, ASA 2, UPT Pt got sample of Clenpiq

## 2023-08-13 ENCOUNTER — Encounter: Payer: Self-pay | Admitting: Gastroenterology

## 2023-08-20 DIAGNOSIS — K760 Fatty (change of) liver, not elsewhere classified: Secondary | ICD-10-CM | POA: Diagnosis not present

## 2023-08-20 DIAGNOSIS — K7689 Other specified diseases of liver: Secondary | ICD-10-CM | POA: Diagnosis not present

## 2023-08-20 DIAGNOSIS — R7989 Other specified abnormal findings of blood chemistry: Secondary | ICD-10-CM | POA: Diagnosis not present

## 2023-08-20 DIAGNOSIS — Z1211 Encounter for screening for malignant neoplasm of colon: Secondary | ICD-10-CM | POA: Diagnosis not present

## 2023-08-21 ENCOUNTER — Other Ambulatory Visit: Payer: Self-pay

## 2023-08-21 DIAGNOSIS — R7989 Other specified abnormal findings of blood chemistry: Secondary | ICD-10-CM

## 2023-08-21 DIAGNOSIS — R7401 Elevation of levels of liver transaminase levels: Secondary | ICD-10-CM

## 2023-08-22 LAB — HEPATIC FUNCTION PANEL
ALT: 26 [IU]/L (ref 0–32)
AST: 27 [IU]/L (ref 0–40)
Albumin: 4.1 g/dL (ref 3.9–4.9)
Alkaline Phosphatase: 64 [IU]/L (ref 44–121)
Bilirubin Total: <0.2 mg/dL (ref 0.0–1.2)
Bilirubin, Direct: <0.1 mg/dL (ref 0.00–0.40)
Total Protein: 6.4 g/dL (ref 6.0–8.5)

## 2023-08-22 LAB — NASH FIBROSURE(R) PLUS
ALPHA 2-MACROGLOBULINS, QN: 168 mg/dL (ref 110–276)
ALT (SGPT) P5P: 31 IU/L (ref 0–40)
AST (SGOT) P5P: 28 IU/L (ref 0–40)
Apolipoprotein A-1: 142 mg/dL (ref 116–209)
Cholesterol, Total: 173 mg/dL (ref 100–199)
Fibrosis Score: 0.06 (ref 0.00–0.21)
GGT: 49 IU/L (ref 0–60)
Glucose: 87 mg/dL (ref 70–99)
Haptoglobin: 108 mg/dL (ref 42–296)
NASH Score: 0.3 — ABNORMAL HIGH (ref 0.00–0.25)
Steatosis Score: 0.57 — ABNORMAL HIGH (ref 0.00–0.40)
Triglycerides: 173 mg/dL — ABNORMAL HIGH (ref 0–149)

## 2023-08-22 LAB — CBC WITH DIFFERENTIAL/PLATELET
Basophils Absolute: 0 10*3/uL (ref 0.0–0.2)
Basos: 0 %
EOS (ABSOLUTE): 0.1 10*3/uL (ref 0.0–0.4)
Eos: 1 %
Hematocrit: 33 % — ABNORMAL LOW (ref 34.0–46.6)
Hemoglobin: 10.9 g/dL — ABNORMAL LOW (ref 11.1–15.9)
Immature Grans (Abs): 0 10*3/uL (ref 0.0–0.1)
Immature Granulocytes: 0 %
Lymphocytes Absolute: 2.6 10*3/uL (ref 0.7–3.1)
Lymphs: 38 %
MCH: 31.2 pg (ref 26.6–33.0)
MCHC: 33 g/dL (ref 31.5–35.7)
MCV: 95 fL (ref 79–97)
Monocytes Absolute: 0.4 10*3/uL (ref 0.1–0.9)
Monocytes: 6 %
Neutrophils Absolute: 3.7 10*3/uL (ref 1.4–7.0)
Neutrophils: 55 %
Platelets: 321 10*3/uL (ref 150–450)
RBC: 3.49 x10E6/uL — ABNORMAL LOW (ref 3.77–5.28)
RDW: 12.8 % (ref 11.7–15.4)
WBC: 6.9 10*3/uL (ref 3.4–10.8)

## 2023-08-22 LAB — BASIC METABOLIC PANEL
BUN/Creatinine Ratio: 11 (ref 9–23)
BUN: 12 mg/dL (ref 6–24)
CO2: 21 mmol/L (ref 20–29)
Calcium: 8.8 mg/dL (ref 8.7–10.2)
Chloride: 103 mmol/L (ref 96–106)
Creatinine, Ser: 1.08 mg/dL — ABNORMAL HIGH (ref 0.57–1.00)
Glucose: 82 mg/dL (ref 70–99)
Potassium: 4.1 mmol/L (ref 3.5–5.2)
Sodium: 137 mmol/L (ref 134–144)
eGFR: 64 mL/min/{1.73_m2} (ref >59)

## 2023-08-22 LAB — HEPATITIS A ANTIBODY, TOTAL: hep A Total Ab: NEGATIVE

## 2023-08-22 LAB — ANA: Anti Nuclear Antibody (ANA): NEGATIVE

## 2023-08-22 LAB — IGG, IGA, IGM
IgA/Immunoglobulin A, Serum: 57 mg/dL — ABNORMAL LOW (ref 87–352)
IgG (Immunoglobin G), Serum: 1103 mg/dL (ref 586–1602)
IgM (Immunoglobulin M), Srm: 38 mg/dL (ref 26–217)

## 2023-08-22 LAB — MITOCHONDRIAL/SMOOTH MUSCLE AB PNL
Mitochondrial Ab: <20 U (ref 0.0–20.0)
Smooth Muscle Ab: 3 U (ref 0–19)

## 2023-08-22 LAB — HEPATITIS B SURFACE ANTIBODY,QUALITATIVE: Hep B Surface Ab, Qual: REACTIVE

## 2023-08-22 LAB — HEPATITIS B SURFACE ANTIGEN: Hepatitis B Surface Ag: NEGATIVE

## 2023-08-22 LAB — HEPATITIS C ANTIBODY: Hep C Virus Ab: NONREACTIVE

## 2023-08-22 LAB — TISSUE TRANSGLUTAMINASE, IGA: Transglutaminase IgA: <2 U/mL (ref 0–3)

## 2023-08-30 LAB — IRON,TIBC AND FERRITIN PANEL
Ferritin: 41 ng/mL (ref 15–150)
Iron Saturation: 19 % (ref 15–55)
Iron: 62 ug/dL (ref 27–159)
Total Iron Binding Capacity: 328 ug/dL (ref 250–450)
UIBC: 266 ug/dL (ref 131–425)

## 2023-08-30 LAB — SPECIMEN STATUS REPORT

## 2023-08-30 NOTE — Telephone Encounter (Signed)
Pt is cancelling her procedure on 09/10/23. She wants to be rescheduled in Jan. Advised pt that we do not have providers schedule for Jan at this time. We will give her a call back.

## 2023-09-04 ENCOUNTER — Encounter (HOSPITAL_BASED_OUTPATIENT_CLINIC_OR_DEPARTMENT_OTHER): Payer: BC Managed Care – PPO | Admitting: Cardiovascular Disease

## 2023-09-04 NOTE — Telephone Encounter (Signed)
noted 

## 2023-09-10 ENCOUNTER — Encounter (HOSPITAL_COMMUNITY): Payer: Self-pay

## 2023-09-10 ENCOUNTER — Ambulatory Visit (HOSPITAL_COMMUNITY): Admit: 2023-09-10 | Payer: BC Managed Care – PPO

## 2023-09-10 SURGERY — COLONOSCOPY WITH PROPOFOL
Anesthesia: Monitor Anesthesia Care

## 2023-09-11 ENCOUNTER — Ambulatory Visit: Payer: BC Managed Care – PPO | Admitting: Nutrition

## 2023-09-19 ENCOUNTER — Other Ambulatory Visit: Payer: Self-pay | Admitting: Hematology

## 2023-09-19 DIAGNOSIS — C9210 Chronic myeloid leukemia, BCR/ABL-positive, not having achieved remission: Secondary | ICD-10-CM

## 2023-09-20 ENCOUNTER — Other Ambulatory Visit: Payer: Self-pay

## 2023-09-20 DIAGNOSIS — R7989 Other specified abnormal findings of blood chemistry: Secondary | ICD-10-CM

## 2023-09-20 DIAGNOSIS — R768 Other specified abnormal immunological findings in serum: Secondary | ICD-10-CM

## 2023-10-07 ENCOUNTER — Other Ambulatory Visit: Payer: Self-pay

## 2023-10-07 DIAGNOSIS — R7989 Other specified abnormal findings of blood chemistry: Secondary | ICD-10-CM

## 2023-10-07 DIAGNOSIS — R768 Other specified abnormal immunological findings in serum: Secondary | ICD-10-CM

## 2023-10-08 ENCOUNTER — Inpatient Hospital Stay: Payer: BC Managed Care – PPO | Admitting: Hematology

## 2023-10-23 ENCOUNTER — Encounter: Payer: Self-pay | Admitting: *Deleted

## 2023-10-30 ENCOUNTER — Inpatient Hospital Stay: Payer: BC Managed Care – PPO | Admitting: Hematology

## 2023-10-30 ENCOUNTER — Ambulatory Visit: Payer: BC Managed Care – PPO | Admitting: Nutrition

## 2023-11-04 DIAGNOSIS — C9210 Chronic myeloid leukemia, BCR/ABL-positive, not having achieved remission: Secondary | ICD-10-CM | POA: Diagnosis not present

## 2023-11-07 ENCOUNTER — Other Ambulatory Visit: Payer: Self-pay

## 2023-11-07 DIAGNOSIS — E559 Vitamin D deficiency, unspecified: Secondary | ICD-10-CM

## 2023-11-07 DIAGNOSIS — I10 Essential (primary) hypertension: Secondary | ICD-10-CM

## 2023-11-07 MED ORDER — SPIRONOLACTONE 100 MG PO TABS: 100.0000 mg | ORAL_TABLET | Freq: Every day | ORAL | 1 refills | Status: DC

## 2023-11-07 MED ORDER — DILTIAZEM HCL ER COATED BEADS 120 MG PO CP24: 120.0000 mg | ORAL_CAPSULE | Freq: Every day | ORAL | 1 refills | Status: DC

## 2023-11-07 MED ORDER — VITAMIN D (ERGOCALCIFEROL) 1.25 MG (50000 UNIT) PO CAPS
50000.0000 [IU] | ORAL_CAPSULE | ORAL | 2 refills | Status: DC
Start: 2023-11-07 — End: 2023-11-11

## 2023-11-11 ENCOUNTER — Other Ambulatory Visit: Payer: Self-pay

## 2023-11-11 DIAGNOSIS — E559 Vitamin D deficiency, unspecified: Secondary | ICD-10-CM

## 2023-11-11 DIAGNOSIS — I10 Essential (primary) hypertension: Secondary | ICD-10-CM

## 2023-11-11 MED ORDER — VITAMIN D (ERGOCALCIFEROL) 1.25 MG (50000 UNIT) PO CAPS: 50000.0000 [IU] | ORAL_CAPSULE | ORAL | 2 refills | Status: DC

## 2023-11-11 MED ORDER — DILTIAZEM HCL ER COATED BEADS 120 MG PO CP24
120.0000 mg | ORAL_CAPSULE | Freq: Every day | ORAL | 1 refills | Status: DC
Start: 2023-11-11 — End: 2024-05-11

## 2023-11-11 MED ORDER — SPIRONOLACTONE 100 MG PO TABS: 100.0000 mg | ORAL_TABLET | Freq: Every day | ORAL | 1 refills | Status: DC

## 2023-11-12 ENCOUNTER — Telehealth: Payer: Self-pay

## 2023-11-12 ENCOUNTER — Ambulatory Visit: Payer: Self-pay

## 2023-11-12 NOTE — Telephone Encounter (Signed)
error 

## 2023-11-14 ENCOUNTER — Inpatient Hospital Stay: Payer: BC Managed Care – PPO | Admitting: Hematology

## 2023-11-14 ENCOUNTER — Encounter: Payer: Self-pay | Admitting: *Deleted

## 2023-11-14 NOTE — Progress Notes (Incomplete)
   [facility] [address] [place] [address]    Clinic Day:  11/14/23   Referring physician: Kerri Perches, MD  Patient Care Team: Kerri Perches, MD as PCP - General Doreatha Massed, MD as Consulting Physic

## 2023-11-18 DIAGNOSIS — C9210 Chronic myeloid leukemia, BCR/ABL-positive, not having achieved remission: Secondary | ICD-10-CM | POA: Diagnosis not present

## 2023-11-19 ENCOUNTER — Ambulatory Visit: Payer: BC Managed Care – PPO | Admitting: Family Medicine

## 2023-11-20 ENCOUNTER — Encounter: Payer: Self-pay | Admitting: Hematology

## 2023-11-22 ENCOUNTER — Other Ambulatory Visit: Payer: Self-pay | Admitting: Family Medicine

## 2023-11-28 ENCOUNTER — Ambulatory Visit (HOSPITAL_BASED_OUTPATIENT_CLINIC_OR_DEPARTMENT_OTHER): Payer: BC Managed Care – PPO | Admitting: Cardiovascular Disease

## 2023-11-28 VITALS — BP 156/86 | HR 73 | Ht 66.0 in

## 2023-11-28 DIAGNOSIS — E785 Hyperlipidemia, unspecified: Secondary | ICD-10-CM | POA: Diagnosis not present

## 2023-11-28 DIAGNOSIS — G4733 Obstructive sleep apnea (adult) (pediatric): Secondary | ICD-10-CM

## 2023-11-28 DIAGNOSIS — I10 Essential (primary) hypertension: Secondary | ICD-10-CM

## 2023-11-28 NOTE — Patient Instructions (Addendum)
Medication Instructions:  GO HOME AND TAKE YOUR MEDICATIONS   Labwork: FASTING LP/CMET  IN 4 MONTHS ABOUT A WEEK PRIOR TO FOLLOW UP   Testing/Procedures: NONE  Follow-Up: 04/02/2024 2:30 with Dr Duke Salvia   Exercise recommendations: The American Heart Association recommends 150 minutes of moderate intensity exercise weekly. Try 30 minutes of moderate intensity exercise 4-5 times per week. This could include walking, jogging, or swimming.  If you need a refill on your cardiac medications before your next appointment, please call your pharmacy.

## 2023-11-28 NOTE — Progress Notes (Signed)
[facility] Follow Up:    Date:  11/29/2023   ID:  Marcia Hahn, DOB 1976/05/04, MRN [id number]  PCP:  Kerri Perches, MD  Cardiologist:  None  Nephrologist:  Referring MD: Kerri Perches, MD   CC: Hypertension  History of Present Illness:    Marcia Hahn is a 48 y.o. female with a hx of hypertension, CML, OSA, remote tobacco abuse, and hyperlipidemia here for follow-up.  She was first seen by Gillian Shields, NP in the [facility] 12/2022 prior to that she had seen Dr. Tenny Craw most recently in 2021.  She had been on amlodipine which was discontinued due to edema.  She was first diagnosed with hypertension in her 30s or early 69s.  Blood pressures at home were in the 120s over 60s.  Blood pressure was also controlled in the office.  Therefore no changes were made.  She was referred for a sleep study which did reveal sleep apnea and a CPAP was recommended.  At her visit 02/2023 blood pressures were generally well-controlled.  She only noted elevated readings in the setting of forgetting her medication.  She was noted to have OSA and plan to speak with her dentist about getting an oral airway device.  It was noted that she had a murmur on exam.  She had an echocardiogram 04/2023 that revealed LVEF 60-65% with normal diastolic function.  There were no valvular abnormalities.  [name] reports a recent increase in LDL cholesterol from 105 to 125. She expresses concern about this upward trend, but notes that she has not been diagnosed with coronary disease or diabetes. The patient has been trying to increase physical activity, recently purchasing a home exercise machine. However, she acknowledges that the machine may not provide much cardiovascular exercise.  In terms of diet, she has made efforts to reduce intake of breads and starches, and has largely eliminated dairy. She typically consumes fruit for breakfast and lunch, but admits to occasional  snacking on chips and dip. She expresses interest in exploring more holistic approaches to managing her health, including the use of supplements like [place] and apple cider vinegar.  [name] also reports inconsistent adherence to her blood pressure medication, often skipping doses due to forgetfulness or a busy schedule. She expresses a desire to manage her blood pressure with as little medication as possible, and is open to lifestyle modifications to achieve this goal. Despite these lapses in medication adherence, the patient's blood pressure has generally been well-controlled when on medication.  Her most recent blood pressure reading, however, was significantly elevated at 186/88. She acknowledges the need to take her medication more consistently to avoid such spikes in blood pressure.      Previous antihypertensives: Amlodipine - headache Hydralazine  Clonidine   Past Medical History:  Diagnosis Date   Abnormal mammogram of right breast 09/03/2018   Acne vulgaris    CML (chronic myelocytic leukemia) (HCC)    Essential hypertension 02/27/2008   Qualifier: Diagnosis of   By: Doreene Nest LPN, Jaime       Hypertension    Mild obesity    Murmur 03/07/2023   OSA (obstructive sleep apnea) 03/07/2023    Past Surgical History:  Procedure Laterality Date   BREAST BIOPSY Left    CESAREAN SECTION     COLONOSCOPY WITH PROPOFOL N/A 03/17/2021   Procedure: COLONOSCOPY WITH PROPOFOL;  Surgeon: Lanelle Bal, DO;  [facility]: [facility];  Service: Endoscopy;  Laterality: N/A;  ASA II / AM procedure   DILATATION AND CURETTAGE/HYSTEROSCOPY WITH MINERVA N/A 10/26/2020   Procedure: DILATATION AND CURETTAGE/HYSTEROSCOPY WITH MINERVA ENDOMETRIAL ABLATION;  Surgeon: Lazaro Arms, MD;  Location: [facility];  Service: Gynecology;  Laterality: N/A;   LAPAROSCOPIC BILATERAL SALPINGECTOMY Bilateral 10/26/2020   Procedure: LAPAROSCOPIC BILATERAL SALPINGECTOMY;  Surgeon: Lazaro Arms, MD;  Location: [facility];  Service: Gynecology;  Laterality: Bilateral;   POLYPECTOMY  03/17/2021   Procedure: POLYPECTOMY;  Surgeon: Lanelle Bal, DO;  Location: [facility];  Service: Endoscopy;;    Current Medications: Current Meds  Medication Sig   dasatinib (SPRYCEL) 70 MG tablet TAKE 1 TABLET BY MOUTH ONCE DAILY AT THE SAME TIME. MAY TAKE WITH OR WITHOUT FOOD. SWALLOW WHOLE. AVOID GRAPEFRUIT PRODUCTS.   diltiazem (CARDIZEM CD) 120 MG 24 hr capsule Take 1 capsule (120 mg total) by mouth daily.   losartan (COZAAR) 100 MG tablet TAKE 1 TABLET DAILY   Multiple Vitamins-Minerals (MULTIVITAMIN WITH MINERALS) tablet Take 1 tablet by mouth at bedtime.   spironolactone (ALDACTONE) 100 MG tablet Take 1 tablet (100 mg total) by mouth daily.   Vitamin D, Ergocalciferol, (DRISDOL) 1.25 MG (50000 UNIT) CAPS capsule Take 1 capsule (50,000 Units total) by mouth every 7 (seven) days.     Allergies:   Amlodipine and Tomato   Social History   Socioeconomic History   Marital status: Married    Spouse name: Not on file   Number of children: 2   Years of education: Not on file   Highest education level: Bachelor's degree (e.g., BA, AB, BS)  Occupational History   Occupation: Set designer   Tobacco Use   Smoking status: Former    Current packs/day: 0.00    Types: Cigarettes    Quit date: 12/04/2012    Years since quitting: 10.9   Smokeless tobacco: Never  Vaping Use   Vaping status: Never Used  Substance and Sexual Activity   Alcohol use: Yes    Comment: occasional   Drug use: No   Sexual activity: Not Currently    Birth control/protection: Surgical    Comment: tubal and ablation  Other Topics Concern   Not on file  Social History Narrative   Mother of twins    Social Drivers of Health   Financial Resource Strain: Low Risk  (03/02/2023)   Overall Financial Resource Strain (CARDIA)    Difficulty of Paying Living Expenses: Not hard at all  Food Insecurity: No Food Insecurity (03/02/2023)   Hunger  Vital Sign    Worried About Running Out of Food in the Last Year: Never true    Ran Out of Food in the Last Year: Never true  Transportation Needs: No Transportation Needs (03/02/2023)   PRAPARE - Administrator, Civil Service (Medical): No    Lack of Transportation (Non-Medical): No  Physical Activity: Unknown (03/02/2023)   Exercise Vital Sign    Days of Exercise per Week: 0 days    Minutes of Exercise per Session: Not on file  Stress: No Stress Concern Present (03/02/2023)   [employer] of Occupational Health - Occupational Stress Questionnaire    Feeling of Stress : Not at all  Social Connections: Socially Integrated (03/02/2023)   Social Connection and Isolation Panel [NHANES]    Frequency of Communication with Friends and Family: More than three times a week    Frequency of Social Gatherings with Friends and Family: Once a week    Attends Religious Services: More than 4  times per year    Active Member of Clubs or Organizations: Yes    Attends Engineer, structural: More than 4 times per year    Marital Status: Married     Family History: The patient's family history includes Breast cancer in her cousin and maternal grandmother; Diabetes in her mother; GER disease in her mother; Heart attack (age of onset: 67) in her father; Heart disease in her father; Hypertension in her mother; Migraines in her sister; Stroke in her maternal aunt and maternal grandmother. There is no history of Colon cancer.  ROS:   Please see the history of present illness.    All other systems reviewed and are negative.  EKGs/Labs/Other Studies Reviewed:    Echo 04/2023:  1. Probable liver cysts; suggest liver ultrasound to further assess.   2. Left ventricular ejection fraction, by estimation, is 60 to 65%. The  left ventricle has normal function. The left ventricle has no regional  wall motion abnormalities. Left ventricular diastolic parameters were  normal. The average left  ventricular  global longitudinal strain is -18.7 %. The global longitudinal strain is  normal.   3. Right ventricular systolic function is normal. The right ventricular  size is normal.   4. The mitral valve is normal in structure. No evidence of mitral valve  regurgitation. No evidence of mitral stenosis.   5. The aortic valve is tricuspid. Aortic valve regurgitation is not  visualized. Aortic valve sclerosis is present, with no evidence of aortic  valve stenosis.   6. The inferior vena cava is normal in size with greater than 50%  respiratory variability, suggesting right atrial pressure of 3 mmHg.    Bilateral Renal Artery Doppler  01/14/2023: Summary:  Renal:    Right: Normal size right kidney. Normal right Resisitive Index.         Normal cortical thickness of right kidney. No evidence of         right renal artery stenosis. RRV flow present.  Left:  Normal size of left kidney. Normal left Resistive Index.         Normal cortical thickness of the left kidney. No evidence of         left renal artery stenosis. LRV flow present. Cyst(s) noted.         Mid pole cortical cyst measuring 1.8 cm x 1.9 cm x 1.7 cm.  Mesenteric:  Normal Celiac artery and Superior Mesenteric artery findings.  Patent IVC.   Bilateral Carotid Doppler  04/22/2012: IMPRESSION:  Normal carotid Doppler ultrasound.   EKG:  EKG is personally reviewed. 03/07/2023:  EKG was not ordered.  Recent Labs: 02/19/2023: TSH 1.860 08/20/2023: ALT 26; BUN 12; Creatinine, Ser 1.08; Hemoglobin 10.9; Platelets 321; Potassium 4.1; Sodium 137   Recent Lipid Panel    Component Value Date/Time   CHOL 173 08/20/2023 1330   TRIG 173 (H) 08/20/2023 1330   HDL 39 (L) 07/17/2023 1112   CHOLHDL 4.9 (H) 07/17/2023 1112   CHOLHDL 3.8 06/30/2020 0841   VLDL 18 06/30/2020 0841   LDLCALC 125 (H) 07/17/2023 1112   LDLCALC 85 10/07/2019 1021    Physical Exam:    VS:  BP (!) 156/86   Pulse 73   Ht 5\' 6"  (1.676 m)   SpO2 98%    BMI 35.22 kg/m  , BMI Body mass index is 35.22 kg/m. GENERAL:  Well appearing HEENT: Pupils equal round and reactive, fundi not visualized, oral mucosa unremarkable NECK:  No jugular venous  distention, waveform within normal limits, carotid upstroke brisk and symmetric, no bruits, no thyromegaly LYMPHATICS:  No cervical adenopathy HEART:  RRR.  PMI not displaced or sustained,S1 and S2 within normal limits, no S3, no S4, no clicks, no rubs, 2/6 systolic murmur at the LUSB ABD:  Flat, positive bowel sounds normal in frequency in pitch, no bruits, no rebound, no guarding, no midline pulsatile mass, no hepatomegaly, no splenomegaly EXT:  2 plus pulses throughout, no edema, no cyanosis no clubbing SKIN:  No rashes no nodules NEURO:  Cranial nerves II through XII grossly intact, motor grossly intact throughout PSYCH:  Cognitively intact, oriented to person place and time   ASSESSMENT/PLAN:    # Hyperlipidemia LDL increased from 105 to 130. No history of coronary disease or diabetes. Discussed the importance of diet, exercise, and medication adherence in managing cholesterol levels. -Continue monitoring diet and increase physical activity. -Recheck cholesterol levels in 4 months.  # Hypertension Elevated blood pressure at today's visit, possibly due to inconsistent medication adherence. Discussed the importance of consistent medication use and lifestyle modifications. -Encouraged consistent use of antihypertensive medication. -Continue monitoring diet and increase physical activity. -Recheck blood pressure in 4 months.      Screening for Secondary Hypertension:     12/14/2022    6:14 PM  Causes  Renovascular HTN Screened  Sleep Apnea Screened  Thyroid Disease Screened  Hyperaldosteronism Not Screened  Pheochromocytoma Not Screened  Cushing's Syndrome Not Screened  Hyperparathyroidism Not Screened  Coarctation of the Aorta Screened     - Comments BP symmetrical    Relevant  Labs/Studies:    Latest Ref Rng & Units 08/20/2023    1:30 PM 07/17/2023   11:12 AM 02/19/2023   12:02 PM  Basic Labs  Sodium 134 - 144 mmol/L 137  139  141   Potassium 3.5 - 5.2 mmol/L 4.1  4.2  4.3   Creatinine 0.57 - 1.00 mg/dL 8.65  7.84  6.96        Latest Ref Rng & Units 02/19/2023   12:02 PM 11/21/2021   11:20 AM  Thyroid   TSH 0.450 - 4.500 uIU/mL 1.860  2.160                 01/14/2023   10:22 AM  Renovascular   Renal Artery [place] Completed Yes     Disposition:    FU with Djibril Glogowski C. Duke Salvia, MD, [facility] in 4 months.  Medication Adjustments/Labs and Tests Ordered: Current medicines are reviewed at length with the patient today.  Concerns regarding medicines are outlined above.   Orders Placed This Encounter  Procedures   Lipid panel   Comprehensive metabolic panel   No orders of the defined types were placed in this encounter.    Signed, Chilton Si, MD  11/29/2023 11:35 AM    [facility]

## 2023-11-29 ENCOUNTER — Encounter (HOSPITAL_BASED_OUTPATIENT_CLINIC_OR_DEPARTMENT_OTHER): Payer: Self-pay | Admitting: Cardiovascular Disease

## 2023-12-02 ENCOUNTER — Inpatient Hospital Stay: Payer: BC Managed Care – PPO | Attending: Hematology | Admitting: Hematology

## 2023-12-02 ENCOUNTER — Ambulatory Visit: Payer: BC Managed Care – PPO | Admitting: Hematology

## 2023-12-02 VITALS — BP 148/91 | HR 65 | Temp 97.5°F | Resp 18 | Ht 66.0 in | Wt 224.6 lb

## 2023-12-02 DIAGNOSIS — C9210 Chronic myeloid leukemia, BCR/ABL-positive, not having achieved remission: Secondary | ICD-10-CM | POA: Diagnosis not present

## 2023-12-02 DIAGNOSIS — Z87891 Personal history of nicotine dependence: Secondary | ICD-10-CM | POA: Insufficient documentation

## 2023-12-02 DIAGNOSIS — R7402 Elevation of levels of lactic acid dehydrogenase (LDH): Secondary | ICD-10-CM | POA: Insufficient documentation

## 2023-12-02 DIAGNOSIS — D649 Anemia, unspecified: Secondary | ICD-10-CM | POA: Insufficient documentation

## 2023-12-02 DIAGNOSIS — R161 Splenomegaly, not elsewhere classified: Secondary | ICD-10-CM | POA: Diagnosis not present

## 2023-12-02 DIAGNOSIS — Z79899 Other long term (current) drug therapy: Secondary | ICD-10-CM | POA: Insufficient documentation

## 2023-12-02 DIAGNOSIS — Z803 Family history of malignant neoplasm of breast: Secondary | ICD-10-CM | POA: Insufficient documentation

## 2023-12-02 DIAGNOSIS — I10 Essential (primary) hypertension: Secondary | ICD-10-CM | POA: Diagnosis not present

## 2023-12-02 NOTE — Patient Instructions (Signed)
 [facility] at [facility] Discharge Instructions   You were seen and examined today by Dr. Ellin Saba.  He reviewed the results of your lab work which are normal/stable.   We will see you back in 4 months. We will repeat lab work prior to this visit.   Return as scheduled.    Thank you for choosing [facility] at [facility] to provide your oncology and hematology care.  To afford each patient quality time with our provider, please arrive at least 15 minutes before your scheduled appointment time.   If you have a lab appointment with the Cancer Center please come in thru the Main Entrance and check in at the main information desk.  You need to re-schedule your appointment should you arrive 10 or more minutes late.  We strive to give you quality time with our providers, and arriving late affects you and other patients whose appointments are after yours.  Also, if you no show three or more times for appointments you may be dismissed from the clinic at the providers discretion.     Again, thank you for choosing [facility].  Our hope is that these requests will decrease the amount of time that you wait before being seen by our physicians.       _____________________________________________________________  Should you have questions after your visit to [facility], please contact our office at [phone] and follow the prompts.  Our office hours are 8:00 a.m. and 4:30 p.m. Monday - Friday.  Please note that voicemails left after 4:00 p.m. may not be returned until the following business day.  We are closed weekends and major holidays.  You do have access to a nurse 24-7, just call the main number to the clinic [phone] and do not press any options, hold on the line and a nurse will answer the phone.    For prescription refill requests, have your pharmacy contact our office and allow 72 hours.    Due to Covid, you will  need to wear a mask upon entering the hospital. If you do not have a mask, a mask will be given to you at the Main Entrance upon arrival. For doctor visits, patients may have 1 support person age 78 or older with them. For treatment visits, patients can not have anyone with them due to social distancing guidelines and our immunocompromised population.

## 2023-12-02 NOTE — Progress Notes (Signed)
Patient is taking Sprycel as prescribed. She has not missed any doses and reports no side effects at this time.   

## 2023-12-02 NOTE — Progress Notes (Signed)
[facility] [address] [place] [address]    Clinic Day:  12/02/23   Referring physician: Kerri Perches, MD  Patient Care Team: Kerri Perches, MD as PCP - General Doreatha Massed, MD as Consulting Physician (Hematology) Lanelle Bal, DO as Consulting Physician (Internal Medicine)   ASSESSMENT & PLAN:   Assessment: 1.  CML in chronic phase: -Presentation with hyperleukocytosis.  BCR/ABL by FISH positive. -CT chest PE protocol showed splenomegaly measuring 14 cm. -BMBX on 02/23/2020 shows hypercellular marrow with myeloid hyperplasia consistent with CML.  No increase in blasts.  Flow cytometry less than 1% blasts.  Chromosome analysis shows Philadelphia chromosome. -Dasatinib 70 mg daily started on 04/01/2020. -BCR/ABL by quantitative PCR on 06/30/2020 shows B3 A2 transcript improved to 2.19 from 128 previously.  B2 A2 and E1 A2 transcripts are undetectable. -BCR/ABL by quantitative PCR on 11/21/2020 -. - BCR/ABL from 03/22/2021 was negative. - BCR/ABL on 06/27/2021 was undetectable. - BCR/ABL on 01/31/2022 ([facility])-0.037% - BCR/ABL (P210 fusion transcript) on 06/19/2022-0.004% - [facility] BCR/ABL PCR on 11/02/2022: P210 transcript at 1.532%. - BCR/ABL by PCR (02/19/2023) negative - BCR/ABL by PCR (05/23/2023): Negative - BCR/ABL by PCR (11/04/2023): Negative    Plan: 1.  CML in chronic phase: - She is tolerating Dasatinib very well. - We reviewed labs from [facility] from 11/04/2023: CBC was normal.  LDH was minimally elevated.  CMP and LFTs were normal.  BCR/ABL by PCR reported on 11/18/2023 was undetectable. - No signs/symptoms of fluid retention. - Continue Dasatinib 70 mg daily.  RTC 4 months for follow-up. - We discussed about treatment free interval option if her PCR levels are undetectable for 2+ years.   2.  Hypertension: - Continue spironolactone, losartan and Cardizem.  Blood pressure today is 148/91.  She reports that it stays  normal at home.   3.  Normocytic anemia: - Hemoglobin is normal at 11.3.  No orders of the defined types were placed in this encounter.      Alben Deeds Teague,acting as a Neurosurgeon for Doreatha Massed, MD.,have documented all relevant documentation on the behalf of Doreatha Massed, MD,as directed by  Doreatha Massed, MD while in the presence of Doreatha Massed, MD.  I, Doreatha Massed MD, have reviewed the above documentation for accuracy and completeness, and I agree with the above.    Doreatha Massed, MD   1/20/202512:03 PM  CHIEF COMPLAINT:   Diagnosis: CML    Cancer Staging  No matching staging information was found for the patient.    Prior Therapy: none  Current Therapy:  Dasatinib 70 mg daily    HISTORY OF PRESENT ILLNESS:   Oncology History   No history exists.     INTERVAL HISTORY:   Marcia Hahn is a 48 y.o. female seen for follow-up of CML.  She was last seen by me on 06/05/2023.  In the interim, she denied any infections.  Reported appetite at 100% and energy levels at 90%.  PAST MEDICAL HISTORY:   Past Medical History: Past Medical History:  Diagnosis Date   Abnormal mammogram of right breast 09/03/2018   Acne vulgaris    CML (chronic myelocytic leukemia) (HCC)    Essential hypertension 02/27/2008   Qualifier: Diagnosis of   By: Doreene Nest LPN, Jaime       Hypertension    Mild obesity    Murmur 03/07/2023   OSA (obstructive sleep apnea) 03/07/2023    Surgical History: Past Surgical History:  Procedure Laterality Date  BREAST BIOPSY Left    CESAREAN SECTION     COLONOSCOPY WITH PROPOFOL N/A 03/17/2021   Procedure: COLONOSCOPY WITH PROPOFOL;  Surgeon: Lanelle Bal, DO;  Location: [facility];  Service: Endoscopy;  Laterality: N/A;  ASA II / AM procedure   DILATATION AND CURETTAGE/HYSTEROSCOPY WITH MINERVA N/A 10/26/2020   Procedure: DILATATION AND CURETTAGE/HYSTEROSCOPY WITH MINERVA ENDOMETRIAL ABLATION;  Surgeon: Lazaro Arms, MD;  Location: [facility] OR[facility];  Service: Gynecology;  Laterality: N/A;   LAPAROSCOPIC BILATERAL SALPINGECTOMY Bilateral 10/26/2020   Procedure: LAPAROSCOPIC BILATERAL SALPINGECTOMY;  Surgeon: Lazaro Arms, MD;  Location: [facility];  Service: Gynecology;  Laterality: Bilateral;   POLYPECTOMY  03/17/2021   Procedure: POLYPECTOMY;  Surgeon: Lanelle Bal, DO;  Location: [facility];  Service: Endoscopy;;    Social History: Social History   Socioeconomic History   Marital status: Married    Spouse name: Not on file   Number of children: 2   Years of education: Not on file   Highest education level: Bachelor's degree (e.g., BA, AB, BS)  Occupational History   Occupation: Set designer   Tobacco Use   Smoking status: Former    Current packs/day: 0.00    Types: Cigarettes    Quit date: 12/04/2012    Years since quitting: 11.0   Smokeless tobacco: Never  Vaping Use   Vaping status: Never Used  Substance and Sexual Activity   Alcohol use: Yes    Comment: occasional   Drug use: No   Sexual activity: Not Currently    Birth control/protection: Surgical    Comment: tubal and ablation  Other Topics Concern   Not on file  Social History Narrative   Mother of twins    Social Drivers of Health   Financial Resource Strain: Low Risk  (03/02/2023)   Overall Financial Resource Strain (CARDIA)    Difficulty of Paying Living Expenses: Not hard at all  Food Insecurity: No Food Insecurity (03/02/2023)   Hunger Vital Sign    Worried About Running Out of Food in the Last Year: Never true    Ran Out of Food in the Last Year: Never true  Transportation Needs: No Transportation Needs (03/02/2023)   PRAPARE - Administrator, Civil Service (Medical): No    Lack of Transportation (Non-Medical): No  Physical Activity: Unknown (03/02/2023)   Exercise Vital Sign    Days of Exercise per Week: 0 days    Minutes of Exercise per Session: Not on file  Stress: No Stress Concern Present  (03/02/2023)   [employer] of Occupational Health - Occupational Stress Questionnaire    Feeling of Stress : Not at all  Social Connections: Socially Integrated (03/02/2023)   Social Connection and Isolation Panel [NHANES]    Frequency of Communication with Friends and Family: More than three times a week    Frequency of Social Gatherings with Friends and Family: Once a week    Attends Religious Services: More than 4 times per year    Active Member of [employer] or Organizations: Yes    Attends Engineer, structural: More than 4 times per year    Marital Status: Married  Catering manager Violence: Not At Risk (08/25/2020)   Humiliation, Afraid, Rape, and Kick questionnaire    Fear of Current or Ex-Partner: No    Emotionally Abused: No    Physically Abused: No    Sexually Abused: No    Family History: Family History  Problem Relation Age of Onset  GER disease Mother    Diabetes Mother    Hypertension Mother    Heart attack Father 26   Heart disease Father    Migraines Sister    Breast cancer Maternal Grandmother    Stroke Maternal Grandmother    Breast cancer Cousin    Stroke Maternal Aunt    Colon cancer Neg Hx     Current Medications:  Current Outpatient Medications:    dasatinib (SPRYCEL) 70 MG tablet, TAKE 1 TABLET BY MOUTH ONCE DAILY AT THE SAME TIME. MAY TAKE WITH OR WITHOUT FOOD. SWALLOW WHOLE. AVOID GRAPEFRUIT PRODUCTS., Disp: 30 tablet, Rfl: 3   diltiazem (CARDIZEM CD) 120 MG 24 hr capsule, Take 1 capsule (120 mg total) by mouth daily., Disp: 90 capsule, Rfl: 1   losartan (COZAAR) 100 MG tablet, TAKE 1 TABLET DAILY, Disp: 90 tablet, Rfl: 3   Multiple Vitamins-Minerals (MULTIVITAMIN WITH MINERALS) tablet, Take 1 tablet by mouth at bedtime., Disp: , Rfl:    spironolactone (ALDACTONE) 100 MG tablet, Take 1 tablet (100 mg total) by mouth daily., Disp: 90 tablet, Rfl: 1   Vitamin D, Ergocalciferol, (DRISDOL) 1.25 MG (50000 UNIT) CAPS capsule, Take 1 capsule  (50,000 Units total) by mouth every 7 (seven) days., Disp: 12 capsule, Rfl: 2   Allergies: Allergies  Allergen Reactions   Amlodipine     Headache    Tomato Itching and Swelling    Processed tomatoes in the can.     REVIEW OF SYSTEMS:   Review of Systems  Constitutional:  Negative for chills, fatigue and fever.  HENT:   Negative for lump/mass, mouth sores, nosebleeds, sore throat and trouble swallowing.   Eyes:  Negative for eye problems.  Respiratory:  Negative for cough and shortness of breath.   Cardiovascular:  Negative for chest pain, leg swelling and palpitations.  Gastrointestinal:  Negative for abdominal pain, constipation, diarrhea, nausea and vomiting.  Genitourinary:  Negative for bladder incontinence, difficulty urinating, dysuria, frequency, hematuria and nocturia.   Musculoskeletal:  Negative for arthralgias, back pain, flank pain, myalgias and neck pain.  Skin:  Negative for itching and rash.  Neurological:  Negative for dizziness, headaches and numbness.  Hematological:  Does not bruise/bleed easily.  Psychiatric/Behavioral:  Negative for depression, sleep disturbance and suicidal ideas. The patient is not nervous/anxious.   All other systems reviewed and are negative.    VITALS:   Blood pressure (!) 148/91, pulse 65, temperature (!) 97.5 F (36.4 C), temperature source Oral, resp. rate 18, height 5\' 6"  (1.676 m), weight 224 lb 9.6 oz (101.9 kg), SpO2 100%.  Wt Readings from Last 3 Encounters:  12/02/23 224 lb 9.6 oz (101.9 kg)  08/09/23 218 lb 3.2 oz (99 kg)  08/08/23 217 lb (98.4 kg)    Body mass index is 36.25 kg/m.  Performance status (ECOG): 0 - Asymptomatic   PHYSICAL EXAM:   Physical Exam Vitals and nursing note reviewed. Exam conducted with a chaperone present.  Constitutional:      Appearance: Normal appearance.  Cardiovascular:     Rate and Rhythm: Normal rate and regular rhythm.     Pulses: Normal pulses.     Heart sounds: Normal heart  sounds.  Pulmonary:     Effort: Pulmonary effort is normal.     Breath sounds: Normal breath sounds.  Abdominal:     Palpations: Abdomen is soft. There is no hepatomegaly, splenomegaly or mass.     Tenderness: There is no abdominal tenderness.  Musculoskeletal:     Right  lower leg: No edema.     Left lower leg: No edema.  Lymphadenopathy:     Cervical: No cervical adenopathy.     Right cervical: No superficial, deep or posterior cervical adenopathy.    Left cervical: No superficial, deep or posterior cervical adenopathy.     Upper Body:     Right upper body: No supraclavicular or axillary adenopathy.     Left upper body: No supraclavicular or axillary adenopathy.  Neurological:     General: No focal deficit present.     Mental Status: She is alert and oriented to person, place, and time.  Psychiatric:        Mood and Affect: Mood normal.        Behavior: Behavior normal.    LABS:      Latest Ref Rng & Units 08/20/2023    1:30 PM 10/09/2021    2:48 PM 06/27/2021    3:40 PM  CBC  WBC 3.4 - 10.8 x10E3/uL 6.9  6.8  6.7   Hemoglobin 11.1 - 15.9 g/dL 51.8  84.1  66.0   Hematocrit 34.0 - 46.6 % 33.0  35.7  36.4   Platelets 150 - 450 x10E3/uL 321  261  300       Latest Ref Rng & Units 08/20/2023    1:30 PM 07/17/2023   11:12 AM 02/19/2023   12:02 PM  CMP  Glucose 70 - 99 mg/dL 82  83  88   BUN 6 - 24 mg/dL 12  14  16    Creatinine 0.57 - 1.00 mg/dL 6.30  1.60  1.09   Sodium 134 - 144 mmol/L 137  139  141   Potassium 3.5 - 5.2 mmol/L 4.1  4.2  4.3   Chloride 96 - 106 mmol/L 103  102  106   CO2 20 - 29 mmol/L 21  24  21    Calcium 8.7 - 10.2 mg/dL 8.8  8.9  8.9   Total Protein 6.0 - 8.5 g/dL 6.4  6.5  6.9   Total Bilirubin 0.0 - 1.2 mg/dL <3.2  0.3  0.3   Alkaline Phos 44 - 121 IU/L 64  97  59   AST 0 - 40 IU/L 27  29  20    ALT 0 - 32 IU/L 26  40  18      No results found for: "CEA1", "CEA" / No results found for: "CEA1", "CEA" No results found for: "PSA1" No results found  for: "TFT732" No results found for: "CAN125"  No results found for: "TOTALPROTELP", "ALBUMINELP", "A1GS", "A2GS", "BETS", "BETA2SER", "GAMS", "MSPIKE", "SPEI" Lab Results  Component Value Date   TIBC 328 08/20/2023   TIBC 427 06/30/2020   TIBC 365 04/28/2020   FERRITIN 41 08/20/2023   FERRITIN 20 06/30/2020   FERRITIN 20 04/28/2020   IRONPCTSAT 19 08/20/2023   IRONPCTSAT 13 06/30/2020   IRONPCTSAT 13 04/28/2020   Lab Results  Component Value Date   LDH 159 10/09/2021   LDH 154 06/27/2021   LDH 144 03/22/2021     STUDIES:   No results found.

## 2023-12-09 ENCOUNTER — Ambulatory Visit (HOSPITAL_COMMUNITY)
Admission: RE | Admit: 2023-12-09 | Discharge: 2023-12-09 | Disposition: A | Discharge status: Home / Self Care | Payer: BC Managed Care – PPO | Source: Ambulatory Visit | Attending: Family Medicine | Admitting: Family Medicine

## 2023-12-09 DIAGNOSIS — Z1231 Encounter for screening mammogram for malignant neoplasm of breast: Secondary | ICD-10-CM | POA: Insufficient documentation

## 2023-12-11 ENCOUNTER — Ambulatory Visit: Payer: BC Managed Care – PPO | Admitting: Nutrition

## 2023-12-12 ENCOUNTER — Telehealth: Payer: Self-pay | Admitting: Nutrition

## 2023-12-12 NOTE — Telephone Encounter (Signed)
Left VM on patient's cell phone to have her call back to r/s her cancelled appointment that she showed up for yesterday. Also called and talked with her daughter to explain the messaging process. She verbalized understanding and will reach out to her mom to have her call and r/s appointment.

## 2024-01-14 ENCOUNTER — Encounter: Payer: Self-pay | Admitting: Gastroenterology

## 2024-01-15 ENCOUNTER — Other Ambulatory Visit: Payer: Self-pay | Admitting: Hematology

## 2024-01-15 DIAGNOSIS — C9210 Chronic myeloid leukemia, BCR/ABL-positive, not having achieved remission: Secondary | ICD-10-CM

## 2024-02-27 DIAGNOSIS — L720 Epidermal cyst: Secondary | ICD-10-CM | POA: Diagnosis not present

## 2024-02-27 DIAGNOSIS — L728 Other follicular cysts of the skin and subcutaneous tissue: Secondary | ICD-10-CM | POA: Diagnosis not present

## 2024-02-27 DIAGNOSIS — B078 Other viral warts: Secondary | ICD-10-CM | POA: Diagnosis not present

## 2024-02-27 DIAGNOSIS — L820 Inflamed seborrheic keratosis: Secondary | ICD-10-CM | POA: Diagnosis not present

## 2024-02-27 DIAGNOSIS — L818 Other specified disorders of pigmentation: Secondary | ICD-10-CM | POA: Diagnosis not present

## 2024-02-28 DIAGNOSIS — Z6837 Body mass index (BMI) 37.0-37.9, adult: Secondary | ICD-10-CM | POA: Diagnosis not present

## 2024-02-28 DIAGNOSIS — J22 Unspecified acute lower respiratory infection: Secondary | ICD-10-CM | POA: Diagnosis not present

## 2024-02-28 DIAGNOSIS — R059 Cough, unspecified: Secondary | ICD-10-CM | POA: Diagnosis not present

## 2024-02-28 DIAGNOSIS — I10 Essential (primary) hypertension: Secondary | ICD-10-CM | POA: Diagnosis not present

## 2024-03-01 ENCOUNTER — Emergency Department (HOSPITAL_COMMUNITY)
Admission: EM | Admit: 2024-03-01 | Discharge: 2024-03-01 | Disposition: A | Discharge status: Home / Self Care | Attending: Emergency Medicine | Admitting: Emergency Medicine

## 2024-03-01 ENCOUNTER — Encounter (HOSPITAL_COMMUNITY): Payer: Self-pay

## 2024-03-01 ENCOUNTER — Emergency Department (HOSPITAL_COMMUNITY)

## 2024-03-01 ENCOUNTER — Other Ambulatory Visit: Payer: Self-pay

## 2024-03-01 DIAGNOSIS — R1084 Generalized abdominal pain: Secondary | ICD-10-CM | POA: Diagnosis not present

## 2024-03-01 DIAGNOSIS — R1010 Upper abdominal pain, unspecified: Secondary | ICD-10-CM | POA: Insufficient documentation

## 2024-03-01 DIAGNOSIS — Z79899 Other long term (current) drug therapy: Secondary | ICD-10-CM | POA: Insufficient documentation

## 2024-03-01 DIAGNOSIS — K8020 Calculus of gallbladder without cholecystitis without obstruction: Secondary | ICD-10-CM | POA: Diagnosis not present

## 2024-03-01 DIAGNOSIS — K7689 Other specified diseases of liver: Secondary | ICD-10-CM | POA: Diagnosis not present

## 2024-03-01 DIAGNOSIS — R1011 Right upper quadrant pain: Secondary | ICD-10-CM | POA: Diagnosis not present

## 2024-03-01 LAB — COMPREHENSIVE METABOLIC PANEL WITH GFR
ALT: 34 U/L (ref 0–44)
AST: 26 U/L (ref 15–41)
Albumin: 3.5 g/dL (ref 3.5–5.0)
Alkaline Phosphatase: 74 U/L (ref 38–126)
Anion gap: 10 (ref 5–15)
BUN: 16 mg/dL (ref 6–20)
CO2: 24 mmol/L (ref 22–32)
Calcium: 7.8 mg/dL — ABNORMAL LOW (ref 8.9–10.3)
Chloride: 103 mmol/L (ref 98–111)
Creatinine, Ser: 1.1 mg/dL — ABNORMAL HIGH (ref 0.44–1.00)
GFR, Estimated: >60 mL/min (ref >60)
Glucose, Bld: 97 mg/dL (ref 70–99)
Potassium: 2.9 mmol/L — ABNORMAL LOW (ref 3.5–5.1)
Sodium: 137 mmol/L (ref 135–145)
Total Bilirubin: 0.3 mg/dL (ref 0.0–1.2)
Total Protein: 6.8 g/dL (ref 6.5–8.1)

## 2024-03-01 LAB — URINALYSIS, ROUTINE W REFLEX MICROSCOPIC
Bacteria, UA: NONE SEEN
Bilirubin Urine: NEGATIVE
Glucose, UA: NEGATIVE mg/dL
Ketones, ur: NEGATIVE mg/dL
Leukocytes,Ua: NEGATIVE
Nitrite: NEGATIVE
Protein, ur: 30 mg/dL — AB
Specific Gravity, Urine: <1.005 — ABNORMAL LOW (ref 1.005–1.030)
pH: 6 (ref 5.0–8.0)

## 2024-03-01 LAB — CBC
HCT: 44.1 % (ref 36.0–46.0)
Hemoglobin: 14 g/dL (ref 12.0–15.0)
MCH: 30.1 pg (ref 26.0–34.0)
MCHC: 31.7 g/dL (ref 30.0–36.0)
MCV: 94.8 fL (ref 80.0–100.0)
Platelets: 64 10*3/uL — ABNORMAL LOW (ref 150–400)
RBC: 4.65 MIL/uL (ref 3.87–5.11)
RDW: 12.3 % (ref 11.5–15.5)
WBC: 6.6 10*3/uL (ref 4.0–10.5)
nRBC: 0 % (ref 0.0–0.2)

## 2024-03-01 LAB — LIPASE, BLOOD: Lipase: 29 U/L (ref 11–51)

## 2024-03-01 LAB — HCG, QUANTITATIVE, PREGNANCY: hCG, Beta Chain, Quant, S: <1 m[IU]/mL (ref <5)

## 2024-03-01 MED ORDER — IOHEXOL 300 MG/ML  SOLN
100.0000 mL | Freq: Once | INTRAMUSCULAR | Status: AC | PRN
  Administered 2024-03-01: 100 mL via INTRAVENOUS

## 2024-03-01 MED ORDER — ONDANSETRON 4 MG PO TBDP: ORAL_TABLET | ORAL | 0 refills | Status: DC

## 2024-03-01 MED ORDER — ONDANSETRON HCL 4 MG/2ML IJ SOLN
4.0000 mg | Freq: Once | INTRAMUSCULAR | Status: AC
  Administered 2024-03-01: 4 mg via INTRAVENOUS
  Filled 2024-03-01: qty 2

## 2024-03-01 MED ORDER — KETOROLAC TROMETHAMINE 30 MG/ML IJ SOLN
30.0000 mg | Freq: Once | INTRAMUSCULAR | Status: AC
  Administered 2024-03-01: 30 mg via INTRAVENOUS
  Filled 2024-03-01: qty 1

## 2024-03-01 MED ORDER — SODIUM CHLORIDE 0.9 % IV BOLUS
500.0000 mL | Freq: Once | INTRAVENOUS | Status: AC
  Administered 2024-03-01: 500 mL via INTRAVENOUS

## 2024-03-01 MED ORDER — PANTOPRAZOLE SODIUM 40 MG IV SOLR
40.0000 mg | Freq: Once | INTRAVENOUS | Status: AC
  Administered 2024-03-01: 40 mg via INTRAVENOUS
  Filled 2024-03-01: qty 10

## 2024-03-01 MED ORDER — PANTOPRAZOLE SODIUM 20 MG PO TBEC: 20.0000 mg | DELAYED_RELEASE_TABLET | Freq: Every day | ORAL | 0 refills | Status: DC

## 2024-03-01 NOTE — Discharge Instructions (Addendum)
 Follow-up this week with your family doctor for recheck.  Your CT scan showed gallstones with multiple cysts on your liver along with a cyst on your ovary.  Your family doctor should know about the cyst and can follow you as an outpatient for this

## 2024-03-01 NOTE — ED Notes (Signed)
 Pt assisted to bathroom to give urine.

## 2024-03-01 NOTE — ED Triage Notes (Signed)
 Pt c/o central abdominal pain that radiates to right shoulder blade starting on Friday. Pt states she has a gallstone and saw General Surgery back in September but has not had any problems so did not have the surgery. Pt states pain started again Friday night and she has had chills and vomiting with this pain.

## 2024-03-03 NOTE — ED Provider Notes (Signed)
 [place] EMERGENCY DEPARTMENT AT [facility] Provider Note   CSN: [id number] Arrival date & time: 03/01/24  2595     History  Chief Complaint  Patient presents with   Abdominal Pain    Marcia Hahn is a 48 y.o. female.  Pt. Complains of abd pain.  No fever  The history is provided by the patient and medical records. No language interpreter was used.  Abdominal Pain Pain location:  Generalized Pain quality: aching   Pain radiates to:  Does not radiate Pain severity:  Mild Onset quality:  Sudden Timing:  Constant Progression:  Waxing and waning Chronicity:  Recurrent Context: not alcohol use   Associated symptoms: no chest pain, no cough, no diarrhea, no fatigue and no hematuria        Home Medications Prior to Admission medications   Medication Sig Start Date End Date Taking? Authorizing Provider  ondansetron  (ZOFRAN -ODT) 4 MG disintegrating tablet 4mg  ODT q4 hours prn nausea/vomit 03/01/24  Yes Consuello Lassalle, MD  pantoprazole  (PROTONIX ) 20 MG tablet Take 1 tablet (20 mg total) by mouth daily. 03/01/24  Yes Kale Rondeau, MD  dasatinib  (SPRYCEL ) 70 MG tablet TAKE 1 TABLET BY MOUTH ONCE DAILY AT THE SAME TIME. MAY TAKE WITH OR WITHOUT FOOD. SWALLOW WHOLE. AVOID GRAPEFRUIT PRODUCTS. 01/15/24   Paulett Boros, MD  diltiazem  (CARDIZEM  CD) 120 MG 24 hr capsule Take 1 capsule (120 mg total) by mouth daily. 11/11/23   Towanda Fret, MD  losartan  (COZAAR ) 100 MG tablet TAKE 1 TABLET DAILY 11/25/23   Towanda Fret, MD  Multiple Vitamins-Minerals (MULTIVITAMIN WITH MINERALS) tablet Take 1 tablet by mouth at bedtime.    [provider]  spironolactone  (ALDACTONE ) 100 MG tablet Take 1 tablet (100 mg total) by mouth daily. 11/11/23   Towanda Fret, MD  Vitamin D , Ergocalciferol , (DRISDOL ) 1.25 MG (50000 UNIT) CAPS capsule Take 1 capsule (50,000 Units total) by mouth every 7 (seven) days. 11/11/23   Towanda Fret, MD      Allergies     Amlodipine  and Tomato    Review of Systems   Review of Systems  Constitutional:  Negative for appetite change and fatigue.  HENT:  Negative for congestion, ear discharge and sinus pressure.   Eyes:  Negative for discharge.  Respiratory:  Negative for cough.   Cardiovascular:  Negative for chest pain.  Gastrointestinal:  Positive for abdominal pain. Negative for diarrhea.  Genitourinary:  Negative for frequency and hematuria.  Musculoskeletal:  Negative for back pain.  Skin:  Negative for rash.  Neurological:  Negative for seizures and headaches.  Psychiatric/Behavioral:  Negative for hallucinations.     Physical Exam Updated Vital Signs BP 123/73 (BP Location: Left Arm)   Pulse 77   Temp 98.8 F (37.1 C) (Oral)   Resp 20   Ht 5\' 6"  (1.676 m)   Wt 97.5 kg   LMP 02/13/2024   SpO2 100%   BMI 34.70 kg/m  Physical Exam Vitals and nursing note reviewed.  Constitutional:      Appearance: She is well-developed.  HENT:     Head: Normocephalic.     Nose: Nose normal.  Eyes:     General: No scleral icterus.    Conjunctiva/sclera: Conjunctivae normal.  Neck:     Thyroid : No thyromegaly.  Cardiovascular:     Rate and Rhythm: Normal rate and regular rhythm.     Heart sounds: No murmur heard.    No friction rub. No gallop.  Pulmonary:     Breath sounds: No stridor. No wheezing or rales.  Chest:     Chest wall: No tenderness.  Abdominal:     General: There is no distension.     Tenderness: There is abdominal tenderness. There is no rebound.  Musculoskeletal:        General: Normal range of motion.     Cervical back: Neck supple.  Lymphadenopathy:     Cervical: No cervical adenopathy.  Skin:    Findings: No erythema or rash.  Neurological:     Mental Status: She is alert and oriented to person, place, and time.     Motor: No abnormal muscle tone.     Coordination: Coordination normal.  Psychiatric:        Behavior: Behavior normal.     ED Results / Procedures /  Treatments   Labs (all labs ordered are listed, but only abnormal results are displayed) Labs Reviewed  COMPREHENSIVE METABOLIC PANEL WITH GFR - Abnormal; Notable for the following components:      Result Value   Potassium 2.9 (*)    Creatinine, Ser 1.10 (*)    Calcium 7.8 (*)    All other components within normal limits  CBC - Abnormal; Notable for the following components:   Platelets 64 (*)    All other components within normal limits  URINALYSIS, ROUTINE W REFLEX MICROSCOPIC - Abnormal; Notable for the following components:   Specific Gravity, Urine <1.005 (*)    Hgb urine dipstick MODERATE (*)    Protein, ur 30 (*)    All other components within normal limits  LIPASE, BLOOD  HCG, QUANTITATIVE, PREGNANCY  POC URINE PREG, ED    EKG None  Radiology CT ABDOMEN PELVIS W CONTRAST Result Date: 03/01/2024 CLINICAL DATA:  Abdominal pain extending to the right shoulder. EXAM: CT ABDOMEN AND PELVIS WITH CONTRAST TECHNIQUE: Multidetector CT imaging of the abdomen and pelvis was performed using the standard protocol following bolus administration of intravenous contrast. RADIATION DOSE REDUCTION: This exam was performed according to the departmental dose-optimization program which includes automated exposure control, adjustment of the mA and/or kV according to patient size and/or use of iterative reconstruction technique. CONTRAST:  OMNIPAQUE  IOHEXOL  300 MG/ML  SOLN COMPARISON:  Right upper quadrant ultrasound 06/03/2023. FINDINGS: Lower chest: The lung bases are clear without focal nodule, mass, or airspace disease. The heart size is normal. No significant pleural or pericardial effusion is present. Hepatobiliary: Multiple Paddock cysts are present. The largest is in the right lobe measuring 11.2 x 8.9 cm. No solid lesions are present. The common bile duct and gallbladder are within normal limits. Pancreas: Unremarkable. No pancreatic ductal dilatation or surrounding inflammatory changes.  Spleen: Normal in size without focal abnormality. Adrenals/Urinary Tract: Adrenal glands are normal bilaterally. Bilateral simple cysts are present, measuring up to 20 mm on the left and 15 mm on the right. Stomach/Bowel: Stomach and duodenum are within normal limits. Small bowel is unremarkable. Terminal ileum is normal. The appendix is visualized and normal. The ascending and transverse colon are within normal limits. The descending and sigmoid colon are normal. Vascular/Lymphatic: No significant vascular findings are present. No enlarged abdominal or pelvic lymph nodes. Reproductive: A hemorrhagic cyst in the right adnexa measures 2.8 cm. The uterus and adnexa are otherwise within normal limits. Other: No abdominal wall hernia or abnormality. No abdominopelvic ascites. Musculoskeletal: Vertebral body heights and alignment are normal. Asymmetric facet degenerative changes contribute to moderate left foraminal stenosis at L5-S1. The bony  pelvis is normal. The hips are located and within normal limits. IMPRESSION: 1. No acute or focal lesion to explain the patient's symptoms. 2. Multiple hepatic cysts. The largest is in the right lobe measuring 11.2 x 8.9 cm. 3. Bilateral simple adrenal cysts. 4. Hemorrhagic cyst in the right adnexa measures 2.8 cm. 5. Asymmetric facet degenerative changes contribute to moderate left foraminal stenosis at L5-S1. Electronically Signed   By: Audree Leas M.D.   On: 03/01/2024 12:10   US  Abdomen Limited RUQ (LIVER/GB) Result Date: 03/01/2024 CLINICAL DATA:  Right upper quadrant pain for 2 days. History of gallstones. EXAM: ULTRASOUND ABDOMEN LIMITED RIGHT UPPER QUADRANT COMPARISON:  None Available. FINDINGS: Gallbladder: The gallbladder wall thickness is upper limits of normal measuring 2.9 mm. No pericholecystic fluid or sonographic Murphy's sign. There is a stone within the gallbladder which measures 2.6 cm. Common bile duct: Diameter: 2.0 mm Liver: Normal parenchymal  echogenicity. Scattered liver cysts are identified. The largest cyst is in the dome of liver measuring 12.5 x 10.5 x 11.8 cm. Portal vein is patent on color Doppler imaging with normal direction of blood flow towards the liver. Other: None. IMPRESSION: 1. Cholelithiasis without secondary signs of acute cholecystitis. 2. Scattered liver cysts. The largest cyst is in the dome of liver measuring 12.5 x 10.5 x 11.8 cm. Electronically Signed   By: Kimberley Penman M.D.   On: 03/01/2024 11:34    Procedures Procedures    Medications Ordered in ED Medications  sodium chloride  0.9 % bolus 500 mL (0 mLs Intravenous Stopped 03/01/24 1410)  ondansetron  (ZOFRAN ) injection 4 mg (4 mg Intravenous Given 03/01/24 1033)  pantoprazole  (PROTONIX ) injection 40 mg (40 mg Intravenous Given 03/01/24 1033)  ketorolac  (TORADOL ) 30 MG/ML injection 30 mg (30 mg Intravenous Given 03/01/24 1033)  iohexol  (OMNIPAQUE ) 300 MG/ML solution 100 mL (100 mLs Intravenous Contrast Given 03/01/24 1142)    ED Course/ Medical Decision Making/ A&P                                 Medical Decision Making Amount and/or Complexity of Data Reviewed Labs: ordered. Radiology: ordered.  Risk Prescription drug management.   Pt with gall stones no cholecystitis and cysts on liver and ovary.  She is to follow up with pcp        Final Clinical Impression(s) / ED Diagnoses Final diagnoses:  Pain of upper abdomen    Rx / DC Orders ED Discharge Orders          Ordered    pantoprazole  (PROTONIX ) 20 MG tablet  Daily        03/01/24 1340    ondansetron  (ZOFRAN -ODT) 4 MG disintegrating tablet        03/01/24 1340              Cheyenne Cotta, MD 03/03/24 1054

## 2024-03-20 ENCOUNTER — Telehealth: Payer: Self-pay

## 2024-03-20 ENCOUNTER — Encounter (HOSPITAL_COMMUNITY): Payer: Self-pay

## 2024-03-20 DIAGNOSIS — C9210 Chronic myeloid leukemia, BCR/ABL-positive, not having achieved remission: Secondary | ICD-10-CM | POA: Diagnosis not present

## 2024-03-20 DIAGNOSIS — I10 Essential (primary) hypertension: Secondary | ICD-10-CM | POA: Diagnosis not present

## 2024-03-20 DIAGNOSIS — E785 Hyperlipidemia, unspecified: Secondary | ICD-10-CM | POA: Diagnosis not present

## 2024-03-20 DIAGNOSIS — R7303 Prediabetes: Secondary | ICD-10-CM | POA: Diagnosis not present

## 2024-03-20 NOTE — Telephone Encounter (Signed)
 Copied from CRM [id number]. Topic: Appointments - Appointment Info/Confirmation >> Mar 20, 2024  8:54 AM Marcia Hahn wrote: Patient/patient representative is calling for information regarding an appointment.   Patient would like to know if she is needing labs for may 16 appointment, please call to advise.

## 2024-03-20 NOTE — Telephone Encounter (Signed)
 Pt informed that Dr Rodolph Clap wanted fasting labs per last ov note

## 2024-03-21 LAB — LIPID PANEL
Chol/HDL Ratio: 4.1 ratio (ref 0.0–4.4)
Cholesterol, Total: 185 mg/dL (ref 100–199)
HDL: 45 mg/dL (ref >39)
LDL Chol Calc (NIH): 115 mg/dL — ABNORMAL HIGH (ref 0–99)
Triglycerides: 140 mg/dL (ref 0–149)
VLDL Cholesterol Cal: 25 mg/dL (ref 5–40)

## 2024-03-21 LAB — HEMOGLOBIN A1C
Est. average glucose Bld gHb Est-mCnc: 120 mg/dL
Hgb A1c MFr Bld: 5.8 % — ABNORMAL HIGH (ref 4.8–5.6)

## 2024-03-21 LAB — CMP14+EGFR
ALT: 31 IU/L (ref 0–32)
AST: 28 IU/L (ref 0–40)
Albumin: 4.2 g/dL (ref 3.9–4.9)
Alkaline Phosphatase: 70 IU/L (ref 44–121)
BUN/Creatinine Ratio: 14 (ref 9–23)
BUN: 14 mg/dL (ref 6–24)
Bilirubin Total: 0.3 mg/dL (ref 0.0–1.2)
CO2: 21 mmol/L (ref 20–29)
Calcium: 8.9 mg/dL (ref 8.7–10.2)
Chloride: 104 mmol/L (ref 96–106)
Creatinine, Ser: 1 mg/dL (ref 0.57–1.00)
Globulin, Total: 2.4 g/dL (ref 1.5–4.5)
Glucose: 81 mg/dL (ref 70–99)
Potassium: 4.3 mmol/L (ref 3.5–5.2)
Sodium: 139 mmol/L (ref 134–144)
Total Protein: 6.6 g/dL (ref 6.0–8.5)
eGFR: 69 mL/min/{1.73_m2} (ref >59)

## 2024-03-27 ENCOUNTER — Encounter: Payer: Self-pay | Admitting: Family Medicine

## 2024-03-27 ENCOUNTER — Ambulatory Visit: Admitting: Family Medicine

## 2024-03-27 VITALS — BP 130/82 | HR 77 | Resp 18 | Ht 66.0 in | Wt 216.0 lb

## 2024-03-27 DIAGNOSIS — I10 Essential (primary) hypertension: Secondary | ICD-10-CM | POA: Diagnosis not present

## 2024-03-27 DIAGNOSIS — E785 Hyperlipidemia, unspecified: Secondary | ICD-10-CM

## 2024-03-27 DIAGNOSIS — E559 Vitamin D deficiency, unspecified: Secondary | ICD-10-CM | POA: Diagnosis not present

## 2024-03-27 DIAGNOSIS — R7303 Prediabetes: Secondary | ICD-10-CM | POA: Diagnosis not present

## 2024-03-27 DIAGNOSIS — C9210 Chronic myeloid leukemia, BCR/ABL-positive, not having achieved remission: Secondary | ICD-10-CM

## 2024-03-27 NOTE — Patient Instructions (Signed)
 Annual exam in 6 months , call if you need me sooner  Congrats on improved health habits and health, keep it up!  Fasting lipid, cmp and eGFR, hBA1C, TSH and vit D 5  to 7 days before next appt  Thanks for choosing [facility], we consider it a privelige to serve you.

## 2024-03-29 ENCOUNTER — Encounter: Payer: Self-pay | Admitting: Family Medicine

## 2024-03-29 NOTE — Assessment & Plan Note (Addendum)
 unchanged  Patient educated about the importance of limiting  Carbohydrate intake , the need to commit to daily physical activity for a minimum of 30 minutes , and to commit weight loss. The fact that changes in all these areas will reduce or eliminate all together the development of diabetes is stressed.      Latest Ref Rng & Units 03/20/2024   12:21 PM 03/01/2024   10:38 AM 08/20/2023    1:30 PM 07/17/2023   11:12 AM 02/19/2023   12:02 PM  Diabetic Labs  HbA1c 4.8 - 5.6 % 5.8    5.8  5.7   Chol 100 - 199 mg/dL 782   956  213  086   HDL >39 mg/dL 45    39  38   Calc LDL 0 - 99 mg/dL 578    469  629   Triglycerides 0 - 149 mg/dL 528   413  244  010   Creatinine 0.57 - 1.00 mg/dL 2.72  5.36  6.44  0.34  1.15       03/27/2024    2:01 PM 03/01/2024    1:00 PM 03/01/2024   12:45 PM 03/01/2024   12:30 PM 03/01/2024   10:00 AM 03/01/2024    9:52 AM 12/02/2023   11:59 AM  BP/Weight  Systolic BP 130 123 133 122 147  148  Diastolic BP 82 73 72 73 98  91  Wt. (Lbs) 216.04     215 224.6  BMI 34.87 kg/m2     34.7 kg/m2 36.25 kg/m2      Latest Ref Rng & Units 12/23/2019   12:00 AM  Foot/eye exam completion dates  Eye Exam No Retinopathy No Retinopathy         This result is from an external source.    Updated lab needed at/ before next visit.

## 2024-03-29 NOTE — Assessment & Plan Note (Signed)
Managed by oncology  stable

## 2024-03-29 NOTE — Assessment & Plan Note (Signed)
 Hyperlipidemia:Low fat diet discussed and encouraged.   Lipid Panel  Lab Results  Component Value Date   CHOL 185 03/20/2024   HDL 45 03/20/2024   LDLCALC 115 (H) 03/20/2024   TRIG 140 03/20/2024   CHOLHDL 4.1 03/20/2024     Needs to reduce fat in diet, improved

## 2024-03-29 NOTE — Assessment & Plan Note (Signed)
 DASH diet and commitment to daily physical activity for a minimum of 30 minutes discussed and encouraged, as a part of hypertension management. The importance of attaining a healthy weight is also discussed.     03/27/2024    2:01 PM 03/01/2024    1:00 PM 03/01/2024   12:45 PM 03/01/2024   12:30 PM 03/01/2024   10:00 AM 03/01/2024    9:52 AM 12/02/2023   11:59 AM  BP/Weight  Systolic BP 130 123 133 122 147  148  Diastolic BP 82 73 72 73 98  91  Wt. (Lbs) 216.04     215 224.6  BMI 34.87 kg/m2     34.7 kg/m2 36.25 kg/m2      Controlled, no change in medication

## 2024-03-29 NOTE — Progress Notes (Signed)
 Marcia Hahn     MRN: [id number]      DOB: [birth year]  Chief Complaint  Patient presents with   Hypertension    Follow up     HPI [name] is here for follow up and re-evaluation of chronic medical conditions, medication management and review of any available recent lab and radiology data.  Preventive health is updated, specifically  Cancer screening and Immunization.   Questions or concerns regarding consultations or procedures which the PT has had in the interim are  addressed. The PT denies any adverse reactions to current medications since the last visit.  There are no new concerns.  There are no specific complaints   ROS Denies recent fever or chills. Denies sinus pressure, nasal congestion, ear pain or sore throat. Denies chest congestion, productive cough or wheezing. Denies chest pains, palpitations and leg swelling Denies abdominal pain, nausea, vomiting,diarrhea or constipation.   Denies dysuria, frequency, hesitancy or incontinence. Denies joint pain, swelling and limitation in mobility. Denies headaches, seizures, numbness, or tingling. Denies depression, anxiety or insomnia. Denies skin break down or rash.   PE  BP 130/82   Pulse 77   Resp 18   Ht 5\' 6"  (1.676 m)   Wt 216 lb 0.6 oz (98 kg)   LMP 02/13/2024   SpO2 96%   BMI 34.87 kg/m   Patient alert and oriented and in no cardiopulmonary distress.  HEENT: No facial asymmetry, EOMI,     Neck supple .  Chest: Clear to auscultation bilaterally.  CVS: S1, S2 no murmurs, no S3.Regular rate.  ABD: Soft non tender.   Ext: No edema  MS: Adequate ROM spine, shoulders, hips and knees.  Skin: Intact, no ulcerations or rash noted.  Psych: Good eye contact, normal affect. Memory intact not anxious or depressed appearing.  CNS: CN 2-12 intact, power,  normal throughout.no focal deficits noted.   Assessment & Plan  Prediabetes unchanged  Patient educated about the importance of limiting   Carbohydrate intake , the need to commit to daily physical activity for a minimum of 30 minutes , and to commit weight loss. The fact that changes in all these areas will reduce or eliminate all together the development of diabetes is stressed.      Latest Ref Rng & Units 03/20/2024   12:21 PM 03/01/2024   10:38 AM 08/20/2023    1:30 PM 07/17/2023   11:12 AM 02/19/2023   12:02 PM  Diabetic Labs  HbA1c 4.8 - 5.6 % 5.8    5.8  5.7   Chol 100 - 199 mg/dL 409   811  914  782   HDL >39 mg/dL 45    39  38   Calc LDL 0 - 99 mg/dL 956    213  086   Triglycerides 0 - 149 mg/dL 578   469  629  528   Creatinine 0.57 - 1.00 mg/dL 4.13  2.44  0.10  2.72  1.15       03/27/2024    2:01 PM 03/01/2024    1:00 PM 03/01/2024   12:45 PM 03/01/2024   12:30 PM 03/01/2024   10:00 AM 03/01/2024    9:52 AM 12/02/2023   11:59 AM  BP/Weight  Systolic BP 130 123 133 122 147  148  Diastolic BP 82 73 72 73 98  91  Wt. (Lbs) 216.04     215 224.6  BMI 34.87 kg/m2     34.7 kg/m2 36.25 kg/m2  Latest Ref Rng & Units 12/23/2019   12:00 AM  Foot/eye exam completion dates  Eye Exam No Retinopathy No Retinopathy         This result is from an external source.    Updated lab needed at/ before next visit.   Vitamin D  deficiency Updated lab needed at/ before next visit.   Malignant hypertension DASH diet and commitment to daily physical activity for a minimum of 30 minutes discussed and encouraged, as a part of hypertension management. The importance of attaining a healthy weight is also discussed.     03/27/2024    2:01 PM 03/01/2024    1:00 PM 03/01/2024   12:45 PM 03/01/2024   12:30 PM 03/01/2024   10:00 AM 03/01/2024    9:52 AM 12/02/2023   11:59 AM  BP/Weight  Systolic BP 130 123 133 122 147  148  Diastolic BP 82 73 72 73 98  91  Wt. (Lbs) 216.04     215 224.6  BMI 34.87 kg/m2     34.7 kg/m2 36.25 kg/m2      Controlled, no change in medication   Morbid obesity (HCC)  Patient re-educated about  the  importance of commitment to a  minimum of 150 minutes of exercise per week as able.  The importance of healthy food choices with portion control discussed, as well as eating regularly and within a 12 hour window most days. The need to choose "clean , green" food 50 to 75% of the time is discussed, as well as to make water the primary drink and set a goal of 64 ounces water daily.       03/27/2024    2:01 PM 03/01/2024    9:52 AM 12/02/2023   11:59 AM  Weight /BMI  Weight 216 lb 0.6 oz 215 lb 224 lb 9.6 oz  Height 5\' 6"  (1.676 m) 5\' 6"  (1.676 m) 5\' 6"  (1.676 m)  BMI 34.87 kg/m2 34.7 kg/m2 36.25 kg/m2      Dyslipidemia (high LDL; low HDL) Hyperlipidemia:Low fat diet discussed and encouraged.   Lipid Panel  Lab Results  Component Value Date   CHOL 185 03/20/2024   HDL 45 03/20/2024   LDLCALC 115 (H) 03/20/2024   TRIG 140 03/20/2024   CHOLHDL 4.1 03/20/2024     Needs to reduce fat in diet, improved  CML (chronic myelocytic leukemia) (HCC) Managed by oncology stable

## 2024-03-29 NOTE — Assessment & Plan Note (Signed)
  Patient re-educated about  the importance of commitment to a  minimum of 150 minutes of exercise per week as able.  The importance of healthy food choices with portion control discussed, as well as eating regularly and within a 12 hour window most days. The need to choose "clean , green" food 50 to 75% of the time is discussed, as well as to make water the primary drink and set a goal of 64 ounces water daily.       03/27/2024    2:01 PM 03/01/2024    9:52 AM 12/02/2023   11:59 AM  Weight /BMI  Weight 216 lb 0.6 oz 215 lb 224 lb 9.6 oz  Height 5\' 6"  (1.676 m) 5\' 6"  (1.676 m) 5\' 6"  (1.676 m)  BMI 34.87 kg/m2 34.7 kg/m2 36.25 kg/m2

## 2024-03-29 NOTE — Assessment & Plan Note (Signed)
 Updated lab needed at/ before next visit.

## 2024-03-30 ENCOUNTER — Inpatient Hospital Stay: Payer: BC Managed Care – PPO | Attending: Hematology | Admitting: Hematology

## 2024-03-30 VITALS — BP 140/87 | HR 80 | Temp 98.0°F | Resp 16 | Ht 66.0 in | Wt 211.0 lb

## 2024-03-30 DIAGNOSIS — R011 Cardiac murmur, unspecified: Secondary | ICD-10-CM | POA: Insufficient documentation

## 2024-03-30 DIAGNOSIS — R161 Splenomegaly, not elsewhere classified: Secondary | ICD-10-CM | POA: Diagnosis not present

## 2024-03-30 DIAGNOSIS — I10 Essential (primary) hypertension: Secondary | ICD-10-CM | POA: Diagnosis not present

## 2024-03-30 DIAGNOSIS — D649 Anemia, unspecified: Secondary | ICD-10-CM | POA: Diagnosis not present

## 2024-03-30 DIAGNOSIS — Z87891 Personal history of nicotine dependence: Secondary | ICD-10-CM | POA: Insufficient documentation

## 2024-03-30 DIAGNOSIS — E669 Obesity, unspecified: Secondary | ICD-10-CM | POA: Insufficient documentation

## 2024-03-30 DIAGNOSIS — Z803 Family history of malignant neoplasm of breast: Secondary | ICD-10-CM | POA: Insufficient documentation

## 2024-03-30 DIAGNOSIS — M47817 Spondylosis without myelopathy or radiculopathy, lumbosacral region: Secondary | ICD-10-CM | POA: Diagnosis not present

## 2024-03-30 DIAGNOSIS — G4733 Obstructive sleep apnea (adult) (pediatric): Secondary | ICD-10-CM | POA: Diagnosis not present

## 2024-03-30 DIAGNOSIS — Z79899 Other long term (current) drug therapy: Secondary | ICD-10-CM | POA: Insufficient documentation

## 2024-03-30 DIAGNOSIS — K7689 Other specified diseases of liver: Secondary | ICD-10-CM | POA: Insufficient documentation

## 2024-03-30 DIAGNOSIS — C9210 Chronic myeloid leukemia, BCR/ABL-positive, not having achieved remission: Secondary | ICD-10-CM | POA: Diagnosis not present

## 2024-03-30 NOTE — Progress Notes (Signed)
 [facility] [address] [place] [address]    Clinic Day:  03/30/24   Referring physician: Towanda Fret, MD  Patient Care Team: Towanda Fret, MD as PCP - General Paulett Boros, MD as Consulting Physician (Hematology) Vinetta Greening, DO as Consulting Physician (Internal Medicine)   ASSESSMENT & PLAN:   Assessment: 1.  CML in chronic phase: -Presentation with hyperleukocytosis.  BCR/ABL by FISH positive. -CT chest PE protocol showed splenomegaly measuring 14 cm. -BMBX on 02/23/2020 shows hypercellular marrow with myeloid hyperplasia consistent with CML.  No increase in blasts.  Flow cytometry less than 1% blasts.  Chromosome analysis shows Philadelphia chromosome. -Dasatinib  70 mg daily started on 04/01/2020. -BCR/ABL by quantitative PCR on 06/30/2020 shows B3 A2 transcript improved to 2.19 from 128 previously.  B2 A2 and E1 A2 transcripts are undetectable. -BCR/ABL by quantitative PCR on 11/21/2020 -. - BCR/ABL from 03/22/2021 was negative. - BCR/ABL on 06/27/2021 was undetectable. - BCR/ABL on 01/31/2022 ([facility])-0.037% - BCR/ABL (P210 fusion transcript) on 06/19/2022-0.004% - [facility] BCR/ABL PCR on 11/02/2022: P210 transcript at 1.532%. - BCR/ABL by PCR (02/19/2023) negative - BCR/ABL by PCR (05/23/2023): Negative - BCR/ABL by PCR (11/04/2023): Negative - BCR/ABL by PCR (03/20/2024): Negative    Plan: 1.  CML in chronic phase: - She is tolerating Dasatinib  very well.  No infections reported in the past 4 months. - Physical exam: No evidence of fluid retention. - Labs reviewed from 03/20/2024: BCR/ABL by PCR undetectable.  CBC shows white count 4.8, hemoglobin 11.4 and platelet count 297.  LFTs are within normal limits. - Continue Dasatinib  70 mg daily. - We discussed the option of treatment free interval if her PCR is undetectable after 2+ years.  RTC 6 months for follow-up with repeat BCR/ABL by PCR.  If it is negative, we we will  discuss option of stopping Dasatinib  and close monitoring of BCR/ABL by PCR.   2.  Hypertension: - Continue spironolactone , losartan  and Cardizem .  Blood pressure is 140/87.   3.  Normocytic anemia: - Hemoglobin is remaining in the normal range at 11.4.  Orders Placed This Encounter  Procedures   CBC with Differential    Standing Status:   Future    Expected Date:   09/28/2024    Expiration Date:   03/30/2025   Comprehensive metabolic panel    Standing Status:   Future    Expected Date:   09/28/2024    Expiration Date:   03/30/2025   Lactate dehydrogenase    Standing Status:   Future    Expected Date:   09/28/2024    Expiration Date:   03/30/2025   BCR-ABL1, CML/ALL, PCR, QUANT    Standing Status:   Future    Expected Date:   09/28/2024    Expiration Date:   03/30/2025      Hurman Maiden R Teague,acting as a scribe for Paulett Boros, MD.,have documented all relevant documentation on the behalf of Paulett Boros, MD,as directed by  Paulett Boros, MD while in the presence of Paulett Boros, MD.  I, Paulett Boros MD, have reviewed the above documentation for accuracy and completeness, and I agree with the above.     Paulett Boros, MD   5/19/202512:35 PM  CHIEF COMPLAINT:   Diagnosis: CML    Cancer Staging  No matching staging information was found for the patient.    Prior Therapy: none  Current Therapy:  Dasatinib  70 mg daily    HISTORY OF PRESENT ILLNESS:  Oncology History   No history exists.     INTERVAL HISTORY:   Lively is a 48 y.o. female seen for follow-up of CML. She was last seen by me on 12/02/23.  Today, she states that she is doing well overall. Her appetite level is at 100%. Her energy level is at 100%.   PAST MEDICAL HISTORY:   Past Medical History: Past Medical History:  Diagnosis Date   Abnormal mammogram of right breast 09/03/2018   Acne vulgaris    CML (chronic myelocytic leukemia) (HCC)    Essential  hypertension 02/27/2008   Qualifier: Diagnosis of   By: Lorita Rosa LPN, Jaime       Hypertension    Mild obesity    Murmur 03/07/2023   OSA (obstructive sleep apnea) 03/07/2023    Surgical History: Past Surgical History:  Procedure Laterality Date   BREAST BIOPSY Left    CESAREAN SECTION     COLONOSCOPY WITH PROPOFOL  N/A 03/17/2021   Procedure: COLONOSCOPY WITH PROPOFOL ;  Surgeon: Vinetta Greening, DO;  Location: [facility];  Service: Endoscopy;  Laterality: N/A;  ASA II / AM procedure   DILATATION AND CURETTAGE/HYSTEROSCOPY WITH MINERVA N/A 10/26/2020   Procedure: DILATATION AND CURETTAGE/HYSTEROSCOPY WITH MINERVA ENDOMETRIAL ABLATION;  Surgeon: Wendelyn Halter, MD;  Location: [facility];  Service: Gynecology;  Laterality: N/A;   LAPAROSCOPIC BILATERAL SALPINGECTOMY Bilateral 10/26/2020   Procedure: LAPAROSCOPIC BILATERAL SALPINGECTOMY;  Surgeon: Wendelyn Halter, MD;  Location: [facility];  Service: Gynecology;  Laterality: Bilateral;   POLYPECTOMY  03/17/2021   Procedure: POLYPECTOMY;  Surgeon: Vinetta Greening, DO;  Location: [facility];  Service: Endoscopy;;    Social History: Social History   Socioeconomic History   Marital status: Married    Spouse name: Not on file   Number of children: 2   Years of education: Not on file   Highest education level: Bachelor's degree (e.g., BA, AB, BS)  Occupational History   Occupation: Set designer   Tobacco Use   Smoking status: Former    Current packs/day: 0.00    Types: Cigarettes    Quit date: 12/04/2012    Years since quitting: 11.3   Smokeless tobacco: Never  Vaping Use   Vaping status: Never Used  Substance and Sexual Activity   Alcohol use: Yes    Comment: occasional   Drug use: No   Sexual activity: Not Currently    Birth control/protection: Surgical    Comment: tubal and ablation  Other Topics Concern   Not on file  Social History Narrative   Mother of twins    Social Drivers of Health   Financial Resource Strain: Low  Risk  (03/23/2024)   Overall Financial Resource Strain (CARDIA)    Difficulty of Paying Living Expenses: Not hard at all  Food Insecurity: No Food Insecurity (03/23/2024)   Hunger Vital Sign    Worried About Running Out of Food in the Last Year: Never true    Ran Out of Food in the Last Year: Never true  Transportation Needs: No Transportation Needs (03/23/2024)   PRAPARE - Administrator, Civil Service (Medical): No    Lack of Transportation (Non-Medical): No  Physical Activity: Insufficiently Active (03/23/2024)   Exercise Vital Sign    Days of Exercise per Week: 2 days    Minutes of Exercise per Session: 30 min  Stress: No Stress Concern Present (03/23/2024)   [employer] of Occupational Health - Occupational Stress Questionnaire    Feeling of Stress :  Not at all  Social Connections: Socially Integrated (03/23/2024)   Social Connection and Isolation Panel [NHANES]    Frequency of Communication with Friends and Family: More than three times a week    Frequency of Social Gatherings with Friends and Family: Three times a week    Attends Religious Services: More than 4 times per year    Active Member of Clubs or Organizations: Yes    Attends Banker Meetings: More than 4 times per year    Marital Status: Married  Catering manager Violence: Not At Risk (08/25/2020)   Humiliation, Afraid, Rape, and Kick questionnaire    Fear of Current or Ex-Partner: No    Emotionally Abused: No    Physically Abused: No    Sexually Abused: No    Family History: Family History  Problem Relation Age of Onset   GER disease Mother    Diabetes Mother    Hypertension Mother    Heart attack Father 21   Heart disease Father    Migraines Sister    Breast cancer Maternal Grandmother    Stroke Maternal Grandmother    Breast cancer Cousin    Stroke Maternal Aunt    Colon cancer Neg Hx     Current Medications:  Current Outpatient Medications:    dasatinib  (SPRYCEL ) 70 MG  tablet, TAKE 1 TABLET BY MOUTH ONCE DAILY AT THE SAME TIME. MAY TAKE WITH OR WITHOUT FOOD. SWALLOW WHOLE. AVOID GRAPEFRUIT PRODUCTS., Disp: 30 tablet, Rfl: 3   diltiazem  (CARDIZEM  CD) 120 MG 24 hr capsule, Take 1 capsule (120 mg total) by mouth daily., Disp: 90 capsule, Rfl: 1   losartan  (COZAAR ) 100 MG tablet, TAKE 1 TABLET DAILY, Disp: 90 tablet, Rfl: 3   Multiple Vitamins-Minerals (MULTIVITAMIN WITH MINERALS) tablet, Take 1 tablet by mouth at bedtime., Disp: , Rfl:    spironolactone  (ALDACTONE ) 100 MG tablet, Take 1 tablet (100 mg total) by mouth daily., Disp: 90 tablet, Rfl: 1   Vitamin D , Ergocalciferol , (DRISDOL ) 1.25 MG (50000 UNIT) CAPS capsule, Take 1 capsule (50,000 Units total) by mouth every 7 (seven) days., Disp: 12 capsule, Rfl: 2   Allergies: Allergies  Allergen Reactions   Amlodipine      Headache    Tomato Itching and Swelling    Processed tomatoes in the can.     REVIEW OF SYSTEMS:   Review of Systems  Constitutional:  Negative for chills, fatigue and fever.  HENT:   Negative for lump/mass, mouth sores, nosebleeds, sore throat and trouble swallowing.   Eyes:  Negative for eye problems.  Respiratory:  Negative for cough and shortness of breath.   Cardiovascular:  Negative for chest pain, leg swelling and palpitations.  Gastrointestinal:  Negative for abdominal pain, constipation, diarrhea, nausea and vomiting.  Genitourinary:  Negative for bladder incontinence, difficulty urinating, dysuria, frequency, hematuria and nocturia.   Musculoskeletal:  Negative for arthralgias, back pain, flank pain, myalgias and neck pain.  Skin:  Negative for itching and rash.  Neurological:  Negative for dizziness, headaches and numbness.  Hematological:  Does not bruise/bleed easily.  Psychiatric/Behavioral:  Negative for depression, sleep disturbance and suicidal ideas. The patient is not nervous/anxious.   All other systems reviewed and are negative.    VITALS:   Blood pressure (!)  140/87, pulse 80, temperature 98 F (36.7 C), temperature source Oral, resp. rate 16, height 5\' 6"  (1.676 m), weight 210 lb 15.7 oz (95.7 kg), last menstrual period 03/30/2024, SpO2 100%.  Wt Readings from Last 3 Encounters:  03/30/24 210 lb 15.7 oz (95.7 kg)  03/27/24 216 lb 0.6 oz (98 kg)  03/01/24 215 lb (97.5 kg)    Body mass index is 34.05 kg/m.  Performance status (ECOG): 0 - Asymptomatic   PHYSICAL EXAM:   Physical Exam Vitals and nursing note reviewed. Exam conducted with a chaperone present.  Constitutional:      Appearance: Normal appearance.  Cardiovascular:     Rate and Rhythm: Normal rate and regular rhythm.     Pulses: Normal pulses.     Heart sounds: Normal heart sounds.  Pulmonary:     Effort: Pulmonary effort is normal.     Breath sounds: Normal breath sounds.  Abdominal:     Palpations: Abdomen is soft. There is no hepatomegaly, splenomegaly or mass.     Tenderness: There is no abdominal tenderness.  Musculoskeletal:     Right lower leg: No edema.     Left lower leg: No edema.  Lymphadenopathy:     Cervical: No cervical adenopathy.     Right cervical: No superficial, deep or posterior cervical adenopathy.    Left cervical: No superficial, deep or posterior cervical adenopathy.     Upper Body:     Right upper body: No supraclavicular or axillary adenopathy.     Left upper body: No supraclavicular or axillary adenopathy.  Neurological:     General: No focal deficit present.     Mental Status: She is alert and oriented to person, place, and time.  Psychiatric:        Mood and Affect: Mood normal.        Behavior: Behavior normal.     LABS:      Latest Ref Rng & Units 03/01/2024   10:38 AM 08/20/2023    1:30 PM 10/09/2021    2:48 PM  CBC  WBC 4.0 - 10.5 K/uL 6.6  6.9  6.8   Hemoglobin 12.0 - 15.0 g/dL 40.9  81.1  91.4   Hematocrit 36.0 - 46.0 % 44.1  33.0  35.7   Platelets 150 - 400 K/uL 64  321  261       Latest Ref Rng & Units 03/20/2024    12:21 PM 03/01/2024   10:38 AM 08/20/2023    1:30 PM  CMP  Glucose 70 - 99 mg/dL 81  97  82   BUN 6 - 24 mg/dL 14  16  12    Creatinine 0.57 - 1.00 mg/dL 7.82  9.56  2.13   Sodium 134 - 144 mmol/L 139  137  137   Potassium 3.5 - 5.2 mmol/L 4.3  2.9  4.1   Chloride 96 - 106 mmol/L 104  103  103   CO2 20 - 29 mmol/L 21  24  21    Calcium 8.7 - 10.2 mg/dL 8.9  7.8  8.8   Total Protein 6.0 - 8.5 g/dL 6.6  6.8  6.4   Total Bilirubin 0.0 - 1.2 mg/dL 0.3  0.3  <0.8   Alkaline Phos 44 - 121 IU/L 70  74  64   AST 0 - 40 IU/L 28  26  27    ALT 0 - 32 IU/L 31  34  26      No results found for: "CEA1", "CEA" / No results found for: "CEA1", "CEA" No results found for: "PSA1" No results found for: "MVH846" No results found for: "CAN125"  No results found for: "TOTALPROTELP", "ALBUMINELP", "A1GS", "A2GS", "BETS", "BETA2SER", "GAMS", "MSPIKE", "SPEI" Lab Results  Component Value  Date   TIBC 328 08/20/2023   TIBC 427 06/30/2020   TIBC 365 04/28/2020   FERRITIN 41 08/20/2023   FERRITIN 20 06/30/2020   FERRITIN 20 04/28/2020   IRONPCTSAT 19 08/20/2023   IRONPCTSAT 13 06/30/2020   IRONPCTSAT 13 04/28/2020   Lab Results  Component Value Date   LDH 159 10/09/2021   LDH 154 06/27/2021   LDH 144 03/22/2021     STUDIES:   CT ABDOMEN PELVIS W CONTRAST Result Date: 03/01/2024 CLINICAL DATA:  Abdominal pain extending to the right shoulder. EXAM: CT ABDOMEN AND PELVIS WITH CONTRAST TECHNIQUE: Multidetector CT imaging of the abdomen and pelvis was performed using the standard protocol following bolus administration of intravenous contrast. RADIATION DOSE REDUCTION: This exam was performed according to the departmental dose-optimization program which includes automated exposure control, adjustment of the mA and/or kV according to patient size and/or use of iterative reconstruction technique. CONTRAST:  OMNIPAQUE  IOHEXOL  300 MG/ML  SOLN COMPARISON:  Right upper quadrant ultrasound 06/03/2023.  FINDINGS: Lower chest: The lung bases are clear without focal nodule, mass, or airspace disease. The heart size is normal. No significant pleural or pericardial effusion is present. Hepatobiliary: Multiple Paddock cysts are present. The largest is in the right lobe measuring 11.2 x 8.9 cm. No solid lesions are present. The common bile duct and gallbladder are within normal limits. Pancreas: Unremarkable. No pancreatic ductal dilatation or surrounding inflammatory changes. Spleen: Normal in size without focal abnormality. Adrenals/Urinary Tract: Adrenal glands are normal bilaterally. Bilateral simple cysts are present, measuring up to 20 mm on the left and 15 mm on the right. Stomach/Bowel: Stomach and duodenum are within normal limits. Small bowel is unremarkable. Terminal ileum is normal. The appendix is visualized and normal. The ascending and transverse colon are within normal limits. The descending and sigmoid colon are normal. Vascular/Lymphatic: No significant vascular findings are present. No enlarged abdominal or pelvic lymph nodes. Reproductive: A hemorrhagic cyst in the right adnexa measures 2.8 cm. The uterus and adnexa are otherwise within normal limits. Other: No abdominal wall hernia or abnormality. No abdominopelvic ascites. Musculoskeletal: Vertebral body heights and alignment are normal. Asymmetric facet degenerative changes contribute to moderate left foraminal stenosis at L5-S1. The bony pelvis is normal. The hips are located and within normal limits. IMPRESSION: 1. No acute or focal lesion to explain the patient's symptoms. 2. Multiple hepatic cysts. The largest is in the right lobe measuring 11.2 x 8.9 cm. 3. Bilateral simple adrenal cysts. 4. Hemorrhagic cyst in the right adnexa measures 2.8 cm. 5. Asymmetric facet degenerative changes contribute to moderate left foraminal stenosis at L5-S1. Electronically Signed   By: Audree Leas M.D.   On: 03/01/2024 12:10   US  Abdomen Limited RUQ  (LIVER/GB) Result Date: 03/01/2024 CLINICAL DATA:  Right upper quadrant pain for 2 days. History of gallstones. EXAM: ULTRASOUND ABDOMEN LIMITED RIGHT UPPER QUADRANT COMPARISON:  None Available. FINDINGS: Gallbladder: The gallbladder wall thickness is upper limits of normal measuring 2.9 mm. No pericholecystic fluid or sonographic Murphy's sign. There is a stone within the gallbladder which measures 2.6 cm. Common bile duct: Diameter: 2.0 mm Liver: Normal parenchymal echogenicity. Scattered liver cysts are identified. The largest cyst is in the dome of liver measuring 12.5 x 10.5 x 11.8 cm. Portal vein is patent on color Doppler imaging with normal direction of blood flow towards the liver. Other: None. IMPRESSION: 1. Cholelithiasis without secondary signs of acute cholecystitis. 2. Scattered liver cysts. The largest cyst is in the dome of  liver measuring 12.5 x 10.5 x 11.8 cm. Electronically Signed   By: Kimberley Penman M.D.   On: 03/01/2024 11:34

## 2024-03-30 NOTE — Patient Instructions (Addendum)

## 2024-03-30 NOTE — Progress Notes (Signed)
Patient is taking Sprycel as prescribed. She has not missed any doses and reports no side effects at this time.   

## 2024-04-02 ENCOUNTER — Encounter (HOSPITAL_BASED_OUTPATIENT_CLINIC_OR_DEPARTMENT_OTHER): Payer: BC Managed Care – PPO | Admitting: Cardiovascular Disease

## 2024-05-06 ENCOUNTER — Other Ambulatory Visit: Payer: Self-pay | Admitting: Hematology

## 2024-05-06 DIAGNOSIS — C9210 Chronic myeloid leukemia, BCR/ABL-positive, not having achieved remission: Secondary | ICD-10-CM

## 2024-05-07 DIAGNOSIS — L720 Epidermal cyst: Secondary | ICD-10-CM | POA: Diagnosis not present

## 2024-05-10 ENCOUNTER — Other Ambulatory Visit: Payer: Self-pay | Admitting: Family Medicine

## 2024-05-10 DIAGNOSIS — I10 Essential (primary) hypertension: Secondary | ICD-10-CM

## 2024-06-02 ENCOUNTER — Emergency Department (HOSPITAL_COMMUNITY)
Admission: EM | Admit: 2024-06-02 | Discharge: 2024-06-03 | Disposition: A | Discharge status: Home / Self Care | Attending: Emergency Medicine | Admitting: Emergency Medicine

## 2024-06-02 ENCOUNTER — Encounter (HOSPITAL_BASED_OUTPATIENT_CLINIC_OR_DEPARTMENT_OTHER): Payer: Self-pay | Admitting: Cardiovascular Disease

## 2024-06-02 ENCOUNTER — Encounter (HOSPITAL_COMMUNITY): Payer: Self-pay

## 2024-06-02 ENCOUNTER — Emergency Department (HOSPITAL_COMMUNITY)

## 2024-06-02 ENCOUNTER — Ambulatory Visit (HOSPITAL_BASED_OUTPATIENT_CLINIC_OR_DEPARTMENT_OTHER): Admitting: Cardiovascular Disease

## 2024-06-02 ENCOUNTER — Other Ambulatory Visit: Payer: Self-pay

## 2024-06-02 VITALS — BP 112/70 | HR 64 | Ht 66.0 in | Wt 212.0 lb

## 2024-06-02 DIAGNOSIS — I10 Essential (primary) hypertension: Secondary | ICD-10-CM

## 2024-06-02 DIAGNOSIS — R112 Nausea with vomiting, unspecified: Secondary | ICD-10-CM | POA: Diagnosis not present

## 2024-06-02 DIAGNOSIS — Z87891 Personal history of nicotine dependence: Secondary | ICD-10-CM | POA: Insufficient documentation

## 2024-06-02 DIAGNOSIS — R519 Headache, unspecified: Secondary | ICD-10-CM | POA: Diagnosis not present

## 2024-06-02 MED ORDER — KETOROLAC TROMETHAMINE 15 MG/ML IJ SOLN
15.0000 mg | Freq: Once | INTRAMUSCULAR | Status: AC
  Administered 2024-06-03: 15 mg via INTRAVENOUS
  Filled 2024-06-02: qty 1

## 2024-06-02 MED ORDER — PROCHLORPERAZINE EDISYLATE 10 MG/2ML IJ SOLN
10.0000 mg | Freq: Once | INTRAMUSCULAR | Status: AC
  Administered 2024-06-03: 10 mg via INTRAVENOUS
  Filled 2024-06-02: qty 2

## 2024-06-02 MED ORDER — SODIUM CHLORIDE 0.9 % IV BOLUS
1000.0000 mL | Freq: Once | INTRAVENOUS | Status: AC
  Administered 2024-06-03: 1000 mL via INTRAVENOUS

## 2024-06-02 MED ORDER — DIPHENHYDRAMINE HCL 50 MG/ML IJ SOLN
25.0000 mg | Freq: Once | INTRAMUSCULAR | Status: AC
  Administered 2024-06-03: 25 mg via INTRAVENOUS
  Filled 2024-06-02: qty 1

## 2024-06-02 NOTE — Progress Notes (Signed)
 "  Advanced [facility]pertension [facility] Follow Up:    Date:  06/02/2024   ID:  Marcia Hahn, DOB 01-Sep-1976, MRN [id number]  PCP:  Marcia Rollene BRAVO, MD  Cardiologist:  None  Nephrologist:  Referring MD: Marcia Rollene BRAVO, MD   CC: Hypertension  History of Present Illness:    Marcia Hahn is a 48 y.o. female with a hx of hypertension, CML, OSA, remote tobacco abuse, and hyperlipidemia here for follow-up.  She was first seen by Reche Finder, NP in the [facility] 12/2022 prior to that she had seen Dr. Okey most recently in 2021.  She had been on amlodipine  which was discontinued due to edema.  She was first diagnosed with hypertension in her 30s or early 48s.  Blood pressures at home were in the 120s over 60s.  Blood pressure was also controlled in the office.  Therefore no changes were made.  She was referred for a sleep study which did reveal sleep apnea and a CPAP was recommended.  At her visit 02/2023 blood pressures were generally well-controlled.  She only noted elevated readings in the setting of forgetting her medication.  She was noted to have OSA and plan to speak with her dentist about getting an oral airway device.  It was noted that she had a murmur on exam.  She had an echocardiogram 04/2023 that revealed LVEF 60-65% with normal diastolic function.  There were no valvular abnormalities.  At her visit   Discussed the use of AI scribe software for clinical note transcription with the patient, who gave verbal consent to proceed.  History of Present Illness [name] reports that her blood pressure readings are good when she takes her medication, but not at the desired level if she misses her medication. She experiences occasional lightheadedness and dizziness, particularly when moving around, but not when rolling over in bed. She takes all her blood pressure medications at the same time each day.  She is actively working on [employer], aiming to reach 200  pounds from her highest weight of 228 pounds. She exercises three to four times a week, engaging in water aerobics and using a walking pad at home. She is making dietary changes to support her weight loss goals.  She experiences headaches, including a migraine on Sunday, which resolved but left her feeling 'funny' the following day. She consumes a small amount of black coffee daily and is attempting to reduce her caffeine intake.  She describes experiencing a 'jab' like pain in her chest that occurred almost all last week and resolved with ibuprofen . The pain does not occur during exercise, which she performs regularly without discomfort.  Her cholesterol levels have improved, with her LDL decreasing from 125 to 115 since her last blood work in May.   Previous antihypertensives: Amlodipine  - headache Hydralazine   Clonidine    Past Medical History:  Diagnosis Date   Abnormal mammogram of right breast 09/03/2018   Acne vulgaris    CML (chronic myelocytic leukemia) (HCC)    Essential hypertension 02/27/2008   Qualifier: Diagnosis of   By: Jodene LPN, Jaime       Hypertension    Mild obesity    Murmur 03/07/2023   OSA (obstructive sleep apnea) 03/07/2023    Past Surgical History:  Procedure Laterality Date   BREAST BIOPSY Left    CESAREAN SECTION     COLONOSCOPY WITH PROPOFOL  N/A 03/17/2021   Procedure: COLONOSCOPY WITH PROPOFOL ;  Surgeon: Cindie Carlin POUR, DO;  [facility];  Service: Endoscopy;  Laterality: N/A;  ASA II / AM procedure   DILATATION AND CURETTAGE/HYSTEROSCOPY WITH MINERVA N/A 10/26/2020   Procedure: DILATATION AND CURETTAGE/HYSTEROSCOPY WITH MINERVA ENDOMETRIAL ABLATION;  Surgeon: Jayne Vonn DEL, MD;  Location: [facility];  Service: Gynecology;  Laterality: N/A;   LAPAROSCOPIC BILATERAL SALPINGECTOMY Bilateral 10/26/2020   Procedure: LAPAROSCOPIC BILATERAL SALPINGECTOMY;  Surgeon: Jayne Vonn DEL, MD;  Location: [facility];  Service: Gynecology;  Laterality:  Bilateral;   POLYPECTOMY  03/17/2021   Procedure: POLYPECTOMY;  Surgeon: Cindie Carlin POUR, DO;  Location: [facility];  Service: Endoscopy;;    Current Medications: Current Meds  Medication Sig   dasatinib  (SPRYCEL ) 70 MG tablet TAKE 1 TABLET BY MOUTH ONCE DAILY AT THE SAME TIME. MAY TAKE WITH OR WITHOUT FOOD. SWALLOW WHOLE. AVOID GRAPEFRUIT PRODUCTS.   diltiazem  (CARDIZEM  CD) 120 MG 24 hr capsule TAKE 1 CAPSULE DAILY   losartan  (COZAAR ) 100 MG tablet TAKE 1 TABLET DAILY   Multiple Vitamins-Minerals (MULTIVITAMIN WITH MINERALS) tablet Take 1 tablet by mouth at bedtime.   spironolactone  (ALDACTONE ) 100 MG tablet TAKE 1 TABLET DAILY   Vitamin D , Ergocalciferol , (DRISDOL ) 1.25 MG (50000 UNIT) CAPS capsule Take 1 capsule (50,000 Units total) by mouth every 7 (seven) days.     Allergies:   Amlodipine  and Tomato   Social History   Socioeconomic History   Marital status: Married    Spouse name: Not on file   Number of children: 2   Years of education: Not on file   Highest education level: Bachelor's degree (e.g., BA, AB, BS)  Occupational History   Occupation: set designer   Tobacco Use   Smoking status: Former    Current packs/day: 0.00    Types: Cigarettes    Quit date: 12/04/2012    Years since quitting: 11.5   Smokeless tobacco: Never  Vaping Use   Vaping status: Never Used  Substance and Sexual Activity   Alcohol use: Yes    Comment: occasional   Drug use: No   Sexual activity: Not Currently    Birth control/protection: Surgical    Comment: tubal and ablation  Other Topics Concern   Not on file  Social History Narrative   Mother of twins    Social Drivers of Health   Financial Resource Strain: Low Risk  (03/23/2024)   Overall Financial Resource Strain (CARDIA)    Difficulty of Paying Living Expenses: Not hard at all  Food Insecurity: No Food Insecurity (03/23/2024)   Hunger Vital Sign    Worried About Running Out of Food in the Last Year: Never true    Ran Out  of Food in the Last Year: Never true  Transportation Needs: No Transportation Needs (03/23/2024)   PRAPARE - Administrator, Civil Service (Medical): No    Lack of Transportation (Non-Medical): No  Physical Activity: Insufficiently Active (03/23/2024)   Exercise Vital Sign    Days of Exercise per Week: 2 days    Minutes of Exercise per Session: 30 min  Stress: No Stress Concern Present (03/23/2024)   [employer] of Occupational Health - Occupational Stress Questionnaire    Feeling of Stress : Not at all  Social Connections: Socially Integrated (03/23/2024)   Social Connection and Isolation Panel    Frequency of Communication with Friends and Family: More than three times a week    Frequency of Social Gatherings with Friends and Family: Three times a week    Attends Religious Services: More than 4  times per year    Active Member of Clubs or Organizations: Yes    Attends Engineer, Structural: More than 4 times per year    Marital Status: Married     Family History: The patient's family history includes Breast cancer in her cousin and maternal grandmother; Diabetes in her mother; GER disease in her mother; Heart attack (age of onset: 18) in her father; Heart disease in her father; Hypertension in her mother; Migraines in her sister; Stroke in her maternal aunt and maternal grandmother. There is no history of Colon cancer.  ROS:   Please see the history of present illness.    All other systems reviewed and are negative.  EKGs/Labs/Other Studies Reviewed:    Echo 04/2023:  1. Probable liver cysts; suggest liver ultrasound to further assess.   2. Left ventricular ejection fraction, by estimation, is 60 to 65%. The  left ventricle has normal function. The left ventricle has no regional  wall motion abnormalities. Left ventricular diastolic parameters were  normal. The average left ventricular  global longitudinal strain is -18.7 %. The global longitudinal strain is   normal.   3. Right ventricular systolic function is normal. The right ventricular  size is normal.   4. The mitral valve is normal in structure. No evidence of mitral valve  regurgitation. No evidence of mitral stenosis.   5. The aortic valve is tricuspid. Aortic valve regurgitation is not  visualized. Aortic valve sclerosis is present, with no evidence of aortic  valve stenosis.   6. The inferior vena cava is normal in size with greater than 50%  respiratory variability, suggesting right atrial pressure of 3 mmHg.    Bilateral Renal Artery Doppler  01/14/2023: Summary:  Renal:    Right: Normal size right kidney. Normal right Resisitive Index.         Normal cortical thickness of right kidney. No evidence of         right renal artery stenosis. RRV flow present.  Left:  Normal size of left kidney. Normal left Resistive Index.         Normal cortical thickness of the left kidney. No evidence of         left renal artery stenosis. LRV flow present. Cyst(s) noted.         Mid pole cortical cyst measuring 1.8 cm x 1.9 cm x 1.7 cm.  Mesenteric:  Normal Celiac artery and Superior Mesenteric artery findings.  Patent IVC.   Bilateral Carotid Doppler  04/22/2012: IMPRESSION:  Normal carotid Doppler ultrasound.   EKG:  EKG is personally reviewed. 03/07/2023:  EKG was not ordered.  Recent Labs: 03/01/2024: Hemoglobin 14.0; Platelets 64 03/20/2024: ALT 31; BUN 14; Creatinine, Ser 1.00; Potassium 4.3; Sodium 139   Recent Lipid Panel    Component Value Date/Time   CHOL 185 03/20/2024 1221   CHOL 173 08/20/2023 1330   TRIG 140 03/20/2024 1221   TRIG 173 (H) 08/20/2023 1330   HDL 45 03/20/2024 1221   CHOLHDL 4.1 03/20/2024 1221   CHOLHDL 3.8 06/30/2020 0841   VLDL 18 06/30/2020 0841   LDLCALC 115 (H) 03/20/2024 1221   LDLCALC 85 10/07/2019 1021    Physical Exam:    VS:  BP 112/70   Pulse 64   Ht 5' 6 (1.676 m)   Wt 212 lb (96.2 kg)   SpO2 97%   BMI 34.22 kg/m  , BMI Body mass  index is 34.22 kg/m. GENERAL:  Well appearing HEENT: Pupils equal  round and reactive, fundi not visualized, oral mucosa unremarkable NECK:  No jugular venous distention, waveform within normal limits, carotid upstroke brisk and symmetric, no bruits, no thyromegaly LYMPHATICS:  No cervical adenopathy HEART:  RRR.  PMI not displaced or sustained,S1 and S2 within normal limits, no S3, no S4, no clicks, no rubs, 2/6 systolic murmur at the LUSB ABD:  Flat, positive bowel sounds normal in frequency in pitch, no bruits, no rebound, no guarding, no midline pulsatile mass, no hepatomegaly, no splenomegaly EXT:  2 plus pulses throughout, no edema, no cyanosis no clubbing SKIN:  No rashes no nodules NEURO:  Cranial nerves II through XII grossly intact, motor grossly intact throughout PSYCH:  Cognitively intact, oriented to person place and time   ASSESSMENT/PLAN:    Assessment & Plan # Hypertension Well-controlled with medication. Occasional dizziness may relate to medication timing. - Switch losartan  to nighttime dosing.  Continue diltiazem  and spironolactone .  - Monitor home blood pressure. Report if consistently low or dizziness persists. - Consider medication reduction if low blood pressure and dizziness continue.  # Dizziness Intermittent. - Switch losartan  to nighttime dosing.  # Chest Pain Intermittent, likely musculoskeletal, not exercise-related. - Monitor for exercise-related chest pain. Consider further cardiac evaluation if pain occurs during exercise.  # Hyperlipidemia Cholesterol levels improving. LDL decreased from 125 to 884.  # Migraine Recent migraine resolved with ibuprofen . Occasional headaches possibly due to caffeine withdrawal. - Consider caffeine as a remedy for headaches.    Screening for Secondary Hypertension:     12/14/2022    6:14 PM  Causes  Renovascular HTN Screened  Sleep Apnea Screened  Thyroid  Disease Screened  Hyperaldosteronism Not Screened   Pheochromocytoma Not Screened  Cushing's Syndrome Not Screened  Hyperparathyroidism Not Screened  Coarctation of the Aorta Screened     - Comments BP symmetrical    Relevant Labs/Studies:    Latest Ref Rng & Units 03/20/2024   12:21 PM 03/01/2024   10:38 AM 08/20/2023    1:30 PM  Basic Labs  Sodium 134 - 144 mmol/L 139  137  137   Potassium 3.5 - 5.2 mmol/L 4.3  2.9  4.1   Creatinine 0.57 - 1.00 mg/dL 8.99  8.89  8.91        Latest Ref Rng & Units 02/19/2023   12:02 PM 11/21/2021   11:20 AM  Thyroid    TSH 0.450 - 4.500 uIU/mL 1.860  2.160                 01/14/2023   10:22 AM  Renovascular   Renal Artery US  Completed Yes     Disposition:    FU with Payden Bonus C. Raford, MD, [facility] in 6 months.  Medication Adjustments/Labs and Tests Ordered: Current medicines are reviewed at length with the patient today.  Concerns regarding medicines are outlined above.   Orders Placed This Encounter  Procedures   EKG 12-Lead   No orders of the defined types were placed in this encounter.    Signed, Annabella Raford, MD  06/02/2024 3:51 PM    [facility]  "

## 2024-06-02 NOTE — ED Triage Notes (Signed)
Pt c/o headache with n/v since Sunday.

## 2024-06-02 NOTE — Patient Instructions (Signed)
 Medication Instructions:  START TAKING THE LOSARTAN  IN THE EVENING  Labwork: NONE  Testing/Procedures: NONE  Follow-Up: 6 MONTHS WITH CAITLIN W NP OR DR [place]   Any Other Special Instructions Will Be Listed Below (If Applicable).     If you need a refill on your cardiac medications before your next appointment, please call your pharmacy.

## 2024-06-03 ENCOUNTER — Encounter (HOSPITAL_BASED_OUTPATIENT_CLINIC_OR_DEPARTMENT_OTHER): Payer: Self-pay | Admitting: Cardiovascular Disease

## 2024-06-03 DIAGNOSIS — R519 Headache, unspecified: Secondary | ICD-10-CM | POA: Diagnosis not present

## 2024-06-03 NOTE — ED Provider Notes (Signed)
 [facility] Emergency Department Provider Note MRN:  [id number]  Arrival date & time: 06/03/24     Chief Complaint   Headache   History of Present Illness   Marcia Hahn is a 48 y.o. year-old female with a history of hypertension, CML presenting to the ED with chief complaint of headache.  Persistent headache for the past 2 or 3 days.  Gets some mild relief with Excedrin but headache keeps coming back.  Has never had a headache last this long.  Dull frontal pain described as tension.  Denies numbness or weakness to the arms or legs.  Pain is worse with bright lights and loud noises, associated with some occasional nausea.  Episode of vomiting today.  Review of Systems  A thorough review of systems was obtained and all systems are negative except as noted in the HPI and PMH.   Patient's Health History    Past Medical History:  Diagnosis Date   Abnormal mammogram of right breast 09/03/2018   Acne vulgaris    CML (chronic myelocytic leukemia) (HCC)    Dyslipidemia (high LDL; low HDL) 06/12/2017   Elevated ALT measurement 08/09/2023   Essential hypertension 02/27/2008   Qualifier: Diagnosis of   By: Jodene LPN, Jaime       Hepatic cyst 08/08/2023   Hypertension    IDA (iron  deficiency anemia) 06/12/2017   Localized swelling, mass and lump, neck 05/02/2020   Malignant hypertension 02/08/2020   Mild obesity    Murmur 03/07/2023   Optic nerve swelling 07/07/2012   OSA (obstructive sleep apnea) 03/07/2023   Osteoarthritis 10/31/2020   Osteochondral defect of ankle 12/11/2012   Tibialis posterior tendinitis 10/31/2020   Vision abnormalities 04/29/2013    Past Surgical History:  Procedure Laterality Date   BREAST BIOPSY Left    CESAREAN SECTION     COLONOSCOPY WITH PROPOFOL  N/A 03/17/2021   Procedure: COLONOSCOPY WITH PROPOFOL ;  Surgeon: Cindie Carlin POUR, DO;  [facility];  Service: Endoscopy;  Laterality: N/A;  ASA II / AM procedure    DILATATION AND CURETTAGE/HYSTEROSCOPY WITH MINERVA N/A 10/26/2020   Procedure: DILATATION AND CURETTAGE/HYSTEROSCOPY WITH MINERVA ENDOMETRIAL ABLATION;  Surgeon: Jayne Vonn DEL, MD;  Location: [facility];  Service: Gynecology;  Laterality: N/A;   LAPAROSCOPIC BILATERAL SALPINGECTOMY Bilateral 10/26/2020   Procedure: LAPAROSCOPIC BILATERAL SALPINGECTOMY;  Surgeon: Jayne Vonn DEL, MD;  Location: [facility];  Service: Gynecology;  Laterality: Bilateral;   POLYPECTOMY  03/17/2021   Procedure: POLYPECTOMY;  Surgeon: Cindie Carlin POUR, DO;  [facility];  Service: Endoscopy;;    Family History  Problem Relation Age of Onset   GER disease Mother    Diabetes Mother    Hypertension Mother    Heart attack Father 22   Heart disease Father    Migraines Sister    Breast cancer Maternal Grandmother    Stroke Maternal Grandmother    Breast cancer Cousin    Stroke Maternal Aunt    Colon cancer Neg Hx     Social History   Socioeconomic History   Marital status: Married    Spouse name: Not on file   Number of children: 2   Years of education: Not on file   Highest education level: Bachelor's degree (e.g., BA, AB, BS)  Occupational History   Occupation: Set designer   Tobacco Use   Smoking status: Former    Current packs/day: 0.00    Types: Cigarettes    Quit date: 12/04/2012    Years  since quitting: 11.5   Smokeless tobacco: Never  Vaping Use   Vaping status: Never Used  Substance and Sexual Activity   Alcohol use: Yes    Comment: occasional   Drug use: No   Sexual activity: Not Currently    Birth control/protection: Surgical    Comment: tubal and ablation  Other Topics Concern   Not on file  Social History Narrative   Mother of twins    Social Drivers of Health   Financial Resource Strain: Low Risk  (03/23/2024)   Overall Financial Resource Strain (CARDIA)    Difficulty of Paying Living Expenses: Not hard at all  Food Insecurity: No Food Insecurity (03/23/2024)   Hunger Vital  Sign    Worried About Running Out of Food in the Last Year: Never true    Ran Out of Food in the Last Year: Never true  Transportation Needs: No Transportation Needs (03/23/2024)   PRAPARE - Administrator, Civil Service (Medical): No    Lack of Transportation (Non-Medical): No  Physical Activity: Insufficiently Active (03/23/2024)   Exercise Vital Sign    Days of Exercise per Week: 2 days    Minutes of Exercise per Session: 30 min  Stress: No Stress Concern Present (03/23/2024)   [employer] of Occupational Health - Occupational Stress Questionnaire    Feeling of Stress : Not at all  Social Connections: Socially Integrated (03/23/2024)   Social Connection and Isolation Panel    Frequency of Communication with Friends and Family: More than three times a week    Frequency of Social Gatherings with Friends and Family: Three times a week    Attends Religious Services: More than 4 times per year    Active Member of Clubs or Organizations: Yes    Attends Banker Meetings: More than 4 times per year    Marital Status: Married  Catering manager Violence: Not At Risk (08/25/2020)   Humiliation, Afraid, Rape, and Kick questionnaire    Fear of Current or Ex-Partner: No    Emotionally Abused: No    Physically Abused: No    Sexually Abused: No     Physical Exam   Vitals:   06/02/24 2317 06/02/24 2330  BP: (!) 143/80 125/77  Pulse: 67 69  Resp: 17 17  Temp: 98 F (36.7 C)   SpO2: 98% 97%    CONSTITUTIONAL: Well-appearing, NAD NEURO/PSYCH:  Alert and oriented x 3, normal and symmetric strength and sensation, normal coordination, normal speech EYES:  eyes equal and reactive ENT/NECK:  no LAD, no JVD CARDIO: Regular rate, well-perfused, normal S1 and S2 PULM:  CTAB no wheezing or rhonchi GI/GU:  non-distended, non-tender MSK/SPINE:  No gross deformities, no edema SKIN:  no rash, atraumatic   *Additional and/or pertinent findings included in MDM  below  Diagnostic and Interventional Summary    EKG Interpretation Date/Time:    Ventricular Rate:    PR Interval:    QRS Duration:    QT Interval:    QTC Calculation:   R Axis:      Text Interpretation:         Labs Reviewed - No data to display  CT HEAD WO CONTRAST ( )  Final Result      Medications  prochlorperazine  (COMPAZINE ) injection 10 mg (has no administration in time range)  diphenhydrAMINE  (BENADRYL ) injection 25 mg (has no administration in time range)  sodium chloride  0.9 % bolus 1,000 mL (1,000 mLs Intravenous New Bag/Given 06/03/24 0015)  ketorolac  (  TORADOL ) 15 MG/ML injection 15 mg (15 mg Intravenous Given 06/03/24 0013)     Procedures  /  Critical Care Procedures  ED Course and Medical Decision Making  Initial Impression and Ddx Well-appearing, looks mildly uncomfortable due to headache.  Vitals are normal, no meningismus, no fever.  Could be a migraine or tension headache.  However some atypical features for her, does not normally have a headache this long and so we will obtain CT head to exclude intracranial mass lesion, doubt vascular emergency given the gradual nature and reassuring exam.  Past medical/surgical history that increases complexity of ED encounter: CML, hypertension  Interpretation of Diagnostics I personally reviewed the CT head and my interpretation is as follows: No acute abnormalities    Patient Reassessment and Ultimate Disposition/Management     Patient is feeling a lot better, with CT head normal patient is appropriate for discharge.  Patient management required discussion with the following services or consulting groups:  None  Complexity of Problems Addressed Acute illness or injury that poses threat of life of bodily function  Additional Data Reviewed and Analyzed Further history obtained from: Further history from spouse/family member  Additional Factors Impacting ED Encounter Risk None  Ozell HERO. Theadore,  MD [facility] Emergency Medicine [facility] [email or web address]  Final Clinical Impressions(s) / ED Diagnoses     ICD-10-CM   1. Acute nonintractable headache, unspecified headache type  R51.9       ED Discharge Orders     None        Discharge Instructions Discussed with and Provided to Patient:     Discharge Instructions      You were evaluated in the Emergency Department and after careful evaluation, we did not find any emergent condition requiring admission or further testing in the hospital.  Your exam/testing today is overall reassuring.  Recommend close follow-up with your primary care doctor if your headaches continue.  Can use Tylenol  or Motrin  for lingering discomfort.  Please return to the Emergency Department if you experience any worsening of your condition.   Thank you for allowing us  to be a part of your care.       Theadore Ozell HERO, MD 06/03/24 [phone]

## 2024-06-03 NOTE — Discharge Instructions (Addendum)
 You were evaluated in the Emergency Department and after careful evaluation, we did not find any emergent condition requiring admission or further testing in the hospital.  Your exam/testing today is overall reassuring.  Recommend close follow-up with your primary care doctor if your headaches continue.  Can use Tylenol  or Motrin  for lingering discomfort.  Please return to the Emergency Department if you experience any worsening of your condition.   Thank you for allowing us  to be a part of your care.

## 2024-07-27 ENCOUNTER — Other Ambulatory Visit: Payer: Self-pay | Admitting: Family Medicine

## 2024-07-27 DIAGNOSIS — E559 Vitamin D deficiency, unspecified: Secondary | ICD-10-CM

## 2024-08-31 ENCOUNTER — Ambulatory Visit: Payer: Self-pay

## 2024-08-31 DIAGNOSIS — R030 Elevated blood-pressure reading, without diagnosis of hypertension: Secondary | ICD-10-CM | POA: Diagnosis not present

## 2024-08-31 DIAGNOSIS — J0140 Acute pansinusitis, unspecified: Secondary | ICD-10-CM | POA: Diagnosis not present

## 2024-08-31 DIAGNOSIS — R519 Headache, unspecified: Secondary | ICD-10-CM | POA: Diagnosis not present

## 2024-08-31 NOTE — Telephone Encounter (Signed)
Noted appointment made

## 2024-08-31 NOTE — Telephone Encounter (Signed)
 FYI Only or Action Required?: FYI only for provider.  Patient was last seen in primary care on 03/27/2024 by Antonetta Rollene BRAVO, MD.  Called Nurse Triage reporting Migraine.  Symptoms began yesterday.  Interventions attempted: OTC medications: Tylenol  excedrin and Rest, hydration, or home remedies.  Symptoms are: stable.  Triage Disposition: See PCP When Office is Open (Within 3 Days)  Patient/caregiver understands and will follow disposition?: Yes Reason for Disposition  [1] MILD-MODERATE headache AND [2] present > 3 days (72 hours) AND [3] no improvement after using Care Advice  Answer Assessment - Initial Assessment Questions Patient denies vision changes, but feels nauseous. Taken Tylenol  and Excedrin, mild relief. Patient states she has high BP, compliant with medications. States last night BP was 156/102.   1. LOCATION: Where does it hurt?      Left side, from eye to temple  2. ONSET: When did the headache start? (e.g., minutes, hours, days)      Yesterday afternoon  3. PATTERN: Does the pain come and go, or has it been constant since it started?     Constant  4. SEVERITY: How bad is the pain? and What does it keep you from doing?  (e.g., Scale 1-10; mild, moderate, or severe)     7/10  5. RECURRENT SYMPTOM: Have you ever had headaches before? If Yes, ask: When was the last time? and What happened that time?      Yes, been happening more than normal  6. CAUSE: What do you think is causing the headache?     Unsure  7. MIGRAINE: Have you been diagnosed with migraine headaches? If Yes, ask: Is this headache similar?      No  8. HEAD INJURY: Has there been any recent injury to your head?      Denies  9. OTHER SYMPTOMS: Do you have any other symptoms? (e.g., fever, stiff neck, eye pain, sore throat, cold symptoms)     Denies  Protocols used: [facility]  Copied from CRM #[id number]. Topic: Clinical - Red Word Triage >> Aug 31, 2024  2:57 PM  Gustabo D wrote: Pt been having a lot of migraines and it makes her sensitive to smell and light. Makes her nauseous  She says it's so bad she just want to lay her head down

## 2024-09-02 ENCOUNTER — Ambulatory Visit: Payer: Self-pay | Admitting: Family Medicine

## 2024-09-08 ENCOUNTER — Other Ambulatory Visit: Payer: Self-pay

## 2024-09-08 DIAGNOSIS — E559 Vitamin D deficiency, unspecified: Secondary | ICD-10-CM | POA: Diagnosis not present

## 2024-09-08 DIAGNOSIS — C9210 Chronic myeloid leukemia, BCR/ABL-positive, not having achieved remission: Secondary | ICD-10-CM

## 2024-09-08 DIAGNOSIS — E785 Hyperlipidemia, unspecified: Secondary | ICD-10-CM | POA: Diagnosis not present

## 2024-09-08 DIAGNOSIS — R7303 Prediabetes: Secondary | ICD-10-CM | POA: Diagnosis not present

## 2024-09-09 LAB — CMP14+EGFR
ALT: 24 IU/L (ref 0–32)
AST: 27 IU/L (ref 0–40)
Albumin: 4.5 g/dL (ref 3.9–4.9)
Alkaline Phosphatase: 59 IU/L (ref 41–116)
BUN/Creatinine Ratio: 11 (ref 9–23)
BUN: 14 mg/dL (ref 6–24)
Bilirubin Total: 0.3 mg/dL (ref 0.0–1.2)
CO2: 21 mmol/L (ref 20–29)
Calcium: 9.7 mg/dL (ref 8.7–10.2)
Chloride: 102 mmol/L (ref 96–106)
Creatinine, Ser: 1.31 mg/dL — ABNORMAL HIGH (ref 0.57–1.00)
Globulin, Total: 2.7 g/dL (ref 1.5–4.5)
Glucose: 79 mg/dL (ref 70–99)
Potassium: 4.4 mmol/L (ref 3.5–5.2)
Sodium: 138 mmol/L (ref 134–144)
Total Protein: 7.2 g/dL (ref 6.0–8.5)
eGFR: 50 mL/min/1.73 — ABNORMAL LOW (ref >59)

## 2024-09-09 LAB — LIPID PANEL
Chol/HDL Ratio: 4.1 ratio (ref 0.0–4.4)
Cholesterol, Total: 177 mg/dL (ref 100–199)
HDL: 43 mg/dL (ref >39)
LDL Chol Calc (NIH): 109 mg/dL — ABNORMAL HIGH (ref 0–99)
Triglycerides: 143 mg/dL (ref 0–149)
VLDL Cholesterol Cal: 25 mg/dL (ref 5–40)

## 2024-09-09 LAB — VITAMIN D 25 HYDROXY (VIT D DEFICIENCY, FRACTURES): Vit D, 25-Hydroxy: 30.8 ng/mL (ref 30.0–100.0)

## 2024-09-09 LAB — TSH: TSH: 1.82 u[IU]/mL (ref 0.450–4.500)

## 2024-09-09 LAB — HEMOGLOBIN A1C
Est. average glucose Bld gHb Est-mCnc: 103 mg/dL
Hgb A1c MFr Bld: 5.2 % (ref 4.8–5.6)

## 2024-09-11 ENCOUNTER — Ambulatory Visit: Payer: Self-pay | Admitting: Family Medicine

## 2024-09-15 ENCOUNTER — Other Ambulatory Visit: Payer: Self-pay | Admitting: *Deleted

## 2024-09-15 DIAGNOSIS — C9210 Chronic myeloid leukemia, BCR/ABL-positive, not having achieved remission: Secondary | ICD-10-CM

## 2024-09-21 DIAGNOSIS — N958 Other specified menopausal and perimenopausal disorders: Secondary | ICD-10-CM | POA: Diagnosis not present

## 2024-09-21 DIAGNOSIS — N941 Unspecified dyspareunia: Secondary | ICD-10-CM | POA: Diagnosis not present

## 2024-09-21 DIAGNOSIS — Z1330 Encounter for screening examination for mental health and behavioral disorders, unspecified: Secondary | ICD-10-CM | POA: Diagnosis not present

## 2024-09-28 ENCOUNTER — Ambulatory Visit: Admitting: Hematology

## 2024-09-29 ENCOUNTER — Inpatient Hospital Stay: Admitting: Oncology

## 2024-09-29 ENCOUNTER — Inpatient Hospital Stay: Attending: Oncology | Admitting: Oncology

## 2024-09-29 ENCOUNTER — Ambulatory Visit: Admitting: Hematology

## 2024-09-29 ENCOUNTER — Encounter: Payer: Self-pay | Admitting: Family Medicine

## 2024-09-29 ENCOUNTER — Other Ambulatory Visit (HOSPITAL_COMMUNITY): Payer: Self-pay | Admitting: Family Medicine

## 2024-09-29 ENCOUNTER — Ambulatory Visit (INDEPENDENT_AMBULATORY_CARE_PROVIDER_SITE_OTHER): Admitting: Family Medicine

## 2024-09-29 VITALS — BP 148/76 | HR 62 | Resp 16 | Ht 66.0 in | Wt 219.1 lb

## 2024-09-29 DIAGNOSIS — D649 Anemia, unspecified: Secondary | ICD-10-CM | POA: Insufficient documentation

## 2024-09-29 DIAGNOSIS — I10 Essential (primary) hypertension: Secondary | ICD-10-CM

## 2024-09-29 DIAGNOSIS — N941 Unspecified dyspareunia: Secondary | ICD-10-CM | POA: Diagnosis not present

## 2024-09-29 DIAGNOSIS — C9210 Chronic myeloid leukemia, BCR/ABL-positive, not having achieved remission: Secondary | ICD-10-CM | POA: Insufficient documentation

## 2024-09-29 DIAGNOSIS — M1990 Unspecified osteoarthritis, unspecified site: Secondary | ICD-10-CM | POA: Insufficient documentation

## 2024-09-29 DIAGNOSIS — Z2821 Immunization not carried out because of patient refusal: Secondary | ICD-10-CM | POA: Insufficient documentation

## 2024-09-29 DIAGNOSIS — Z1231 Encounter for screening mammogram for malignant neoplasm of breast: Secondary | ICD-10-CM

## 2024-09-29 DIAGNOSIS — Z0000 Encounter for general adult medical examination without abnormal findings: Secondary | ICD-10-CM | POA: Diagnosis not present

## 2024-09-29 DIAGNOSIS — R161 Splenomegaly, not elsewhere classified: Secondary | ICD-10-CM | POA: Insufficient documentation

## 2024-09-29 DIAGNOSIS — G4733 Obstructive sleep apnea (adult) (pediatric): Secondary | ICD-10-CM

## 2024-09-29 DIAGNOSIS — K7689 Other specified diseases of liver: Secondary | ICD-10-CM | POA: Diagnosis not present

## 2024-09-29 DIAGNOSIS — E785 Hyperlipidemia, unspecified: Secondary | ICD-10-CM

## 2024-09-29 DIAGNOSIS — Z87891 Personal history of nicotine dependence: Secondary | ICD-10-CM | POA: Diagnosis not present

## 2024-09-29 DIAGNOSIS — Z79899 Other long term (current) drug therapy: Secondary | ICD-10-CM | POA: Diagnosis not present

## 2024-09-29 DIAGNOSIS — Z803 Family history of malignant neoplasm of breast: Secondary | ICD-10-CM | POA: Insufficient documentation

## 2024-09-29 DIAGNOSIS — D251 Intramural leiomyoma of uterus: Secondary | ICD-10-CM | POA: Diagnosis not present

## 2024-09-29 NOTE — Assessment & Plan Note (Signed)
 Encouraged and re educated regarding need for influenza and covid vacxcines, declines both

## 2024-09-29 NOTE — Assessment & Plan Note (Signed)
 Reported at visit she deos not hav sleep apnea, or dioes not believe she does , has not followed up on recommendation by Cardiology on record review ion 2024, will re address at next visit

## 2024-09-29 NOTE — Patient Instructions (Addendum)
 Annual exam with Dr Tobie in 1 year  Please sched mammogram due in Jan 28 or after at checkout  Reconsider vaccines  F/U with Dr Antonetta in 16 to 18 weeks, bring cuff  No med changes  Non fasting lab to re assess kidney function 1 week before next appointment  PLEASE work on snacks , change to natural food only  Please commit to at least 5 days per week of exerciser for 30 mins or more  Blood pressure is above goal of under 130, weight loss, eating more veges and fruit , fresh or frozen, regular exercise , and slepp goal of 7 to 8 hrs /night with no sound or light  Keep challenging yourself we see you do well  with this\  Thanks for choosing [facility], we consider it a privelige to serve you.

## 2024-09-29 NOTE — Assessment & Plan Note (Signed)
 Deteriorated  Patient re-educated about  the importance of commitment to a  minimum of 150 minutes of exercise per week as able.  The importance of healthy food choices with portion control discussed, as well as eating regularly and within a 12 hour window most days. The need to choose clean , green food 50 to 75% of the time is discussed, as well as to make water the primary drink and set a goal of 64 ounces water daily.       09/29/2024    2:54 PM 06/02/2024   11:15 PM 06/02/2024    3:32 PM  Weight /BMI  Weight 219 lb 1.9 oz 207 lb 212 lb  Height 5' 6 (1.676 m) 5' 6 (1.676 m) 5' 6 (1.676 m)  BMI 35.37 kg/m2 33.41 kg/m2 34.22 kg/m2

## 2024-09-29 NOTE — Progress Notes (Unsigned)
 [facility] [address], [place] [address]    Clinic Day:  09/29/24   Referring physician: Antonetta Rollene BRAVO, MD  Patient Care Team: Antonetta Rollene BRAVO, MD as PCP - General Carver, Carlin POUR, DO as Consulting Physician (Internal Medicine)   ASSESSMENT & PLAN:   Assessment: 1.  CML in chronic phase: -Presentation with hyperleukocytosis.  BCR/ABL by FISH positive. -CT chest PE protocol showed splenomegaly measuring 14 cm. -BMBX on 02/23/2020 shows hypercellular marrow with myeloid hyperplasia consistent with CML.  No increase in blasts.  Flow cytometry less than 1% blasts.  Chromosome analysis shows Philadelphia chromosome. -Dasatinib  70 mg daily started on 04/01/2020. -BCR/ABL by quantitative PCR on 06/30/2020 shows B3 A2 transcript improved to 2.19 from 128 previously.  B2 A2 and E1 A2 transcripts are undetectable. -BCR/ABL by quantitative PCR on 11/21/2020 -. - BCR/ABL from 03/22/2021 was negative. - BCR/ABL on 06/27/2021 was undetectable. - BCR/ABL on 01/31/2022 ([facility])-0.037% - BCR/ABL (P210 fusion transcript) on 06/19/2022-0.004% - [facility] BCR/ABL PCR on 11/02/2022: P210 transcript at 1.532%. - BCR/ABL by PCR (02/19/2023) negative - BCR/ABL by PCR (05/23/2023): Negative - BCR/ABL by PCR (11/04/2023): Negative - BCR/ABL by PCR (03/20/2024): Negative    Plan: 1.  CML in chronic phase: - She is tolerating Dasatinib  very well.  No infections reported in the past 6 months. - Physical exam: No evidence of fluid retention. - Labs reviewed from 09/08/2024 which show remarkable CBC with hemoglobin 12.4, hematocrit 36.7, platelets 303 with normal white blood cell count.  Creatinine slightly elevated at 1.07 but otherwise unremarkable.  Calcium 9.4.  AST/ALT WNL.  BCR able negative or undetectable.  LDH elevated at 231 but improved from previous..  - Will discuss with Dr. Davonna stopping Dasatinib  as she has been 2+ years with a PCR that is undetectable.  This  was previously discussed with Dr. Katragadda.  Patient is wanting to discontinue with close monitoring. - Reviewed patient's chart and patient did have a detectable BCR able PCR on 11/02/2022-P210 transcript at 1.532%. -Discussed with Dr. Davonna and she would recommend continuing for another 3 months and following up with her to discuss discontinuing medication at that time.  Patient was in agreement and will continue.     2.  Hypertension: - Continue spironolactone , losartan  and Cardizem .  Blood pressure is 148/76.   3.  Normocytic anemia: - Hemoglobin is remaining in the normal range at 11.4.  Orders Placed This Encounter  Procedures   BCR-ABL1, CML/ALL, PCR, QUANT    Standing Status:   Standing    Number of Occurrences:   5    Expiration Date:   09/29/2025   CBC with Differential    Standing Status:   Standing    Number of Occurrences:   4    Expiration Date:   09/29/2025   Comprehensive metabolic panel    Standing Status:   Standing    Number of Occurrences:   4    Expiration Date:   09/29/2025   Lactate dehydrogenase    Standing Status:   Standing    Number of Occurrences:   4    Expiration Date:   09/29/2025     [name], NP   11/18/20253:18 PM  CHIEF COMPLAINT:   Diagnosis: CML    Cancer Staging  No matching staging information was found for the patient.    Prior Therapy: none  Current Therapy:  Dasatinib  70 mg daily    HISTORY OF PRESENT ILLNESS:  Oncology History   No history exists.     INTERVAL HISTORY:   [facility] is a 47 y.o. female seen for follow-up of CML.   Today, she states that she is doing well overall. Her appetite level is at 100%. Her energy level is at 100%.  Reports she has developed daily headaches over the past month or so and she is uncertain if it is related to the Sprycel .  She is ready to stop Sprycel  if she is able.  No new concerns.  No recent hospitalizations or surgeries.  PAST MEDICAL HISTORY:   Past Medical  History: Past Medical History:  Diagnosis Date   Abnormal mammogram of right breast 09/03/2018   Acne vulgaris    CML (chronic myelocytic leukemia) (HCC)    Dyslipidemia (high LDL; low HDL) 06/12/2017   Elevated ALT measurement 08/09/2023   Essential hypertension 02/27/2008   Qualifier: Diagnosis of   By: Jodene LPN, Jaime       Hepatic cyst 08/08/2023   Hypertension    IDA (iron  deficiency anemia) 06/12/2017   Localized swelling, mass and lump, neck 05/02/2020   Malignant hypertension 02/08/2020   Mild obesity    Murmur 03/07/2023   Optic nerve swelling 07/07/2012   OSA (obstructive sleep apnea) 03/07/2023   Osteoarthritis 10/31/2020   Osteochondral defect of ankle 12/11/2012   Tibialis posterior tendinitis 10/31/2020   Vision abnormalities 04/29/2013    Surgical History: Past Surgical History:  Procedure Laterality Date   BREAST BIOPSY Left    CESAREAN SECTION     COLONOSCOPY WITH PROPOFOL  N/A 03/17/2021   Procedure: COLONOSCOPY WITH PROPOFOL ;  Surgeon: Cindie Carlin POUR, DO;  Location: [facility];  Service: Endoscopy;  Laterality: N/A;  ASA II / AM procedure   DILATATION AND CURETTAGE/HYSTEROSCOPY WITH MINERVA N/A 10/26/2020   Procedure: DILATATION AND CURETTAGE/HYSTEROSCOPY WITH MINERVA ENDOMETRIAL ABLATION;  Surgeon: Jayne Vonn DEL, MD;  Location: [facility];  Service: Gynecology;  Laterality: N/A;   LAPAROSCOPIC BILATERAL SALPINGECTOMY Bilateral 10/26/2020   Procedure: LAPAROSCOPIC BILATERAL SALPINGECTOMY;  Surgeon: Jayne Vonn DEL, MD;  Location: [facility];  Service: Gynecology;  Laterality: Bilateral;   POLYPECTOMY  03/17/2021   Procedure: POLYPECTOMY;  Surgeon: Cindie Carlin POUR, DO;  Location: [facility];  Service: Endoscopy;;    Social History: Social History   Socioeconomic History   Marital status: Married    Spouse name: Not on file   Number of children: 2   Years of education: Not on file   Highest education level: Bachelor's degree (e.g., BA, AB, BS)   Occupational History   Occupation: set designer   Tobacco Use   Smoking status: Former    Current packs/day: 0.00    Types: Cigarettes    Quit date: 12/04/2012    Years since quitting: 11.8   Smokeless tobacco: Never  Vaping Use   Vaping status: Never Used  Substance and Sexual Activity   Alcohol use: Yes    Comment: occasional   Drug use: No   Sexual activity: Not Currently    Birth control/protection: Surgical    Comment: tubal and ablation  Other Topics Concern   Not on file  Social History Narrative   Mother of twins    Social Drivers of Health   Financial Resource Strain: Low Risk  (09/25/2024)   Overall Financial Resource Strain (CARDIA)    Difficulty of Paying Living Expenses: Not hard at all  Food Insecurity: No Food Insecurity (09/25/2024)   Hunger Vital Sign    Worried About Running  Out of Food in the Last Year: Never true    Ran Out of Food in the Last Year: Never true  Transportation Needs: No Transportation Needs (09/25/2024)   PRAPARE - Administrator, Civil Service (Medical): No    Lack of Transportation (Non-Medical): No  Physical Activity: Sufficiently Active (09/25/2024)   Exercise Vital Sign    Days of Exercise per Week: 3 days    Minutes of Exercise per Session: 60 min  Stress: No Stress Concern Present (03/23/2024)   [employer] of Occupational Health - Occupational Stress Questionnaire    Feeling of Stress : Not at all  Social Connections: Socially Integrated (09/25/2024)   Social Connection and Isolation Panel    Frequency of Communication with Friends and Family: More than three times a week    Frequency of Social Gatherings with Friends and Family: Three times a week    Attends Religious Services: More than 4 times per year    Active Member of Clubs or Organizations: Yes    Attends Banker Meetings: 1 to 4 times per year    Marital Status: Married  Catering Manager Violence: Not At Risk (08/25/2020)    Humiliation, Afraid, Rape, and Kick questionnaire    Fear of Current or Ex-Partner: No    Emotionally Abused: No    Physically Abused: No    Sexually Abused: No    Family History: Family History  Problem Relation Age of Onset   GER disease Mother    Diabetes Mother    Hypertension Mother    Heart attack Father 40   Heart disease Father    Migraines Sister    Breast cancer Maternal Grandmother    Stroke Maternal Grandmother    Breast cancer Cousin    Stroke Maternal Aunt    Heart attack Paternal Aunt        Massive Heart Attach   Heart failure Maternal Aunt    Colon cancer Neg Hx     Current Medications:  Current Outpatient Medications:    dasatinib  (SPRYCEL ) 70 MG tablet, TAKE 1 TABLET BY MOUTH ONCE DAILY AT THE SAME TIME. MAY TAKE WITH OR WITHOUT FOOD. SWALLOW WHOLE. AVOID GRAPEFRUIT PRODUCTS., Disp: 30 tablet, Rfl: 5   diltiazem  (CARDIZEM  CD) 120 MG 24 hr capsule, TAKE 1 CAPSULE DAILY, Disp: 90 capsule, Rfl: 1   losartan  (COZAAR ) 100 MG tablet, TAKE 1 TABLET DAILY, Disp: 90 tablet, Rfl: 3   Multiple Vitamins-Minerals (MULTIVITAMIN WITH MINERALS) tablet, Take 1 tablet by mouth at bedtime., Disp: , Rfl:    spironolactone  (ALDACTONE ) 100 MG tablet, TAKE 1 TABLET DAILY, Disp: 90 tablet, Rfl: 1   Vitamin D , Ergocalciferol , (DRISDOL ) 1.25 MG (50000 UNIT) CAPS capsule, TAKE 1 CAPSULE EVERY 7 DAYS, Disp: 12 capsule, Rfl: 0   Allergies: Allergies  Allergen Reactions   Amlodipine      Headache    Tomato Itching and Swelling    Processed tomatoes in the can.     REVIEW OF SYSTEMS:   Review of Systems  Neurological:  Positive for headaches.     VITALS:   Blood pressure (P) 133/84, pulse 63, temperature 98 F (36.7 C), temperature source Oral, resp. rate 20, last menstrual period 09/16/2024, SpO2 99%.  Wt Readings from Last 3 Encounters:  09/29/24 219 lb 1.9 oz (99.4 kg)  06/02/24 207 lb (93.9 kg)  06/02/24 212 lb (96.2 kg)    There is no height or weight on file to  calculate BMI.  Performance status (  ECOG): 0 - Asymptomatic   PHYSICAL EXAM:   Physical Exam Constitutional:      Appearance: Normal appearance.  Cardiovascular:     Rate and Rhythm: Normal rate and regular rhythm.  Pulmonary:     Effort: Pulmonary effort is normal.     Breath sounds: Normal breath sounds.  Abdominal:     General: Bowel sounds are normal.     Palpations: Abdomen is soft.  Musculoskeletal:        General: No swelling. Normal range of motion.  Neurological:     Mental Status: She is alert and oriented to person, place, and time. Mental status is at baseline.     LABS:      Latest Ref Rng & Units 03/01/2024   10:38 AM 08/20/2023    1:30 PM 10/09/2021    2:48 PM  CBC  WBC 4.0 - 10.5 K/uL 6.6  6.9  6.8   Hemoglobin 12.0 - 15.0 g/dL 85.9  89.0  88.1   Hematocrit 36.0 - 46.0 % 44.1  33.0  35.7   Platelets 150 - 400 K/uL 64  321  261       Latest Ref Rng & Units 09/08/2024    9:20 AM 03/20/2024   12:21 PM 03/01/2024   10:38 AM  CMP  Glucose 70 - 99 mg/dL 79  81  97   BUN 6 - 24 mg/dL 14  14  16    Creatinine 0.57 - 1.00 mg/dL 8.68  8.99  8.89   Sodium 134 - 144 mmol/L 138  139  137   Potassium 3.5 - 5.2 mmol/L 4.4  4.3  2.9   Chloride 96 - 106 mmol/L 102  104  103   CO2 20 - 29 mmol/L 21  21  24    Calcium 8.7 - 10.2 mg/dL 9.7  8.9  7.8   Total Protein 6.0 - 8.5 g/dL 7.2  6.6  6.8   Total Bilirubin 0.0 - 1.2 mg/dL 0.3  0.3  0.3   Alkaline Phos 41 - 116 IU/L 59  70  74   AST 0 - 40 IU/L 27  28  26    ALT 0 - 32 IU/L 24  31  34      No results found for: CEA1, CEA / No results found for: CEA1, CEA No results found for: PSA1 No results found for: CAN199 No results found for: CAN125  No results found for: STEPHANY RINGS, A1GS, A2GS, EARLA JOANNIE KNIGHTS, MSPIKE, SPEI Lab Results  Component Value Date   TIBC 328 08/20/2023   TIBC 427 06/30/2020   TIBC 365 04/28/2020   FERRITIN 41 08/20/2023   FERRITIN 20  06/30/2020   FERRITIN 20 04/28/2020   IRONPCTSAT 19 08/20/2023   IRONPCTSAT 13 06/30/2020   IRONPCTSAT 13 04/28/2020   Lab Results  Component Value Date   LDH 159 10/09/2021   LDH 154 06/27/2021   LDH 144 03/22/2021     STUDIES:   No results found.

## 2024-09-29 NOTE — Assessment & Plan Note (Signed)
Annual exam as documented. Counseling done  re healthy lifestyle involving commitment to 150 minutes exercise per week, heart healthy diet, and attaining healthy weight.The importance of adequate sleep also discussed. Changes in health habits are decided on by the patient with goals and time frames  set for achieving them. Immunization and cancer screening needs are specifically addressed at this visit. 

## 2024-09-29 NOTE — Assessment & Plan Note (Signed)
 Uncontrolled at visit Lifestyle changes only no med change, check twice weekly at home and bring cuff to next visit DASH diet and commitment to daily physical activity for a minimum of 30 minutes discussed and encouraged, as a part of hypertension management. The importance of attaining a healthy weight is also discussed.     09/29/2024    3:39 PM 09/29/2024    3:38 PM 09/29/2024    2:54 PM 09/29/2024    1:17 PM 09/29/2024    1:12 PM 06/03/2024   12:15 AM 06/02/2024   11:30 PM  BP/Weight  Systolic BP 148 148 146 133 159 145 125  Diastolic BP 76 76 85 84 90 84 77  Wt. (Lbs)   219.12      BMI   35.37 kg/m2

## 2024-09-29 NOTE — Assessment & Plan Note (Signed)
 Improved Hyperlipidemia:Low fat diet discussed and encouraged.   Lipid Panel  Lab Results  Component Value Date   CHOL 177 09/08/2024   HDL 43 09/08/2024   LDLCALC 109 (H) 09/08/2024   TRIG 143 09/08/2024   CHOLHDL 4.1 09/08/2024

## 2024-09-29 NOTE — Progress Notes (Signed)
 [facility]     MRN: [id number]      DOB: Mar 31, 1976  Chief Complaint  Patient presents with   Annual Exam    HPI: Patient is in for annual physical exam. No other health concerns are expressed or addressed at the visit. Recent labs,  are reviewed. Immunization is reviewed , and  declines vaccines   PE: BP (!) 148/76   Pulse 62   Resp 16   Ht 5' 6 (1.676 m)   Wt 219 lb 1.9 oz (99.4 kg)   LMP 09/16/2024 (Exact Date)   SpO2 98%   BMI 35.37 kg/m   Pleasant  female, alert and oriented x 3, in no cardio-pulmonary distress. Afebrile. HEENT No facial trauma or asymetry. Sinuses non tender.  Extra occullar muscles intact.. External ears normal, . Neck: supple, no adenopathy,JVD or thyromegaly.No bruits.  Chest: Clear to ascultation bilaterally.No crackles or wheezes. Non tender to palpation   Cardiovascular system; Heart sounds normal,  S1 and  S2 ,no S3.  Systolic  murmur, ono thrill. Apical beat not displaced Peripheral pulses normal.  Abdomen: Soft, non tender, no organomegaly or masses. No bruits. Bowel sounds normal. No guarding, tenderness or rebound.    Musculoskeletal exam: Full ROM of spine, hips , shoulders and knees. No deformity ,swelling or crepitus noted. No muscle wasting or atrophy.   Neurologic: Cranial nerves 2 to 12 intact. Power, tone ,sensation and reflexes normal throughout. No disturbance in gait. No tremor.  Skin: Intact, no ulceration, erythema , scaling or rash noted. Pigmentation normal throughout  Psych; Normal mood and affect. Judgement and concentration normal   Assessment & Plan:  Annual physical exam Annual exam as documented. Counseling done  re healthy lifestyle involving commitment to 150 minutes exercise per week, heart healthy diet, and attaining healthy weight.The importance of adequate sleep also discussed. . Changes in health habits are decided on by the patient with goals and time frames  set for  achieving them. Immunization and cancer screening needs are specifically addressed at this visit.   Dyslipidemia (high LDL; low HDL) Improved Hyperlipidemia:Low fat diet discussed and encouraged.   Lipid Panel  Lab Results  Component Value Date   CHOL 177 09/08/2024   HDL 43 09/08/2024   LDLCALC 109 (H) 09/08/2024   TRIG 143 09/08/2024   CHOLHDL 4.1 09/08/2024       Morbid obesity (HCC) Deteriorated  Patient re-educated about  the importance of commitment to a  minimum of 150 minutes of exercise per week as able.  The importance of healthy food choices with portion control discussed, as well as eating regularly and within a 12 hour window most days. The need to choose clean , green food 50 to 75% of the time is discussed, as well as to make water the primary drink and set a goal of 64 ounces water daily.       09/29/2024    2:54 PM 06/02/2024   11:15 PM 06/02/2024    3:32 PM  Weight /BMI  Weight 219 lb 1.9 oz 207 lb 212 lb  Height 5' 6 (1.676 m) 5' 6 (1.676 m) 5' 6 (1.676 m)  BMI 35.37 kg/m2 33.41 kg/m2 34.22 kg/m2      OSA (obstructive sleep apnea) Reported at visit she deos not hav sleep apnea, or dioes not believe she does , has not followed up on recommendation by Cardiology on record review ion 2024, will re address at next visit  Malignant hypertension Uncontrolled at  visit Lifestyle changes only no med change, check twice weekly at home and bring cuff to next visit DASH diet and commitment to daily physical activity for a minimum of 30 minutes discussed and encouraged, as a part of hypertension management. The importance of attaining a healthy weight is also discussed.     09/29/2024    3:39 PM 09/29/2024    3:38 PM 09/29/2024    2:54 PM 09/29/2024    1:17 PM 09/29/2024    1:12 PM 06/03/2024   12:15 AM 06/02/2024   11:30 PM  BP/Weight  Systolic BP 148 148 146 133 159 145 125  Diastolic BP 76 76 85 84 90 84 77  Wt. (Lbs)   219.12      BMI    35.37 kg/m2           Immunization declined Encouraged and re educated regarding need for influenza and covid vacxcines, declines both

## 2024-09-30 ENCOUNTER — Encounter: Payer: Self-pay | Admitting: Oncology

## 2024-10-11 ENCOUNTER — Other Ambulatory Visit: Payer: Self-pay | Admitting: Internal Medicine

## 2024-10-11 DIAGNOSIS — E559 Vitamin D deficiency, unspecified: Secondary | ICD-10-CM

## 2024-10-27 ENCOUNTER — Encounter: Payer: Self-pay | Admitting: *Deleted

## 2024-10-27 ENCOUNTER — Other Ambulatory Visit: Payer: Self-pay

## 2024-10-27 ENCOUNTER — Other Ambulatory Visit: Payer: Self-pay | Admitting: Family Medicine

## 2024-10-27 ENCOUNTER — Telehealth: Payer: Self-pay | Admitting: *Deleted

## 2024-10-27 DIAGNOSIS — C9210 Chronic myeloid leukemia, BCR/ABL-positive, not having achieved remission: Secondary | ICD-10-CM

## 2024-10-27 DIAGNOSIS — I10 Essential (primary) hypertension: Secondary | ICD-10-CM

## 2024-10-27 MED ORDER — DASATINIB 70 MG PO TABS: ORAL_TABLET | ORAL | 5 refills | Status: AC

## 2024-10-27 NOTE — Telephone Encounter (Signed)
 Chart reviewed. Sprycel  refilled per last office note with Delon Hope, NP.

## 2024-10-27 NOTE — Telephone Encounter (Signed)
 Per patient, she had a discussion with Delon Hope, NP who advised no additional labs would be due until February visit.  Requisitions mailed to her home address.

## 2024-11-09 ENCOUNTER — Other Ambulatory Visit: Payer: Self-pay | Admitting: Family Medicine

## 2024-11-09 ENCOUNTER — Encounter: Payer: Self-pay | Admitting: *Deleted

## 2024-12-15 ENCOUNTER — Ambulatory Visit: Payer: Self-pay | Admitting: Family Medicine

## 2024-12-15 LAB — BMP8+EGFR
BUN/Creatinine Ratio: 15 (ref 9–23)
BUN: 17 mg/dL (ref 6–24)
CO2: 23 mmol/L (ref 20–29)
Calcium: 9 mg/dL (ref 8.7–10.2)
Chloride: 106 mmol/L (ref 96–106)
Creatinine, Ser: 1.11 mg/dL — ABNORMAL HIGH (ref 0.57–1.00)
Glucose: 85 mg/dL (ref 70–99)
Potassium: 4.8 mmol/L (ref 3.5–5.2)
Sodium: 140 mmol/L (ref 134–144)
eGFR: 61 mL/min/{1.73_m2}

## 2024-12-21 ENCOUNTER — Ambulatory Visit (HOSPITAL_COMMUNITY)

## 2025-01-01 ENCOUNTER — Inpatient Hospital Stay: Admitting: Oncology

## 2025-01-01 ENCOUNTER — Inpatient Hospital Stay: Attending: Oncology | Admitting: Oncology

## 2025-01-26 ENCOUNTER — Ambulatory Visit: Admitting: Family Medicine
